# Patient Record
Sex: Female | Born: 1960
Health system: Southern US, Community
[De-identification: ages and names within clinical notes are randomized; demographics above are authoritative.]

## PROBLEM LIST (undated history)

## (undated) DIAGNOSIS — N200 Calculus of kidney: Secondary | ICD-10-CM

## (undated) DIAGNOSIS — I10 Essential (primary) hypertension: Secondary | ICD-10-CM

## (undated) HISTORY — PX: UTERINE FIBROID EMBOLIZATION: SHX825

---

## 1997-11-17 ENCOUNTER — Other Ambulatory Visit: Admission: RE | Admit: 1997-11-17 | Discharge: 1997-11-17 | Payer: Self-pay | Admitting: Obstetrics and Gynecology

## 1998-06-17 ENCOUNTER — Observation Stay (HOSPITAL_COMMUNITY): Admission: EM | Admit: 1998-06-17 | Discharge: 1998-06-19 | Payer: Self-pay | Admitting: Emergency Medicine

## 1998-06-17 ENCOUNTER — Encounter: Payer: Self-pay | Admitting: Urology

## 1999-01-13 ENCOUNTER — Other Ambulatory Visit: Admission: RE | Admit: 1999-01-13 | Discharge: 1999-01-13 | Payer: Self-pay | Admitting: Obstetrics and Gynecology

## 1999-07-19 ENCOUNTER — Inpatient Hospital Stay (HOSPITAL_COMMUNITY): Admission: RE | Admit: 1999-07-19 | Discharge: 1999-07-20 | Payer: Self-pay | Admitting: Orthopedic Surgery

## 1999-07-19 ENCOUNTER — Encounter: Payer: Self-pay | Admitting: Orthopedic Surgery

## 2000-02-16 ENCOUNTER — Observation Stay (HOSPITAL_COMMUNITY): Admission: AD | Admit: 2000-02-16 | Discharge: 2000-02-16 | Payer: Self-pay | Admitting: *Deleted

## 2000-02-28 ENCOUNTER — Observation Stay (HOSPITAL_COMMUNITY): Admission: AD | Admit: 2000-02-28 | Discharge: 2000-02-28 | Payer: Self-pay | Admitting: Obstetrics and Gynecology

## 2000-03-08 ENCOUNTER — Other Ambulatory Visit: Admission: RE | Admit: 2000-03-08 | Discharge: 2000-03-08 | Payer: Self-pay | Admitting: Obstetrics and Gynecology

## 2000-06-29 ENCOUNTER — Inpatient Hospital Stay (HOSPITAL_COMMUNITY): Admission: AD | Admit: 2000-06-29 | Discharge: 2000-06-29 | Payer: Self-pay | Admitting: Obstetrics and Gynecology

## 2000-07-13 ENCOUNTER — Ambulatory Visit (HOSPITAL_COMMUNITY): Admission: RE | Admit: 2000-07-13 | Discharge: 2000-07-13 | Payer: Self-pay | Admitting: Obstetrics and Gynecology

## 2000-09-21 ENCOUNTER — Inpatient Hospital Stay (HOSPITAL_COMMUNITY): Admission: AD | Admit: 2000-09-21 | Discharge: 2000-09-21 | Payer: Self-pay | Admitting: Obstetrics and Gynecology

## 2000-09-27 ENCOUNTER — Inpatient Hospital Stay (HOSPITAL_COMMUNITY): Admission: AD | Admit: 2000-09-27 | Discharge: 2000-09-30 | Payer: Self-pay | Admitting: Obstetrics and Gynecology

## 2000-09-27 ENCOUNTER — Encounter (INDEPENDENT_AMBULATORY_CARE_PROVIDER_SITE_OTHER): Payer: Self-pay

## 2000-10-02 ENCOUNTER — Encounter: Admission: RE | Admit: 2000-10-02 | Discharge: 2000-11-01 | Payer: Self-pay | Admitting: Obstetrics and Gynecology

## 2000-10-26 ENCOUNTER — Other Ambulatory Visit: Admission: RE | Admit: 2000-10-26 | Discharge: 2000-10-26 | Payer: Self-pay | Admitting: Obstetrics and Gynecology

## 2001-11-02 ENCOUNTER — Other Ambulatory Visit: Admission: RE | Admit: 2001-11-02 | Discharge: 2001-11-02 | Payer: Self-pay | Admitting: Obstetrics and Gynecology

## 2003-02-27 ENCOUNTER — Other Ambulatory Visit: Admission: RE | Admit: 2003-02-27 | Discharge: 2003-02-27 | Payer: Self-pay | Admitting: Obstetrics and Gynecology

## 2004-04-19 ENCOUNTER — Other Ambulatory Visit: Admission: RE | Admit: 2004-04-19 | Discharge: 2004-04-19 | Payer: Self-pay | Admitting: Obstetrics and Gynecology

## 2004-08-02 ENCOUNTER — Observation Stay (HOSPITAL_COMMUNITY): Admission: RE | Admit: 2004-08-02 | Discharge: 2004-08-03 | Payer: Self-pay | Admitting: Urology

## 2004-08-02 ENCOUNTER — Emergency Department (HOSPITAL_COMMUNITY): Admission: EM | Admit: 2004-08-02 | Discharge: 2004-08-02 | Payer: Self-pay | Admitting: Emergency Medicine

## 2005-06-20 ENCOUNTER — Ambulatory Visit: Payer: Self-pay | Admitting: Infectious Diseases

## 2005-08-01 ENCOUNTER — Ambulatory Visit: Payer: Self-pay | Admitting: Infectious Diseases

## 2005-10-03 ENCOUNTER — Ambulatory Visit (HOSPITAL_BASED_OUTPATIENT_CLINIC_OR_DEPARTMENT_OTHER): Admission: RE | Admit: 2005-10-03 | Discharge: 2005-10-03 | Payer: Self-pay | Admitting: Urology

## 2005-12-15 ENCOUNTER — Other Ambulatory Visit: Admission: RE | Admit: 2005-12-15 | Discharge: 2005-12-15 | Payer: Self-pay | Admitting: Obstetrics and Gynecology

## 2007-04-03 ENCOUNTER — Encounter (INDEPENDENT_AMBULATORY_CARE_PROVIDER_SITE_OTHER): Payer: Self-pay | Admitting: Obstetrics and Gynecology

## 2007-04-03 ENCOUNTER — Ambulatory Visit (HOSPITAL_COMMUNITY): Admission: RE | Admit: 2007-04-03 | Discharge: 2007-04-03 | Payer: Self-pay | Admitting: Obstetrics and Gynecology

## 2007-04-05 ENCOUNTER — Inpatient Hospital Stay (HOSPITAL_COMMUNITY): Admission: AD | Admit: 2007-04-05 | Discharge: 2007-04-05 | Payer: Self-pay | Admitting: Obstetrics and Gynecology

## 2011-01-11 NOTE — H&P (Signed)
Robin Wilson, Robin Wilson                 ACCOUNT NO.:  [id number]   MEDICAL RECORD NO.:  [id number]          PATIENT TYPE:  AMB   LOCATION:                                FACILITY:  [facility]   PHYSICIAN:  Guy Sandifer. Henderson Cloud, M.D. DATE OF BIRTH:  1961-08-04   DATE OF ADMISSION:  04/03/2007  DATE OF DISCHARGE:                              HISTORY & PHYSICAL   CHIEF COMPLAINT:  Heavy, painful menses.   HISTORY PRESENT ILLNESS:  This patient is a 50 year old married white  female, G2, P2, status post tubal ligation with increasingly heavy and  painful menses.  A trial of the birth control pill has been  unsuccessful.  Ultrasound, in my office, on March 27, 2007 revealed the  uterus measuring 11.5 x 4.5 x 5.9 cm with a 1.8 cm. simple cyst of the  left ovary.  After discussion of options, she is be admitted for  laparoscopy, hysteroscopy, D&C, and NovaSure endometrial ablation.  The  potential risks and complications have been discussed preoperatively.   PAST MEDICAL HISTORY:  1. Mitral valve prolapse.  2. Chronic hypertension.  3. Asthma.  4. Kidney stones.  5. Ruptured disk in 2000.   PAST SURGICAL HISTORY:  1. Tonsillectomy.  2. Wisdom tooth extraction.   OBSTETRIC HISTORY:  Cesarean section x2.   SOCIAL HISTORY:  Denies tobacco, alcohol, or drug abuse.   MEDICATIONS:  Diovan, Xyzal, vitamins, calcium, Ambien p.r.n.   ALLERGIES:  SULFA.   FAMILY HISTORY:  Chronic hypertension in mother.   REVIEW OF SYSTEMS:  NEUROLOGIC:  Denies headache.  CARDIAC:  Denies  chest pain.  PULMONARY:  Denies shortness of breath.  GI:  Denies recent  changes in bowel habits.   PHYSICAL EXAMINATION:  VITAL SIGNS:  Height 5 feet 9 inches.  Weight 121  pounds.  Blood pressure 114/70.  GENERAL:  Without thyromegaly.  LUNGS:  Clear to auscultation.  HEART:  Regular rate and rhythm.  BACK:  Without CVA tenderness.  BREASTS: Without masses or discharge.  ABDOMEN:  Soft, nontender, without masses.  PELVIC  EXAM:  Vulva, vagina, cervix without lesions.  Uterus upper  normal size mobile, nontender.  She is tender over the uterosacral  ligaments with no nodularity palpable.  Adnexa nontender without masses.  EXTREMITIES:  Grossly within normal limits.  NEUROLOGIC EXAM: Grossly within normal limits.   ASSESSMENT:  Menorrhagia/dysmenorrhea.   PLAN:  Laparoscopy, hysteroscopy, D&C and NovaSure endometrial ablation.      Guy Sandifer Henderson Cloud, M.D.  Electronically Signed     JET/MEDQ  D:  03/27/2007  T:  03/27/2007  Job:  [id number]

## 2011-01-11 NOTE — Op Note (Signed)
NAMELADAVIA, Robin Wilson                 ACCOUNT NO.:  [id number]   MEDICAL RECORD NO.:  [id number]          PATIENT TYPE:  AMB   LOCATION:  [facility]                           FACILITY:  [facility]   PHYSICIAN:  Guy Sandifer. Henderson Cloud, M.D. DATE OF BIRTH:  02-May-1961   DATE OF PROCEDURE:  04/03/2007  DATE OF DISCHARGE:                               OPERATIVE REPORT   PREOPERATIVE DIAGNOSIS:  Menorrhagia, dysmenorrhea.   POSTOPERATIVE DIAGNOSIS:  Menorrhagia, dysmenorrhea.   PROCEDURE:  NovaSure endometrial ablation, hysteroscopy, dilatation and  curettage, laparoscopy, 1% Xylocaine paracervical block.   SURGEON:  Guy Sandifer. Henderson Cloud, M.D.   ANESTHESIA:  General with endotracheal intubation.   SPECIMENS:  Endometrial curettings to pathology.   ESTIMATED BLOOD LOSS:  Minimal.   IN AND OUT:  Distending media 5 mL deficit.   INDICATIONS AND CONSENT:  This patient is a 50 year old white female  status post tubal ligation with increasingly heavy and painful menses.  She is being admitted for hysteroscopy, D&C, NovaSure endometrial  ablation and laparoscopy.  Potential risks and complications have been  discussed preoperatively including but limited to infection, organ  damage, uterine perforation, bleeding requiring transfusion of blood  products with HIV and hepatitis acquisition, DVT, PE, pneumonia.  Recurrent pelvic pain and/or heavy irregular bleeding has also been  discussed.  All questions are answered and consent is signed on the  chart.   FINDINGS:  Endometrial cavity is without abnormal structure.  Fallopian  tube ostia are identified bilaterally.  Upper abdomen is grossly normal.  Uterus is 8 weeks in size.  Anterior posterior cul-de-sacs were normal.  Ovaries are normal.  Fallopian tubes status post ligation bilaterally.   PROCEDURE:  The patient taken to operating room where she is identified,  placed in dorsal supine position and general anesthesia is induced via  endotracheal intubation.   She is then placed in dorsal lithotomy  position where she is prepped abdominally and vaginally.  The bladder is  straight catheterized and she is draped in a sterile fashion.  Bivalve  speculum was placed in the vagina and the anterior cervical lip was  injected with 1% plain Xylocaine and grasped with single-tooth  tenaculum.  Paracervical block was placed at 2, 4, 5, 7, 8 and 10  o'clock positions with approximately 20 mL total of 1% plain Xylocaine.  Uterus sounds to 10 cm and the endocervical canal measures 4 cm.  Cervix  was gently progressively dilated to a 27 dilator.  Diagnostic  hysteroscope was placed in the endocervical canal and advanced under  direct visualization using distending media.  The above findings noted.  Hysteroscope was withdrawn.  Sharp curettage is carried out.  The  NovaSure endometrial ablation device is then placed.  The cavity test  was passed on the first attempt.  Ablation was then carried out.  The  device is then removed and seen to be intact.  The hysteroscope was then  replaced in the endocervical canal and again advanced under direct  visualization.  Good cautery effect throughout the cavity is noted.  No  evidence of perforations noted.  Hysteroscope is withdrawn.  The single-  tooth tenaculum was replaced with a Hulka tenaculum and attention is  turned to the abdomen.  The suprapubic and infraumbilical areas  are  injected in the midline with 0.5plain Marcaine.  A small infraumbilical  incision is then made.  A disposable Veress needle was then placed on  the first attempt without difficulty.  A normal syringe and drop test  are noted.  Two liters of gas are then insufflated under low pressure  with good tympany in the right upper quadrant.  Veress needle is removed  and a 10/11 XL bladeless disposable trocar sleeve was placed using  direct visualization with the diagnostic laparoscopic.  After placement  the operative laparoscope was placed and a  small suprapubic incision is  made in the midline and a 5-mm XL bladeless disposable trocar sleeve was  placed under direct visualization without difficulty.  The above  findings are noted.  The suprapubic trocar sleeve is then removed and no  bleeding is noted.  Pneumoperitoneum is completely reduce and the  umbilical trocar sleeve was removed.  0 Vicryl suture is used to  reapproximate the subcutaneous tissue in the umbilical incision with  care being taken not to pick up any underlying structures.  Skin is  closed on both incisions with Dermabond.  Hulka tenaculum is removed.  All counts were correct.  The patient is awakened, taken to recovery  room in stable condition.      Guy Sandifer Henderson Cloud, M.D.  Electronically Signed     JET/MEDQ  D:  04/03/2007  T:  04/03/2007  Job:  [id number]

## 2011-01-11 NOTE — H&P (Signed)
NAMEDEYANNA, Robin Wilson                 ACCOUNT NO.:  [id number]   MEDICAL RECORD NO.:  [id number]          PATIENT TYPE:  AMB   LOCATION:  [facility]C                           [facility]ILITY[facility]  [facility]   PHYSICIAN:  Guy Sandifer. Henderson Cloud, M.D. DATE OF BIRTH:  Nov 05, 1960   DATE OF ADMISSION:  04/03/2007  DATE OF DISCHARGE:                              HISTORY & PHYSICAL   CHIEF COMPLAINT:  Heavy, painful menses.   HISTORY OF PRESENT ILLNESS:  This patient is a 50 year old married white  female G2, P2, status post tubal ligation with increasingly heavy and  painful menses.  She has attempted a trial of the birth control pill  which has not been helpful.  Ultrasound in my office on March 27, 2007,  reveals uterus measuring 11.5 x 4.5 x 5.9 cm.  There is a 1.8 cm simple  cyst on the left ovary.  After a discussion of options, she is being  admitted for a laparoscopy, hysteroscopy, D&C and NovaSure endometrial  ablation.  Potential risks and complications have been discussed  preoperatively.   PAST MEDICAL HISTORY:  1. Chronic hypertension.  2. Mitral valve prolapse.  3. Asthma.  4. Ruptured disc 2000.   PAST SURGICAL HISTORY:  Dictation stopped at this point.      Guy Sandifer Henderson Cloud, M.D.  Electronically Signed     JET/MEDQ  D:  03/27/2007  T:  03/27/2007  Job:  [id number]

## 2011-01-14 NOTE — Op Note (Signed)
NAMECLIFFIE, Robin Wilson                 ACCOUNT NO.:  [id number]   MEDICAL RECORD NO.:  [id number]          PATIENT TYPE:  AMB   LOCATION:  [facility]                         FACILITY:  [facility]   PHYSICIAN:  Sigmund I. Patsi Sears, M.D.DATE OF BIRTH:  05-Jun-1961   DATE OF PROCEDURE:  10/03/2005  DATE OF DISCHARGE:                                 OPERATIVE REPORT   PREOPERATIVE DIAGNOSIS:  Impacted lower left ureteral calculus.   POSTOPERATIVE DIAGNOSIS:  Impacted lower left ureteral calculus.   OPERATION:  Cystourethroscopy, retrograde pyelogram with interpretation,  dilation of left ureter, ureteroscopy, laser fractionation of impacted left  lower ureteral calculus, and basket extraction of stone.   SURGEON:  Sigmund I. Patsi Sears, M.D.   ANESTHESIA:  General LMA.   PREPARATION:  After appropriate preanesthesia, the patient was brought to  the operating room and placed on the operating table in a dorsal supine  position, where general LMA anesthesia was introduced.  She was then  replaced in dorsal lithotomy position, and the pubis was prepped with  Betadine solution and draped in the usual fashion.   HISTORY:  This 50 year old female presents with a two-week history of left  lower ureteral colic, with known bilateral small renal stones.  CT showed a  stone in the left lower ureter, measuring 4 mm, and the patient has had  continue ureteral colic, now for extraction.   PROCEDURE:  Cystoscopy was accomplished, which showed a normal-appearing  bladder, with normal urethra.  There was no evidence of bladder stone, tumor  or bladder diverticular formation.   Retrograde pyelogram was performed on the left side, which showed a left  lower ureteral calculus, with mild hydronephrosis above it.  A guidewire was  passed in the kidney, and the ureter was dilated with a ureteral dilation  sheath.  Following this, the short 6 French ureteroscope was passed into the  ureter, the stone was identified.   It was too big to come big the ureter,  and it appeared to be three small stones which had molded together to form  one stone.  The laser was then used to fracture the stone into two parts,  and the large part was removed from the stone.  Some flakes of the stone  material were still left in the ureter.  Repeat ureteroscopy did not show  any further stone.  It was elected to not leave a double J catheter, and the  guidewire was removed.  The patient was given IV Toradol, awakened and taken  to the recovery room in good condition.      Sigmund I. Patsi Sears, M.D.  Electronically Signed     SIT/MEDQ  D:  10/03/2005  T:  10/03/2005  Job:  [id number]

## 2011-01-14 NOTE — H&P (Signed)
[facility]  Patient:    Robin Wilson, Robin Wilson                          MRN: [id number] Adm. Date:  09/27/00 Attending:  Guy Sandifer. Arleta Creek, M.D.                         History and Physical  CHIEF COMPLAINT:              Term pregnancy, for repeat cesarean section.  HISTORY OF PRESENT ILLNESS:   This patient is a 50 year old married white female, G2, P27, with an EDC of October 04, 2000, established by an ultrasound at 8-5/7 weeks.  Has had a pregnancy complicated by: 1. Advanced maternal age with a normal AFP. 2. History of cesarean section for breech presentation.  The patient has also    had repair of a ruptured disk since that time and requests repeat cesarean    section.  She is being admitted for this surgery.  PAST MEDICAL HISTORY:         See [place] form.  PAST SURGICAL HISTORY:        See [place] form.  FAMILY HISTORY:               See [place] form.  OBSTETRICAL HISTORY:          See [place] form.  MEDICATIONS:                  Prenatal vitamin.  ALLERGIES:                    No known drug allergies.  PHYSICAL EXAMINATION:  LUNGS:                        Clear to auscultation.  HEART:                        Regular rate and rhythm.  BACK:                         Without CVA tenderness.  BREASTS:                      Not examined.  ABDOMEN:                      Gravid with no epigastric tenderness.  PELVIC:                       Cervix closed, thick, and high on last examination.  EXTREMITIES:                  Trace edema.  LABORATORY DATA:              Blood type O negative with negative antibody screen.  She did receive RhoGAM.  Toxoplasmosis screen negative.  VDRL nonreactive.  Rubella titer positive.  Hepatitis B surface antigen negative. Gonorrhea and chlamydia screen negative.  ASSESSMENT:                   Term intrauterine pregnancy.  PLAN:                         Repeat cesarean section. DD:  09/26/00 TD:   09/26/00  Job: [id number]

## 2011-01-14 NOTE — Op Note (Signed)
[facility]  Patient:    Robin Wilson, Robin Wilson                        MRN: [id number] Proc. Date: 09/27/00 Adm. Date:  [id number] Attending:  Cordelia Pen Ii                           Operative Report  PREOPERATIVE DIAGNOSES:       1. Intrauterine pregnancy at term.                               2. Previous cesarean section.                               3. Status post low back surgery.  POSTOPERATIVE DIAGNOSES:      1. Intrauterine pregnancy at term.                               2. Previous cesarean section.                               3. Status post low back surgery.  PROCEDURE:                    Low transverse cesarean section with bilateral tubal ligation.  SURGEON:                      Guy Sandifer. Arleta Creek, M.D.  ASSISTANTWilley Blade, M.D.  ANESTHESIA:                   Spinal.  ANESTHESIOLOGIST:             Belva Agee, M.D.  ESTIMATED BLOOD LOSS:         600 cc.  FINDINGS:                     Viable female infant with Apgars of 8 and 9 at one and five minutes respectively.  Birth weight 8 lb 15 oz.  Arterial cord pH 7.18.  INDICATIONS AND CONSENT:      This patient is a 50 year old married white female, G2, P1, with a history of a cesarean section for breech baby.  She subsequently had a lumbar laminectomy.  After consideration of the risks of VBAC, as well as the potential risks of damaging her back again after her surgery with second stage of labor, cesarean section is scheduled.  The potential risks and complications have been discussed including but not limited to infection; bowel, bladder or ureteral damage; bleeding requiring transfusion of blood products with possible transfusion reaction, HIV and hepatitis acquisition; DVT; PE and pneumonia.  The patient also requested tubal ligation.  Permanence, failure rate and increased ectopic risks of the procedure were discussed.  All questions were  answered.  DESCRIPTION OF PROCEDURE:     The patient was taken to the operating room, where a spinal anesthetic was placed.  She was then placed in the dorsal supine position with a 15 degree left lateral wedge.  She was prepped.  A Foley catheter was placed in the bladder to drain.  She was draped in a sterile fashion.  After testing for adequate spinal anesthesia, the skin was entered through the previous Pfannenstiel scar and dissection was carried out in layers to the peritoneum.  The peritoneum was incised and extended superiorly and inferiorly.  The vesicouterine peritoneum was taken down cephalolaterally.  A bladder flap was developed and a bladder blade was placed.  The uterus was incised in a low transverse manner.  The uterine cavity was entered bluntly with a Kelly clamp.  The uterine incision was extended cephalolaterally with the fingers.  Artificial rupture of membranes with clear fluid.  The vertex was then delivered.  The oral and nasopharynx were suction.  The remainder of the infant was delivered.  The cord was clamped and cut and the infant was handed to the awaiting pediatrics team. The placenta was manually delivered.  The internal uterine contour was normal. The uterus was closed in a running locking layer of 0 Monocryl suture, which achieved good hemostasis.  The left fallopian tube was identified from the cornu to the fimbria.  A knuckle of mid ampullary fallopian tube was grasped with a Babcock clamp.  The knuckle was doubly ligated with 0 plain suture. The intervening knuckle of tissue was then resected.  Cautery was used only enough to obtain hemostasis.  A similar procedure was carried out on the right fallopian tube.  The ovaries were normal.  Irrigation was carried out.  The anterior peritoneum was then closed in a running fashion with 0 Monocryl suture, which was also used to reapproximate the pyramidalis muscle in the midline.  The anterior rectus fascia was  closed in running fashion with 0 Panacryl suture.  The skin was closed with clips.  All sponge, needle and instrument counts were correct.  The patient was transferred to the recovery room in stable condition. DD:  09/27/00 TD:  09/27/00 Job: [id number]

## 2011-01-14 NOTE — H&P (Signed)
[facility]  Patient:    Robin Wilson                         MRN: [id number] Adm. Date:  [id number] Attending:  Alinda Deem                         History and Physical  HISTORY OF PRESENT ILLNESS:  A 50 year old woman who sustained an extruded L5-S1 herniated nucleus pulposus last week with immediate onset of severe right leg and low back pain and developed muscle weakness over the first 3 days I was treating her to the point where she could no longer do a toe raise on the right side. She also lost her S1 reflex.  The diagnosis is proven by an MRI scan that was accomplished when I met her over a week ago.  My physical therapist Clelia Croft worked with her for a couple of days and was unable to centralize the pain and reported back that his opinion is that it truly was an extruded disk which of course was confirmed by the MRI scan.  She has also been treated with Percocet which has barely controlled her pain and a Medrol Dosepak which did not seem to  help much at all.  The MRI scan does show an oppressive right-sided extruded L5-S1 herniated nucleus pulposus.  Because of the severity of the pain and the muscle  weakness which did not get better over the weekend operative intervention is desired by the patient.  Risks and benefits are understood.  PAST MEDICAL HISTORY:  No allergies.  She does not smoke.  She rarely drinks. he has been admitted to the hospital twice for kidney stones in the past.  She has had a cesarean section and a ______ procedure.  No problem with anesthesia.  The only medicines she takes is as previously mentioned.  FAMILY HISTORY:  She lives with her husband here in [place] who works as an Acupuncturist for [employer].  REVIEW OF SYSTEMS:  She has a hiatal hernia which she rarely takes any medicine  for.  She also has what sounds like exertional asthma and has a Ventolin inhaler but  has not used it for the last 8 months.  PHYSICAL EXAMINATION:  GENERAL:  Well-nourished, well-developed, slender 50 year old woman.  VITAL SIGNS:  Pulse 84, blood pressure 150/80, temperature 98.4, respiratory rate 16.  HEENT:  Tympanic membranes are clear.  Pupils PERRLA.  Full extraocular range of motion.  Nose and throat clear.  NECK:  Supple.  CHEST:  Clear.  HEART:  Regular rate and rhythm.  ABDOMEN:  Abdomen is soft and nontender.  BACK:  Clinically straight.  She has paralumbar muscle spasm.  EXTREMITIES:  Positive straight leg raise on the right side.  Gastrocnemius weakness on the right at 4/5 and loss of the S1 reflex on the right side.  EHL nd anterior tibialis are intact.  She has normal pulses.  Straight leg raising positive on the right side.  ASSESSMENT:  Extruded L5-S1 herniated nucleus pulposus with muscle weakness in  50 year old woman for the last week.  PLAN:  She will be taken to the operating room for a lumbar laminotomy and resection of the herniated nucleus pulposus.  This will be accomplished as soon as the room is available today.  She is in ______. DD:  07/19/99 TD:  07/19/99 Job: [id number]

## 2011-01-14 NOTE — Discharge Summary (Signed)
[facility]  Patient:    Robin Wilson, Robin Wilson                        MRN: [id number] Adm. Date:  [id number] Disc. Date: [id number] Attending:  Cordelia Pen Ii Dictator:   Danie Chandler, R.N.                           Discharge Summary  ADMITTING DIAGNOSES:          1. Intrauterine pregnancy at term.                               2. Previous cesarean section.                               3. Status post low back surgery.  DISCHARGE DIAGNOSES:          1. Intrauterine pregnancy at term.                               2. Previous cesarean section.                               3. Status post low back surgery.  PROCEDURE:                    On September 27, 2000 low transverse cesarean section with bilateral tubal ligation.  REASON FOR ADMISSION:         Please see dictated H&P.  HOSPITAL COURSE:              The patient was taken to the operating room and underwent the above named procedure without complication.  This was productive of a viable female infant with Apgars of 8 at one minute and 9 at five minutes and an arterial cord pH of 7.19.  Postoperatively on day #1 the patients hemoglobin was 10.9, hematocrit 30.6, and white blood cell count 15.1.  She had a good return of bowel function on this day and was tolerating a regular diet.  On postoperative day #2 she was ambulating well without difficulty and had good pain control.  She was discharged home on postoperative day #3.  CONDITION ON DISCHARGE:       Good.  DIET:                         Regular, as tolerated.  ACTIVITY:                     No heavy lifting, no driving, no vaginal entry.  FOLLOW-UP:                    She is to follow-up in the office in one to two weeks for incision check.  She is to call for temperature greater than 100 degrees, persistent nausea or vomiting, heavy vaginal bleeding, and/or redness or drainage from the incision site.  DISCHARGE MEDICATIONS:        1.  Prenatal vitamin one p.o. q.d.  2. Percocet 5 mg as directed by M.D.                               3. Motrin 600 mg as directed by M.D. DD:  10/24/00 TD:  10/24/00 Job: [id number]

## 2011-06-13 LAB — COMPREHENSIVE METABOLIC PANEL
ALT: 15
AST: 22
Albumin: 4.2
Alkaline Phosphatase: 49
BUN: 11
CO2: 29
Calcium: 9.4
Chloride: 104
Creatinine, Ser: 0.51
GFR calc Af Amer: >60
GFR calc non Af Amer: >60
Glucose, Bld: 91
Potassium: 3.7
Sodium: 139
Total Bilirubin: 0.6
Total Protein: 6.4

## 2011-06-13 LAB — CBC
HCT: 37.6
Hemoglobin: 13.1
MCHC: 34.9
MCV: 100.3 — ABNORMAL HIGH
Platelets: 274
RBC: 3.75 — ABNORMAL LOW
RDW: 12.4
WBC: 4.1

## 2011-08-10 ENCOUNTER — Encounter: Payer: Self-pay | Admitting: Emergency Medicine

## 2011-08-10 ENCOUNTER — Emergency Department (HOSPITAL_COMMUNITY)
Admission: EM | Admit: 2011-08-10 | Discharge: 2011-08-10 | Discharge status: Against Medical Advice | Payer: BC Managed Care – PPO | Attending: Emergency Medicine | Admitting: Emergency Medicine

## 2011-08-10 DIAGNOSIS — R1110 Vomiting, unspecified: Secondary | ICD-10-CM | POA: Insufficient documentation

## 2011-08-10 DIAGNOSIS — R109 Unspecified abdominal pain: Secondary | ICD-10-CM | POA: Insufficient documentation

## 2011-08-10 HISTORY — DX: Essential (primary) hypertension: I10

## 2011-08-10 HISTORY — DX: Calculus of kidney: N20.0

## 2011-08-10 MED ORDER — ONDANSETRON 8 MG PO TBDP
8.0000 mg | ORAL_TABLET | Freq: Once | ORAL | Status: AC
  Administered 2011-08-10: 8 mg via ORAL
  Filled 2011-08-10: qty 1

## 2011-08-10 NOTE — ED Notes (Signed)
Pt returned to registration window asking about wait, delay explained, pt verbally abusive to [employer] tech at window, states she will not wait much longer. Apology for delay given.

## 2011-08-10 NOTE — ED Notes (Signed)
Pt returned to window and states she is leaving without being seen by MD, requesting to know name of RN who states she is being discharged. Explained to patient that she is leaving without being seen and not discharge and explained that we would be happy to see her when room is available, delay explained again, pt verbally abusive and cussing at staff. States she will be writing a letter to complain about our "poor time management". Explained to patient that hospital is full but we hoped to get her a bed soon, pt states she would rather leave. Explained to patient that care is available to her if she should return.

## 2011-08-10 NOTE — ED Notes (Signed)
Per Pt, hx of kidney stones, seen by MD Patsi Sears; onset of left flank pain today with nausea and vomiting, pain has moved to front left lower abdomen; denies urinary symptoms.

## 2012-05-14 DIAGNOSIS — L209 Atopic dermatitis, unspecified: Secondary | ICD-10-CM | POA: Insufficient documentation

## 2012-05-22 ENCOUNTER — Other Ambulatory Visit: Payer: Self-pay | Admitting: Urology

## 2012-06-01 ENCOUNTER — Other Ambulatory Visit: Payer: Self-pay | Admitting: Urology

## 2012-06-01 NOTE — Progress Notes (Signed)
PT GIVEN SAME DAY SURGERY INSTRUCTIONS-INCLUDING SHOWER WITH BETASEPT SOAP 2 NIGHTS BEFORE SURGERY AND IF SHOWERING DAY OF SURGERY--USE BETASEPT.  DO NOT USE BETASEPT ON FACE, HEAD OR PRIVATE AREAS-MAY USE NORMAL SOAP / SHAMPOO THOSE AREAS.  DO NOT SHAVE SKIN 48 HRS BEFORE USING BETASEPT TO AVOID SKIN IRRITATION.

## 2012-06-04 ENCOUNTER — Encounter (HOSPITAL_COMMUNITY): Payer: Self-pay | Admitting: *Deleted

## 2012-06-04 ENCOUNTER — Encounter (HOSPITAL_COMMUNITY): Payer: Self-pay | Admitting: Anesthesiology

## 2012-06-04 ENCOUNTER — Ambulatory Visit (HOSPITAL_COMMUNITY): Admission: RE | Admit: 2012-06-04 | Payer: BC Managed Care – PPO | Source: Ambulatory Visit | Admitting: Urology

## 2012-06-04 ENCOUNTER — Ambulatory Visit (HOSPITAL_COMMUNITY): Payer: BC Managed Care – PPO | Admitting: Anesthesiology

## 2012-06-04 ENCOUNTER — Encounter (HOSPITAL_COMMUNITY)
Admission: RE | Disposition: A | Discharge status: Home / Self Care | Payer: Self-pay | Source: Ambulatory Visit | Attending: Urology

## 2012-06-04 ENCOUNTER — Ambulatory Visit (HOSPITAL_COMMUNITY): Payer: BC Managed Care – PPO

## 2012-06-04 ENCOUNTER — Ambulatory Visit (HOSPITAL_COMMUNITY)
Admission: RE | Admit: 2012-06-04 | Discharge: 2012-06-04 | Disposition: A | Discharge status: Home / Self Care | Payer: BC Managed Care – PPO | Source: Ambulatory Visit | Attending: Urology | Admitting: Urology

## 2012-06-04 DIAGNOSIS — M949 Disorder of cartilage, unspecified: Secondary | ICD-10-CM | POA: Insufficient documentation

## 2012-06-04 DIAGNOSIS — Z79899 Other long term (current) drug therapy: Secondary | ICD-10-CM | POA: Insufficient documentation

## 2012-06-04 DIAGNOSIS — N201 Calculus of ureter: Secondary | ICD-10-CM | POA: Insufficient documentation

## 2012-06-04 DIAGNOSIS — M899 Disorder of bone, unspecified: Secondary | ICD-10-CM | POA: Insufficient documentation

## 2012-06-04 HISTORY — PX: CYSTOSCOPY/RETROGRADE/URETEROSCOPY: SHX5316

## 2012-06-04 LAB — BASIC METABOLIC PANEL
BUN: 15 mg/dL (ref 6–23)
CO2: 30 mEq/L (ref 19–32)
Calcium: 9.1 mg/dL (ref 8.4–10.5)
Chloride: 102 mEq/L (ref 96–112)
Creatinine, Ser: 0.6 mg/dL (ref 0.50–1.10)
GFR calc Af Amer: >90 mL/min (ref >90)
GFR calc non Af Amer: >90 mL/min (ref >90)
Glucose, Bld: 83 mg/dL (ref 70–99)
Potassium: 3.3 mEq/L — ABNORMAL LOW (ref 3.5–5.1)
Sodium: 142 mEq/L (ref 135–145)

## 2012-06-04 LAB — CBC
HCT: 35.1 % — ABNORMAL LOW (ref 36.0–46.0)
Hemoglobin: 12.4 g/dL (ref 12.0–15.0)
MCH: 33.8 pg (ref 26.0–34.0)
MCHC: 35.3 g/dL (ref 30.0–36.0)
MCV: 95.6 fL (ref 78.0–100.0)
Platelets: 272 10*3/uL (ref 150–400)
RBC: 3.67 MIL/uL — ABNORMAL LOW (ref 3.87–5.11)
RDW: 12.7 % (ref 11.5–15.5)
WBC: 5 10*3/uL (ref 4.0–10.5)

## 2012-06-04 LAB — SURGICAL PCR SCREEN
MRSA, PCR: POSITIVE — AB
Staphylococcus aureus: POSITIVE — AB

## 2012-06-04 SURGERY — CYSTOSCOPY/RETROGRADE/URETEROSCOPY
Anesthesia: General | Site: Ureter | Laterality: Left | Wound class: Clean Contaminated

## 2012-06-04 SURGERY — LITHOTRIPSY, ESWL
Anesthesia: LOCAL | Laterality: Left

## 2012-06-04 MED ORDER — MUPIROCIN 2 % EX OINT
TOPICAL_OINTMENT | Freq: Two times a day (BID) | CUTANEOUS | Status: DC
  Filled 2012-06-04: qty 22

## 2012-06-04 MED ORDER — HYDROMORPHONE HCL PF 1 MG/ML IJ SOLN: 0.2500 mg | INTRAMUSCULAR | Status: DC | PRN

## 2012-06-04 MED ORDER — OXYCODONE HCL 5 MG/5ML PO SOLN
5.0000 mg | Freq: Once | ORAL | Status: DC | PRN
  Filled 2012-06-04: qty 5

## 2012-06-04 MED ORDER — IOHEXOL 300 MG/ML  SOLN
INTRAMUSCULAR | Status: DC | PRN
  Administered 2012-06-04: 4 mL

## 2012-06-04 MED ORDER — OXYCODONE HCL 5 MG PO TABS: 5.0000 mg | ORAL_TABLET | Freq: Once | ORAL | Status: DC | PRN

## 2012-06-04 MED ORDER — ACETAMINOPHEN 10 MG/ML IV SOLN: 1000.0000 mg | Freq: Once | INTRAVENOUS | Status: DC | PRN

## 2012-06-04 MED ORDER — LIDOCAINE HCL (CARDIAC) 20 MG/ML IV SOLN
INTRAVENOUS | Status: DC | PRN
  Administered 2012-06-04: 50 mg via INTRAVENOUS

## 2012-06-04 MED ORDER — HYDROCODONE-ACETAMINOPHEN 7.5-650 MG PO TABS: 1.0000 | ORAL_TABLET | Freq: Four times a day (QID) | ORAL | Status: DC | PRN

## 2012-06-04 MED ORDER — EPHEDRINE SULFATE 50 MG/ML IJ SOLN
INTRAMUSCULAR | Status: DC | PRN
  Administered 2012-06-04: 5 mg via INTRAVENOUS

## 2012-06-04 MED ORDER — KETOROLAC TROMETHAMINE 30 MG/ML IJ SOLN
INTRAMUSCULAR | Status: DC | PRN
  Administered 2012-06-04: 30 mg via INTRAVENOUS

## 2012-06-04 MED ORDER — MEPERIDINE HCL 50 MG/ML IJ SOLN: 6.2500 mg | INTRAMUSCULAR | Status: DC | PRN

## 2012-06-04 MED ORDER — LACTATED RINGERS IV SOLN
INTRAVENOUS | Status: DC
  Administered 2012-06-04: 1000 mL via INTRAVENOUS

## 2012-06-04 MED ORDER — PROMETHAZINE HCL 25 MG/ML IJ SOLN: 6.2500 mg | INTRAMUSCULAR | Status: DC | PRN

## 2012-06-04 MED ORDER — LIDOCAINE HCL 2 % EX GEL
CUTANEOUS | Status: AC
  Filled 2012-06-04: qty 10

## 2012-06-04 MED ORDER — CEFAZOLIN SODIUM-DEXTROSE 2-3 GM-% IV SOLR
INTRAVENOUS | Status: AC
  Filled 2012-06-04: qty 50

## 2012-06-04 MED ORDER — CEFAZOLIN SODIUM-DEXTROSE 2-3 GM-% IV SOLR
2.0000 g | INTRAVENOUS | Status: AC
  Administered 2012-06-04: 2 g via INTRAVENOUS

## 2012-06-04 MED ORDER — CIPROFLOXACIN HCL 500 MG PO TABS: 500.0000 mg | ORAL_TABLET | Freq: Two times a day (BID) | ORAL | Status: DC

## 2012-06-04 MED ORDER — IOHEXOL 300 MG/ML  SOLN
INTRAMUSCULAR | Status: AC
  Filled 2012-06-04: qty 1

## 2012-06-04 MED ORDER — FENTANYL CITRATE 0.05 MG/ML IJ SOLN
INTRAMUSCULAR | Status: DC | PRN
  Administered 2012-06-04: 25 ug via INTRAVENOUS
  Administered 2012-06-04: 50 ug via INTRAVENOUS
  Administered 2012-06-04: 25 ug via INTRAVENOUS

## 2012-06-04 MED ORDER — PHENAZOPYRIDINE HCL 200 MG PO TABS: 200.0000 mg | ORAL_TABLET | Freq: Three times a day (TID) | ORAL | Status: DC | PRN

## 2012-06-04 MED ORDER — SCOPOLAMINE 1 MG/3DAYS TD PT72
MEDICATED_PATCH | TRANSDERMAL | Status: AC
  Filled 2012-06-04: qty 1

## 2012-06-04 MED ORDER — ACETAMINOPHEN 10 MG/ML IV SOLN
INTRAVENOUS | Status: AC
  Filled 2012-06-04: qty 100

## 2012-06-04 MED ORDER — MIDAZOLAM HCL 5 MG/5ML IJ SOLN
INTRAMUSCULAR | Status: DC | PRN
  Administered 2012-06-04: 1 mg via INTRAVENOUS

## 2012-06-04 MED ORDER — PROPOFOL 10 MG/ML IV EMUL
INTRAVENOUS | Status: DC | PRN
  Administered 2012-06-04: 30 mg via INTRAVENOUS
  Administered 2012-06-04 (×2): 50 mg via INTRAVENOUS
  Administered 2012-06-04: 120 mg via INTRAVENOUS
  Administered 2012-06-04: 50 mg via INTRAVENOUS

## 2012-06-04 MED ORDER — ACETAMINOPHEN 10 MG/ML IV SOLN
INTRAVENOUS | Status: DC | PRN
  Administered 2012-06-04: 1000 mg via INTRAVENOUS

## 2012-06-04 MED ORDER — LACTATED RINGERS IV SOLN
INTRAVENOUS | Status: DC | PRN
  Administered 2012-06-04 (×2): via INTRAVENOUS

## 2012-06-04 MED ORDER — BELLADONNA ALKALOIDS-OPIUM 16.2-60 MG RE SUPP
RECTAL | Status: AC
  Filled 2012-06-04: qty 1

## 2012-06-04 MED ORDER — SCOPOLAMINE 1 MG/3DAYS TD PT72
1.0000 | MEDICATED_PATCH | TRANSDERMAL | Status: DC
  Administered 2012-06-04: 1.5 mg via TRANSDERMAL
  Filled 2012-06-04: qty 1

## 2012-06-04 MED ORDER — ONDANSETRON HCL 4 MG/2ML IJ SOLN
INTRAMUSCULAR | Status: DC | PRN
  Administered 2012-06-04: 4 mg via INTRAVENOUS

## 2012-06-04 MED ORDER — BELLADONNA ALKALOIDS-OPIUM 16.2-60 MG RE SUPP
RECTAL | Status: DC | PRN
  Administered 2012-06-04: 1 via RECTAL

## 2012-06-04 SURGICAL SUPPLY — 23 items
ADAPTER CATH URET PLST 4-6FR (CATHETERS) ×2 IMPLANT
ADPR CATH URET STRL DISP 4-6FR (CATHETERS) ×1
BAG URO CATCHER STRL LF (DRAPE) ×2 IMPLANT
BASKET ZERO TIP NITINOL 2.4FR (BASKET) ×2 IMPLANT
BSKT STON RTRVL ZERO TP 2.4FR (BASKET) ×2
CATH CLEAR GEL 3F BACKSTOP (CATHETERS) ×2 IMPLANT
CATH INTERMIT  6FR 70CM (CATHETERS) ×2 IMPLANT
CATH URET 5FR 28IN OPEN ENDED (CATHETERS) ×1 IMPLANT
CLOTH BEACON ORANGE TIMEOUT ST (SAFETY) ×2 IMPLANT
DRAPE CAMERA CLOSED 9X96 (DRAPES) ×2 IMPLANT
GLOVE BIOGEL M STRL SZ7.5 (GLOVE) ×2 IMPLANT
GLOVE SURG SS PI 8.0 STRL IVOR (GLOVE) ×1 IMPLANT
GOWN PREVENTION PLUS XLARGE (GOWN DISPOSABLE) ×3 IMPLANT
GOWN STRL NON-REIN LRG LVL3 (GOWN DISPOSABLE) ×2 IMPLANT
GOWN STRL REIN XL XLG (GOWN DISPOSABLE) ×2 IMPLANT
GUIDEWIRE STR DUAL SENSOR (WIRE) ×2 IMPLANT
LASER FIBER DISP (UROLOGICAL SUPPLIES) ×1 IMPLANT
MANIFOLD NEPTUNE II (INSTRUMENTS) ×2 IMPLANT
MARKER SKIN DUAL TIP RULER LAB (MISCELLANEOUS) ×1 IMPLANT
PACK CYSTO (CUSTOM PROCEDURE TRAY) ×2 IMPLANT
SCRUB PCMX 4 OZ (MISCELLANEOUS) ×2 IMPLANT
SYRINGE IRR TOOMEY STRL 70CC (SYRINGE) ×1 IMPLANT
TUBING CONNECTING 10 (TUBING) ×2 IMPLANT

## 2012-06-04 NOTE — Progress Notes (Signed)
PACU Nursing Note: Pt fully alert and oriented x 4, able to ambulate to bathroom w/o assistance, pt denies any pain or aching, some urgency. DC instructions review w/ pt and significant other, pt teaching done (1) Rx's-safety while taking pain med, (2) Rx- importance of completing ABX, (3) When to call MD (4) Post diet/fluid intake after arriving home, (5) Importance of hand washing, (6) resting for 24 hours per recommendation of Anesthesia Department. Pt and significant other given opportunity for questions throughout dc instructions given. Pt able to state back significant instructions and again opportunity for questions again provided. Pt placed in wheelchair and escorted to exit with PACU RN. Assisted into private vehicle.

## 2012-06-04 NOTE — Anesthesia Preprocedure Evaluation (Addendum)
Anesthesia Evaluation  Patient identified by MRN, date of birth, ID band Patient awake    Reviewed: Allergy & Precautions, H&P , NPO status , Patient's Chart, lab work & pertinent test results  Airway Mallampati: II TM Distance: >3 FB Neck ROM: Full    Dental  (+) Teeth Intact and Dental Advisory Given   Pulmonary asthma ,  breath sounds clear to auscultation- rhonchi  Pulmonary exam normal       Cardiovascular Exercise Tolerance: Good hypertension, Pt. on medications - CAD, - Past MI and - CHF Rhythm:Regular Rate:Normal     Neuro/Psych Depression negative neurological ROS     GI/Hepatic negative GI ROS, Neg liver ROS,   Endo/Other  negative endocrine ROSneg diabetes  Renal/GU Renal disease     Musculoskeletal negative musculoskeletal ROS (+)   Abdominal   Peds  Hematology negative hematology ROS (+)   Anesthesia Other Findings   Reproductive/Obstetrics                          Anesthesia Physical Anesthesia Plan  ASA: II  Anesthesia Plan: General   Post-op Pain Management:    Induction: Intravenous  Airway Management Planned: LMA  Additional Equipment:   Intra-op Plan:   Post-operative Plan: Extubation in OR  Informed Consent: I have reviewed the patients History and Physical, chart, labs and discussed the procedure including the risks, benefits and alternatives for the proposed anesthesia with the patient or authorized representative who has indicated his/her understanding and acceptance.   Dental advisory given  Plan Discussed with: CRNA  Anesthesia Plan Comments:        Anesthesia Quick Evaluation

## 2012-06-04 NOTE — Anesthesia Postprocedure Evaluation (Signed)
Anesthesia Post Note  Patient: Robin Wilson  Procedure(s) Performed: Procedure(s) (LRB): CYSTOSCOPY/RETROGRADE/URETEROSCOPY (Left) HOLMIUM LASER APPLICATION (Left) CYSTOSCOPY WITH STENT PLACEMENT (Left)  Anesthesia type: General  Patient location: PACU  Post pain: Pain level controlled  Post assessment: Post-op Vital signs reviewed  Last Vitals: BP 136/84  Pulse 93  Temp 36.7 C (Oral)  Resp 18  Ht 5\' 10"  (1.778 m)  Wt 132 lb 6 oz (60.045 kg)  BMI 18.99 kg/m2  SpO2 96%  Post vital signs: Reviewed  Level of consciousness: sedated  Complications: No apparent anesthesia complications

## 2012-06-04 NOTE — Progress Notes (Signed)
PACU Nursing Note: Pt alert and oriented x 4, mae x 4, tolerating PO liquids well, pt assisted to bathroom, having urgency, gait noted to be very steady, required min assistance, vital signs stable, voices no complaints

## 2012-06-04 NOTE — H&P (Signed)
cc: Dr. Clyde Canterbury   Reason For Visit  2 week f/u & KUB   Active Problems Problems  1. Mid Ureteral Stone On The Left 592.1 2. Nephrolithiasis 592.0  History of Present Illness       51 y/o white female returns today for a 2 week f/u & KUB for hx of Lt ureteral stone.  She is scheduled for Lt ESWL on 06/04/12.    She was last seen by Marcello Fennel, PA on 05/15/12 for acute evaluation of left flank pain.  She reports sudden onset of left flank pain with associated N/V 05/04/12.  She had some Toradol tablets and Percocet on hand which she took with moderate relief.  The left flank pain has remained intermittent since that time.  She had another attack of severe left flank pain with associated N/V last night which prompted today's visit.  She denies gross hematuria, dysuria, fever or chills.  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  She has not passed a stone to her knowledge.  The pain does not radiate into the LEs or bottocks.  The pain is unchanged with position or activities.  She has not had recent low back injury/trauma.    She was recently treated for a left distal ureteral stone 07/2011 documented on KUB.  She never actually saw the stone pass but had not had further flank pain.  She was without complaints at her f/u visit and urinalysis was clear and KUB failed to demonstrate any obvious persistent calculi over the course of the distal ureter so it was assumed that her stone had passed.  Her last previous stone passed in 2010.  She has calcium oxalate stones.   Past Medical History Problems  1. History of  Distal Ureteral Stone On The Left 592.1 2. History of  Nephrolithiasis V13.01 3. History of  Osteopenia 733.90  Surgical History Problems  1. History of  Back Surgery 2. History of  Cystoscopy With Ureteroscopy With Manipulation Of Calculus 3. History of  Lithotripsy  Current Meds 1. Beelith 362-20 MG Oral Tablet; Take 1 tablet  daily; Therapy: 04Apr2013 to (Evaluate:30Mar2014)   Requested for: 04Apr2013; Last Rx:04Apr2013 2. Calcium + D TABS; Therapy: (Recorded:28Mar2008) to 3. Folic Acid 1 MG Oral Tablet; Therapy: 16Sep2013 to 4. HydrOXYzine HCl 25 MG Oral Tablet; Therapy: 24Sep2012 to 5. Losartan Potassium-HCTZ 100-25 MG Oral Tablet; Therapy: 05Sep2013 to 6. Multi-Vitamin TABS; Therapy: (Recorded:28Mar2008) to 7. Ondansetron 8 MG Oral Tablet Dispersible; TAKE 1 TABLET Every 6 hours PRN nausea; Therapy:  17Sep2013 to (Last Rx:17Sep2013)  Requested for: 17Sep2013 8. Oxycodone-Acetaminophen 5-325 MG Oral Tablet; take 1 or 2 tablets q 4-6 hours prn pain;  Therapy: 17Sep2013 to (Last Rx:17Sep2013) 9. Rapaflo 8 MG Oral Capsule; TAKE 1 CAPSULE Daily to facilitate stone passage; Therapy:  17Sep2013 to (Evaluate:17Oct2013); Last Rx:17Sep2013 10. Sertraline HCl 100 MG Oral Tablet; Therapy: 26Sep2012 to 11. Urocit-K 15 15 MEQ (1620 MG) Oral Tablet Extended Release; Take 1 tablet twice daily; Therapy:   04Jan2013 to (Evaluate:03Jul2013)  Requested for: 04Jan2013; Last Rx:04Jan2013  Allergies Medication  1. Sulfa Drugs  Family History Problems  1. Maternal history of  Arteriosclerotic Cardiovascular Disease (ASCVD) 2. Family history of  Family Health Status Number Of Children 2 boys 3. Paternal history of  Stroke Syndrome V17.1  Social History Problems  1. Caffeine Use 2. Marital History - Currently Married 3. Marital History - Widowed 4. Never A Smoker 5. Occupation:  electrial enger.  Review of Systems Genitourinary, constitutional, skin, eye, otolaryngeal, hematologic/lymphatic, cardiovascular, pulmonary, endocrine, musculoskeletal, gastrointestinal, neurological and psychiatric system(s) were reviewed and pertinent findings if present are noted.  Genitourinary: hematuria.  Gastrointestinal: nausea, vomiting and flank pain.    Vitals Vital Signs [Data Includes: Last 1 Day]  01Oct2013 09:18AM  Blood  Pressure: 131 / 78 Temperature: 97.7 F Heart Rate: 73  Physical Exam Abdomen: The abdomen is flat. The abdomen is soft and nontender. No masses are palpated. No CVA tenderness. No hernias are palpable. No hepatosplenomegaly noted.    Results/Data Urine [Data Includes: Last 1 Day]   01Oct2013  COLOR YELLOW   APPEARANCE CLEAR   SPECIFIC GRAVITY 1.020   pH 6.0   GLUCOSE NEG mg/dL  BILIRUBIN NEG   KETONE NEG mg/dL  BLOOD MOD   PROTEIN NEG mg/dL  UROBILINOGEN 0.2 mg/dL  NITRITE NEG   LEUKOCYTE ESTERASE NEG   SQUAMOUS EPITHELIAL/HPF RARE   WBC 0-2 WBC/hpf  RBC 7-10 RBC/hpf  BACTERIA RARE   CRYSTALS NONE SEEN   CASTS NONE SEEN    Procedure  KUB: Bilateral renal stones, No L ureteral stone identified.     Assessment Assessed  1. Nephrolithiasis 592.0 2. Mid Ureteral Stone On The Left 592.1 3. Microscopic Hematuria 599.72   Pt is scheduled for lithotripsy Monday evening, however, no L stone is seen . Her CT shows 7mm L mid-ureteral stone, and she will be better off with ureteroscopy and laser fragmentation of stone and JJ.   Plan Health Maintenance (V70.0)  1. UA With REFLEX  Done: 01Oct2013 08:50AM   Cancel lithotripsy and schedule cysto and ureteroscopy and laser of stone. She will need litholink during post op course.   Signatures Electronically signed by : Jethro Bolus, M.D.; May 29 2012 11:08AM

## 2012-06-04 NOTE — Interval H&P Note (Signed)
History and Physical Interval Note:  06/04/2012 5:53 PM  Robin Wilson  has presented today for surgery, with the diagnosis of Left Mid Ureteral Stone  The various methods of treatment have been discussed with the patient and family. After consideration of risks, benefits and other options for treatment, the patient has consented to  Procedure(s) (LRB) with comments: CYSTOSCOPY/RETROGRADE/URETEROSCOPY (Left) HOLMIUM LASER APPLICATION (Left) CYSTOSCOPY WITH STENT PLACEMENT (Left) as a surgical intervention .  The patient's history has been reviewed, patient examined, no change in status, stable for surgery.  I have reviewed the patient's chart and labs.  Questions were answered to the patient's satisfaction.     Jethro Bolus I

## 2012-06-04 NOTE — Transfer of Care (Signed)
Immediate Anesthesia Transfer of Care Note  Patient: Robin Wilson  Procedure(s) Performed: Procedure(s) (LRB) with comments: CYSTOSCOPY/RETROGRADE/URETEROSCOPY (Left) HOLMIUM LASER APPLICATION (Left) CYSTOSCOPY WITH STENT PLACEMENT (Left)  Patient Location: PACU  Anesthesia Type: General  Level of Consciousness: sedated  Airway & Oxygen Therapy: Patient Spontanous Breathing and Patient connected to face mask oxygen  Post-op Assessment: Report given to PACU RN and Post -op Vital signs reviewed and stable  Post vital signs: Reviewed and stable  Complications: No apparent anesthesia complications

## 2012-06-04 NOTE — Op Note (Signed)
Pre-operative diagnosis : Distal left ureteral calculus, nonprogression  Postoperative diagnosis: Distal left ureteral calculus x2, nonprogression  Operation: Cystourethroscopy, left retrograde PolyGram interpretation, left ureteroscopy, basket extraction of left distal ureteral calculus, laser fractionation of left ureteral stone, with backstop placement. Left double-J stent (6 [place] by 24 cm).  Surgeon:  Kathie Rhodes. Patsi Sears, MD  First assistant: None  [id number]}  Preparation: After appropriate preanesthesia, the patient was brought to the operating room, placed on the operating table in the dorsal supine position where general LMA anesthesia was introduced. Armband was double checked. The left arm was marked. She was replaced in dorsal lithotomy position with pubis was prepped with Betadine solution and draped in usual fashion.  Review history:Active Problems  Problems  1. Mid Ureteral Stone On The Left 592.1  2. Nephrolithiasis 592.0  History of Present Illness  51 y/o white female returns today for a 2 week f/u & KUB for hx of Lt ureteral stone. She is scheduled for Lt ESWL on 06/04/12.  She was last seen by Marcello Fennel, PA on 05/15/12 for acute evaluation of left flank pain. She reports sudden onset of left flank pain with associated N/V 05/04/12. She had some Toradol tablets and Percocet on hand which she took with moderate relief. The left flank pain has remained intermittent since that time. She had another attack of severe left flank pain with associated N/V last night which prompted today's visit. She denies gross hematuria, dysuria, fever or chills. 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 She has not passed a stone to her knowledge. The pain does not radiate into the LEs or bottocks. The pain is unchanged with position or activities. She has not had recent low back injury/trauma.  She was recently treated for a left distal  ureteral stone 07/2011 documented on KUB. She never actually saw the stone pass but had not had further flank pain. She was without complaints at her f/u visit and urinalysis was clear and KUB failed to demonstrate any obvious persistent calculi over the course of the distal ureter so it was assumed that her stone had passed. Her last previous stone passed in 2010. She has calcium oxalate stones.      Statement of  Likelihood of Success: Excellent. TIME-OUT observed.:  Procedure: Cystourethroscopy was accomplished, which shows normal-appearing bladder base, with normal trigone. Clear urine was seen from the right ureteral orifice. The left ureteral orifice was in normal position on the trigone. Retrograde pyelogram showed no ureteral dilation, and no definite stone could be identified, even under magnification. Ureteroscopy was accomplished with the 6 French short ureteroscope, into lower ureteral calculi were identified, one on top of the other in the distal ureter. Using a 4 wire basket, the distal stone was basket extracted. The basket was placed around the more proximal stone, but the stone could not be manipulated through the intramural tunnel. Therefore, a clamp was placed around the basket, and the basket was cut. The 6 French ureteroscope was replaced, and backstop gel was placed above the stone. The 200  fiber laser was then placed through the ureteroscope, and the stone fragmented. Pieces were then basket extracted. The previously cut basket was removed. All of the tines were catheter for and removed. The previously placed guidewire into the renal pelvis was left in place, and over this, a 6 [place] by 24 cm double-J stent was placed without difficulty, and under fluoroscopic control. The patient received IV  Tylenol, IV Toradol. She was awakened, taken to recovery room in good condition.

## 2012-06-05 ENCOUNTER — Encounter (HOSPITAL_COMMUNITY): Payer: Self-pay | Admitting: Urology

## 2012-10-02 DIAGNOSIS — H524 Presbyopia: Secondary | ICD-10-CM | POA: Insufficient documentation

## 2012-10-02 DIAGNOSIS — H5210 Myopia, unspecified eye: Secondary | ICD-10-CM | POA: Insufficient documentation

## 2012-12-19 DIAGNOSIS — Z79899 Other long term (current) drug therapy: Secondary | ICD-10-CM | POA: Insufficient documentation

## 2013-01-03 DIAGNOSIS — M7710 Lateral epicondylitis, unspecified elbow: Secondary | ICD-10-CM | POA: Insufficient documentation

## 2013-05-24 ENCOUNTER — Emergency Department (HOSPITAL_COMMUNITY): Payer: BC Managed Care – PPO

## 2013-05-24 ENCOUNTER — Emergency Department (HOSPITAL_COMMUNITY)
Admission: EM | Admit: 2013-05-24 | Discharge: 2013-05-24 | Disposition: A | Discharge status: Home / Self Care | Payer: BC Managed Care – PPO | Attending: Emergency Medicine | Admitting: Emergency Medicine

## 2013-05-24 ENCOUNTER — Encounter (HOSPITAL_COMMUNITY): Payer: Self-pay | Admitting: Adult Health

## 2013-05-24 DIAGNOSIS — I10 Essential (primary) hypertension: Secondary | ICD-10-CM | POA: Insufficient documentation

## 2013-05-24 DIAGNOSIS — Z79899 Other long term (current) drug therapy: Secondary | ICD-10-CM | POA: Insufficient documentation

## 2013-05-24 DIAGNOSIS — R5381 Other malaise: Secondary | ICD-10-CM | POA: Insufficient documentation

## 2013-05-24 DIAGNOSIS — R110 Nausea: Secondary | ICD-10-CM | POA: Insufficient documentation

## 2013-05-24 DIAGNOSIS — N390 Urinary tract infection, site not specified: Secondary | ICD-10-CM

## 2013-05-24 DIAGNOSIS — Z87442 Personal history of urinary calculi: Secondary | ICD-10-CM | POA: Insufficient documentation

## 2013-05-24 DIAGNOSIS — R0789 Other chest pain: Secondary | ICD-10-CM

## 2013-05-24 DIAGNOSIS — R002 Palpitations: Secondary | ICD-10-CM | POA: Insufficient documentation

## 2013-05-24 DIAGNOSIS — R5383 Other fatigue: Secondary | ICD-10-CM | POA: Insufficient documentation

## 2013-05-24 DIAGNOSIS — R42 Dizziness and giddiness: Secondary | ICD-10-CM | POA: Insufficient documentation

## 2013-05-24 DIAGNOSIS — R0602 Shortness of breath: Secondary | ICD-10-CM | POA: Insufficient documentation

## 2013-05-24 LAB — URINALYSIS, ROUTINE W REFLEX MICROSCOPIC
Bilirubin Urine: NEGATIVE
Glucose, UA: NEGATIVE mg/dL
Ketones, ur: NEGATIVE mg/dL
Nitrite: POSITIVE — AB
Protein, ur: 30 mg/dL — AB
Specific Gravity, Urine: 1.027 (ref 1.005–1.030)
Urobilinogen, UA: 1 mg/dL (ref 0.0–1.0)
pH: 6 (ref 5.0–8.0)

## 2013-05-24 LAB — BASIC METABOLIC PANEL
BUN: 18 mg/dL (ref 6–23)
CO2: 29 mEq/L (ref 19–32)
Calcium: 9.5 mg/dL (ref 8.4–10.5)
Chloride: 103 mEq/L (ref 96–112)
Creatinine, Ser: 0.54 mg/dL (ref 0.50–1.10)
GFR calc Af Amer: >90 mL/min (ref >90)
GFR calc non Af Amer: >90 mL/min (ref >90)
Glucose, Bld: 96 mg/dL (ref 70–99)
Potassium: 3.2 mEq/L — ABNORMAL LOW (ref 3.5–5.1)
Sodium: 142 mEq/L (ref 135–145)

## 2013-05-24 LAB — URINE MICROSCOPIC-ADD ON

## 2013-05-24 LAB — TYPE AND SCREEN
ABO/RH(D): O NEG
Antibody Screen: NEGATIVE

## 2013-05-24 LAB — CBC
HCT: 30 % — ABNORMAL LOW (ref 36.0–46.0)
Hemoglobin: 7.1 g/dL — ABNORMAL LOW (ref 12.0–15.0)
MCH: 23.6 pg — ABNORMAL LOW (ref 26.0–34.0)
MCHC: 23.7 g/dL — ABNORMAL LOW (ref 30.0–36.0)
MCV: 99.7 fL (ref 78.0–100.0)
Platelets: 236 10*3/uL (ref 150–400)
RBC: 3.01 MIL/uL — ABNORMAL LOW (ref 3.87–5.11)
RDW: 13.7 % (ref 11.5–15.5)
WBC: 2.9 10*3/uL — ABNORMAL LOW (ref 4.0–10.5)

## 2013-05-24 LAB — CBC WITH DIFFERENTIAL/PLATELET
Basophils Absolute: 0 10*3/uL (ref 0.0–0.1)
Basophils Relative: 1 % (ref 0–1)
Eosinophils Absolute: 0.5 10*3/uL (ref 0.0–0.7)
Eosinophils Relative: 11 % — ABNORMAL HIGH (ref 0–5)
HCT: 30.9 % — ABNORMAL LOW (ref 36.0–46.0)
Hemoglobin: 11.1 g/dL — ABNORMAL LOW (ref 12.0–15.0)
Lymphocytes Relative: 48 % — ABNORMAL HIGH (ref 12–46)
Lymphs Abs: 2.1 10*3/uL (ref 0.7–4.0)
MCH: 35.6 pg — ABNORMAL HIGH (ref 26.0–34.0)
MCHC: 35.9 g/dL (ref 30.0–36.0)
MCV: 99 fL (ref 78.0–100.0)
Monocytes Absolute: 0.2 10*3/uL (ref 0.1–1.0)
Monocytes Relative: 5 % (ref 3–12)
Neutro Abs: 1.5 10*3/uL — ABNORMAL LOW (ref 1.7–7.7)
Neutrophils Relative %: 35 % — ABNORMAL LOW (ref 43–77)
Platelets: 255 10*3/uL (ref 150–400)
RBC: 3.12 MIL/uL — ABNORMAL LOW (ref 3.87–5.11)
RDW: 13.7 % (ref 11.5–15.5)
WBC: 4.4 10*3/uL (ref 4.0–10.5)

## 2013-05-24 LAB — OCCULT BLOOD, POC DEVICE: Fecal Occult Bld: NEGATIVE

## 2013-05-24 LAB — ABO/RH: ABO/RH(D): O NEG

## 2013-05-24 LAB — POCT I-STAT TROPONIN I
Troponin i, poc: 0 ng/mL (ref 0.00–0.08)
Troponin i, poc: 0 ng/mL (ref 0.00–0.08)

## 2013-05-24 MED ORDER — POTASSIUM CHLORIDE CRYS ER 20 MEQ PO TBCR
40.0000 meq | EXTENDED_RELEASE_TABLET | Freq: Once | ORAL | Status: AC
  Administered 2013-05-24: 40 meq via ORAL
  Filled 2013-05-24: qty 2

## 2013-05-24 MED ORDER — HYDROXYZINE HCL 10 MG PO TABS: 10.0000 mg | ORAL_TABLET | ORAL | Status: AC | PRN

## 2013-05-24 MED ORDER — ASPIRIN 325 MG PO TABS
325.0000 mg | ORAL_TABLET | ORAL | Status: AC
  Administered 2013-05-24: 325 mg via ORAL
  Filled 2013-05-24: qty 1

## 2013-05-24 MED ORDER — LORAZEPAM 2 MG/ML IJ SOLN
1.0000 mg | Freq: Once | INTRAMUSCULAR | Status: AC
  Administered 2013-05-24: 1 mg via INTRAVENOUS
  Filled 2013-05-24: qty 1

## 2013-05-24 MED ORDER — HYDROCODONE-ACETAMINOPHEN 5-325 MG PO TABS: ORAL_TABLET | ORAL | Status: DC

## 2013-05-24 MED ORDER — KETOROLAC TROMETHAMINE 30 MG/ML IJ SOLN
30.0000 mg | Freq: Once | INTRAMUSCULAR | Status: AC
  Administered 2013-05-24: 30 mg via INTRAVENOUS
  Filled 2013-05-24: qty 1

## 2013-05-24 MED ORDER — PROMETHAZINE HCL 25 MG PO TABS: 25.0000 mg | ORAL_TABLET | Freq: Four times a day (QID) | ORAL | Status: DC | PRN

## 2013-05-24 MED ORDER — MORPHINE SULFATE 4 MG/ML IJ SOLN
4.0000 mg | Freq: Once | INTRAMUSCULAR | Status: AC
  Administered 2013-05-24: 4 mg via INTRAVENOUS
  Filled 2013-05-24: qty 1

## 2013-05-24 MED ORDER — CEPHALEXIN 500 MG PO CAPS: 500.0000 mg | ORAL_CAPSULE | Freq: Two times a day (BID) | ORAL | Status: DC

## 2013-05-24 MED ORDER — CEFTRIAXONE SODIUM 1 G IJ SOLR
1.0000 g | Freq: Once | INTRAMUSCULAR | Status: AC
  Administered 2013-05-24: 1 g via INTRAVENOUS
  Filled 2013-05-24: qty 10

## 2013-05-24 NOTE — ED Provider Notes (Signed)
CSN: [id number]     Arrival date & time 05/24/13  1658 History   First MD Initiated Contact with Patient 05/24/13 1830     Chief Complaint  Patient presents with  . Chest Pain   (Consider location/radiation/quality/duration/timing/severity/associated sxs/prior Treatment) HPI  Robin Wilson is a 52 y.o. female complaining of pressure-like chest pain onset this a.m. rated at 8/10 associated with shortness of breath, weakness, lightheaded sensation, palpitations and nausea. The pressure woke her from sleep. Patient denies fever, cough, history of DVT or PE, exogenous estrogen, recent travel or immobilization, calf swelling or leg pain. Patient denies hematochezia but endorses a darker than normal stool. She is unsure when this started. She is unsure if it is blackish or tarry. Patient sent from her primary care physician earlier in the day. States that she has never been a smoker, has past medical history significant for hypertension. Patient had a ablation and does not menstruate. No family history of colorectal cancer however her father had unexplained bouts of anemia. Patient started methotrexate for eczema 4 months ago.  Past Medical History  Diagnosis Date  . Kidney stones   . Hypertension   . Kidney stones    Past Surgical History  Procedure Laterality Date  . Uterine fibroid embolization    . Cesarean section    . Cystoscopy/retrograde/ureteroscopy  06/04/2012    Procedure: CYSTOSCOPY/RETROGRADE/URETEROSCOPY;  Surgeon: Kathi Ludwig, MD;  Location: [facility];  Service: Urology;  Laterality: Left;   History reviewed. No pertinent family history. History  Substance Use Topics  . Smoking status: Never Smoker   . Smokeless tobacco: Not on file  . Alcohol Use: No   OB History   Grav Para Term Preterm Abortions TAB SAB Ect Mult Living                 Review of Systems 10 systems reviewed and found to be negative, except as noted in the HPI   Allergies  Sulfa drugs cross  reactors  Home Medications   Current Outpatient Rx  Name  Route  Sig  Dispense  Refill  . calcium carbonate (OS-CAL - DOSED IN MG OF ELEMENTAL CALCIUM) 1250 MG tablet   Oral   Take 1 tablet by mouth daily.           . cetirizine (ZYRTEC) 10 MG tablet   Oral   Take 10 mg by mouth daily.          Marland Kitchen losartan-hydrochlorothiazide (HYZAAR) 100-25 MG per tablet   Oral   Take 1 tablet by mouth daily.          . methotrexate 2.5 MG tablet   Oral   Take 15 mg by mouth once a week. sunday         . sertraline (ZOLOFT) 100 MG tablet   Oral   Take 100 mg by mouth daily. In evening         . hydrocodone-acetaminophen (LORCET PLUS) 7.5-650 MG per tablet   Oral   Take 1 tablet by mouth every 6 (six) hours as needed for pain.   30 tablet   0   . Multiple Vitamins-Minerals (MULTIVITAMIN PO)   Oral   Take 1 tablet by mouth daily.           BP 135/73  Pulse 78  Temp(Src) 98.4 F (36.9 C) (Oral)  Resp 18  Ht 5\' 10"  (1.778 m)  Wt 141 lb 8 oz (64.184 kg)  BMI 20.3 kg/m2  SpO2 98%  Physical Exam  Nursing note and vitals reviewed. Constitutional: She is oriented to person, place, and time. She appears well-developed and well-nourished. No distress.  HENT:  Head: Normocephalic.  Mouth/Throat: Oropharynx is clear and moist.  Mild conjunctival pallor  Eyes: Conjunctivae and EOM are normal. Pupils are equal, round, and reactive to light.  Cardiovascular: Normal rate, regular rhythm and intact distal pulses.   Pulmonary/Chest: Effort normal and breath sounds normal. No stridor. No respiratory distress. She has no wheezes. She has no rales.  Abdominal: Soft. Bowel sounds are normal. She exhibits no distension and no mass. There is no tenderness. There is no rebound and no guarding.  Musculoskeletal: Normal range of motion.  Neurological: She is alert and oriented to person, place, and time.  Psychiatric: She has a normal mood and affect.    ED Course  Procedures (including  critical care time) Labs Review Labs Reviewed  CBC - Abnormal; Notable for the following:    WBC 2.9 (*)    RBC 3.01 (*)    Hemoglobin 7.1 (*)    HCT 30.0 (*)    MCH 23.6 (*)    MCHC 23.7 (*)    All other components within normal limits  BASIC METABOLIC PANEL - Abnormal; Notable for the following:    Potassium 3.2 (*)    All other components within normal limits  URINALYSIS, ROUTINE W REFLEX MICROSCOPIC - Abnormal; Notable for the following:    APPearance CLOUDY (*)    Hgb urine dipstick SMALL (*)    Protein, ur 30 (*)    Nitrite POSITIVE (*)    Leukocytes, UA LARGE (*)    All other components within normal limits  CBC WITH DIFFERENTIAL - Abnormal; Notable for the following:    RBC 3.12 (*)    Hemoglobin 11.1 (*)    HCT 30.9 (*)    MCH 35.6 (*)    Neutrophils Relative % 35 (*)    Neutro Abs 1.5 (*)    Lymphocytes Relative 48 (*)    Eosinophils Relative 11 (*)    All other components within normal limits  URINE MICROSCOPIC-ADD ON - Abnormal; Notable for the following:    Squamous Epithelial / LPF FEW (*)    Bacteria, UA MANY (*)    All other components within normal limits  URINE CULTURE  POCT I-STAT TROPONIN I  OCCULT BLOOD, POC DEVICE  POCT I-STAT TROPONIN I  TYPE AND SCREEN  ABO/RH   Imaging Review Dg Chest 2 View  05/24/2013   CLINICAL DATA:  Chest pressure, history hypertension  EXAM: CHEST  2 VIEW  COMPARISON:  06/04/2012  FINDINGS: Normal heart size, mediastinal contours, and pulmonary vascularity.  Lungs clear.  No pulmonary infiltrate, pleural effusion, or pneumothorax.  Minimal right apical scarring stable.  Bones unremarkable.  IMPRESSION: No acute abnormalities.   Electronically Signed   By: Ulyses Southward M.D.   On: 05/24/2013 17:39    Date: 05/24/2013  Rate: 78  Rhythm: normal sinus rhythm  QRS Axis: normal  Intervals: normal  ST/T Wave abnormalities: nonspecific ST/T changes  Conduction Disutrbances:none  Narrative Interpretation:   Old EKG Reviewed:  unchanged   MDM   1. UTI (lower urinary tract infection)   2. Atypical chest pain     Filed Vitals:   05/24/13 2026 05/24/13 2030 05/24/13 2045 05/24/13 2100  BP: 126/76 124/86 114/67 125/84  Pulse: 74 68 80 76  Temp:      TempSrc:      Resp: 22 12  20 20  Height:      Weight:      SpO2: 96% 100% 97% 97%     Robin Wilson is a 52 y.o. female  with past medical history significant for hypertension complaining of pressure-like chest pain onset this a.m. while she was at rest associated with shortness of breath, weakness, lightheaded sensation palpitations and nausea. Patient is found to be significantly anemic with an H&H of 70/30, EKG is nonischemic and unchanged from prior. Stool guaiac is negative. She does not menstruate. We are unclear where this blood loss is coming from she does not appear to be chronically ill or debilitative. We are repeating the CBC before transfusion.  Repeat CBC shows a normal H&H of 11/30. The lab double checked this to verify. This also correlates to the appropriate ratio with a hematocrit of 30. Patient is low risk by Wells criteria and physical exam and history of present illness are not consistent with DVT/PE. I doubt that is the culprit. Patient also states that she is extremely stressed with a lot of personal issues at this time. I think it may be secondary to anxiety.  Urinalysis shows infection she will be given a gram of Rocephin through the IV and I will send her home on Keflex.  Medications  aspirin tablet 325 mg (325 mg Oral Given 05/24/13 1711)  LORazepam (ATIVAN) injection 1 mg (1 mg Intravenous Given 05/24/13 1947)  cefTRIAXone (ROCEPHIN) 1 g in dextrose 5 % 50 mL IVPB (0 g Intravenous Stopped 05/24/13 2204)  potassium chloride SA (K-DUR,KLOR-CON) CR tablet 40 mEq (40 mEq Oral Given 05/24/13 2240)  ketorolac (TORADOL) 30 MG/ML injection 30 mg (30 mg Intravenous Given 05/24/13 2311)  morphine 4 MG/ML injection 4 mg (4 mg Intravenous Given 05/24/13  2311)    Pt is hemodynamically stable, appropriate for, and amenable to discharge at this time. Pt verbalized understanding and agrees with care plan. All questions answered. Outpatient follow-up and specific return precautions discussed.    Discharge Medication List as of 05/24/2013 10:25 PM    START taking these medications   Details  cephALEXin (KEFLEX) 500 MG capsule Take 1 capsule (500 mg total) by mouth 2 (two) times daily., Starting 05/24/2013, Until Discontinued, Print    hydrOXYzine (ATARAX/VISTARIL) 10 MG tablet Take 1 tablet (10 mg total) by mouth every 4 (four) hours as needed for anxiety., Starting 05/24/2013, Until Discontinued, Print    promethazine (PHENERGAN) 25 MG tablet Take 1 tablet (25 mg total) by mouth every 6 (six) hours as needed for nausea., Starting 05/24/2013, Until Discontinued, Print        Note: Portions of this report may have been transcribed using voice recognition software. Every effort was made to ensure accuracy; however, inadvertent computerized transcription errors may be present      Wynetta Emery, PA-C 05/26/13 0020

## 2013-05-24 NOTE — ED Notes (Signed)
Presents with chest pressure that began at 5 am, pressure is constant associated with nausea, SOB, dizziness and weakness. Pain radiates into left neck and shoulder. Nothing makes pain better. Nothing makes pain worse. Sent from [employer] office. Alert and oriented. Also concerned about a UTI, reports frequent urination.

## 2013-05-26 LAB — URINE CULTURE: Colony Count: >=100000

## 2013-05-26 NOTE — ED Provider Notes (Signed)
Medical screening examination/treatment/procedure(s) were performed by non-physician practitioner and as supervising physician I was immediately available for consultation/collaboration.   Gwyneth Sprout, MD 05/26/13 [id number]

## 2013-05-27 NOTE — ED Notes (Signed)
+   Urine Patient treated with Cephalexin-sensitive to same-chart appended per protocol MD. 

## 2014-06-24 ENCOUNTER — Other Ambulatory Visit: Payer: Self-pay | Admitting: Gastroenterology

## 2014-12-17 DIAGNOSIS — H52223 Regular astigmatism, bilateral: Secondary | ICD-10-CM | POA: Insufficient documentation

## 2014-12-17 DIAGNOSIS — H5213 Myopia, bilateral: Secondary | ICD-10-CM | POA: Insufficient documentation

## 2015-04-02 ENCOUNTER — Other Ambulatory Visit: Payer: Self-pay | Admitting: Obstetrics and Gynecology

## 2015-04-03 LAB — CYTOLOGY - PAP

## 2015-09-13 ENCOUNTER — Emergency Department (HOSPITAL_COMMUNITY)
Admission: EM | Admit: 2015-09-13 | Discharge: 2015-09-13 | Disposition: A | Discharge status: Home / Self Care | Payer: BLUE CROSS/BLUE SHIELD | Attending: Emergency Medicine | Admitting: Emergency Medicine

## 2015-09-13 ENCOUNTER — Emergency Department (HOSPITAL_COMMUNITY): Payer: BLUE CROSS/BLUE SHIELD

## 2015-09-13 ENCOUNTER — Encounter (HOSPITAL_COMMUNITY): Payer: Self-pay | Admitting: Emergency Medicine

## 2015-09-13 DIAGNOSIS — N200 Calculus of kidney: Secondary | ICD-10-CM

## 2015-09-13 DIAGNOSIS — I10 Essential (primary) hypertension: Secondary | ICD-10-CM | POA: Insufficient documentation

## 2015-09-13 DIAGNOSIS — R109 Unspecified abdominal pain: Secondary | ICD-10-CM | POA: Diagnosis present

## 2015-09-13 DIAGNOSIS — Z79899 Other long term (current) drug therapy: Secondary | ICD-10-CM | POA: Diagnosis not present

## 2015-09-13 LAB — COMPREHENSIVE METABOLIC PANEL
ALT: 23 U/L (ref 14–54)
AST: 31 U/L (ref 15–41)
Albumin: 4.8 g/dL (ref 3.5–5.0)
Alkaline Phosphatase: 82 U/L (ref 38–126)
Anion gap: 11 (ref 5–15)
BUN: 25 mg/dL — ABNORMAL HIGH (ref 6–20)
CO2: 32 mmol/L (ref 22–32)
Calcium: 9.9 mg/dL (ref 8.9–10.3)
Chloride: 101 mmol/L (ref 101–111)
Creatinine, Ser: 0.7 mg/dL (ref 0.44–1.00)
GFR calc Af Amer: >60 mL/min (ref >60)
GFR calc non Af Amer: >60 mL/min (ref >60)
Glucose, Bld: 127 mg/dL — ABNORMAL HIGH (ref 65–99)
Potassium: 3.6 mmol/L (ref 3.5–5.1)
Sodium: 144 mmol/L (ref 135–145)
Total Bilirubin: 0.7 mg/dL (ref 0.3–1.2)
Total Protein: 7.2 g/dL (ref 6.5–8.1)

## 2015-09-13 LAB — URINALYSIS, ROUTINE W REFLEX MICROSCOPIC
Bilirubin Urine: NEGATIVE
Glucose, UA: NEGATIVE mg/dL
Ketones, ur: NEGATIVE mg/dL
Nitrite: NEGATIVE
Protein, ur: 100 mg/dL — AB
Specific Gravity, Urine: 1.025 (ref 1.005–1.030)
pH: 7.5 (ref 5.0–8.0)

## 2015-09-13 LAB — CBC
HCT: 39.2 % (ref 36.0–46.0)
Hemoglobin: 13.5 g/dL (ref 12.0–15.0)
MCH: 34.6 pg — ABNORMAL HIGH (ref 26.0–34.0)
MCHC: 34.4 g/dL (ref 30.0–36.0)
MCV: 100.5 fL — ABNORMAL HIGH (ref 78.0–100.0)
Platelets: 310 10*3/uL (ref 150–400)
RBC: 3.9 MIL/uL (ref 3.87–5.11)
RDW: 12.4 % (ref 11.5–15.5)
WBC: 7.9 10*3/uL (ref 4.0–10.5)

## 2015-09-13 LAB — URINE MICROSCOPIC-ADD ON: WBC, UA: NONE SEEN WBC/hpf (ref 0–5)

## 2015-09-13 LAB — LIPASE, BLOOD: Lipase: 27 U/L (ref 11–51)

## 2015-09-13 MED ORDER — FENTANYL CITRATE (PF) 100 MCG/2ML IJ SOLN
25.0000 ug | Freq: Once | INTRAMUSCULAR | Status: AC
  Administered 2015-09-13: 25 ug via INTRAVENOUS
  Filled 2015-09-13: qty 2

## 2015-09-13 MED ORDER — TAMSULOSIN HCL 0.4 MG PO CAPS: 0.4000 mg | ORAL_CAPSULE | Freq: Every day | ORAL | Status: DC

## 2015-09-13 MED ORDER — OXYCODONE-ACETAMINOPHEN 5-325 MG PO TABS: 1.0000 | ORAL_TABLET | ORAL | Status: DC | PRN

## 2015-09-13 MED ORDER — ONDANSETRON HCL 4 MG PO TABS: 4.0000 mg | ORAL_TABLET | Freq: Four times a day (QID) | ORAL | Status: DC

## 2015-09-13 MED ORDER — ONDANSETRON HCL 4 MG/2ML IJ SOLN
4.0000 mg | Freq: Once | INTRAMUSCULAR | Status: AC | PRN
  Administered 2015-09-13: 4 mg via INTRAVENOUS
  Filled 2015-09-13: qty 2

## 2015-09-13 MED ORDER — SODIUM CHLORIDE 0.9 % IV BOLUS (SEPSIS)
1000.0000 mL | Freq: Once | INTRAVENOUS | Status: AC
  Administered 2015-09-13: 1000 mL via INTRAVENOUS

## 2015-09-13 MED ORDER — KETOROLAC TROMETHAMINE 30 MG/ML IJ SOLN
30.0000 mg | Freq: Once | INTRAMUSCULAR | Status: AC
  Administered 2015-09-13: 30 mg via INTRAVENOUS
  Filled 2015-09-13: qty 1

## 2015-09-13 NOTE — Discharge Instructions (Signed)
Ms. BRYTTANI PFLUGH,  Nice meeting you! Please follow-up with your urologist. Return to the emergency department if you continue to have pain, are unable to urinate. Feel better soon!  S. Wendie Simmer, PA-C   Kidney Stones Kidney stones (urolithiasis) are deposits that form inside your kidneys. The intense pain is caused by the stone moving through the urinary tract. When the stone moves, the ureter goes into spasm around the stone. The stone is usually passed in the urine.  CAUSES   A disorder that makes certain neck glands produce too much parathyroid hormone (primary hyperparathyroidism).  A buildup of uric acid crystals, similar to gout in your joints.  Narrowing (stricture) of the ureter.  A kidney obstruction present at birth (congenital obstruction).  Previous surgery on the kidney or ureters.  Numerous kidney infections. SYMPTOMS   Feeling sick to your stomach (nauseous).  Throwing up (vomiting).  Blood in the urine (hematuria).  Pain that usually spreads (radiates) to the groin.  Frequency or urgency of urination. DIAGNOSIS   Taking a history and physical exam.  Blood or urine tests.  CT scan.  Occasionally, an examination of the inside of the urinary bladder (cystoscopy) is performed. TREATMENT   Observation.  Increasing your fluid intake.  Extracorporeal shock wave lithotripsy--This is a noninvasive procedure that uses shock waves to break up kidney stones.  Surgery may be needed if you have severe pain or persistent obstruction. There are various surgical procedures. Most of the procedures are performed with the use of small instruments. Only small incisions are needed to accommodate these instruments, so recovery time is minimized. The size, location, and chemical composition are all important variables that will determine the proper choice of action for you. Talk to your health care provider to better understand your situation so that you will minimize  the risk of injury to yourself and your kidney.  HOME CARE INSTRUCTIONS   Drink enough water and fluids to keep your urine clear or pale yellow. This will help you to pass the stone or stone fragments.  Strain all urine through the provided strainer. Keep all particulate matter and stones for your health care provider to see. The stone causing the pain may be as small as a grain of salt. It is very important to use the strainer each and every time you pass your urine. The collection of your stone will allow your health care provider to analyze it and verify that a stone has actually passed. The stone analysis will often identify what you can do to reduce the incidence of recurrences.  Only take over-the-counter or prescription medicines for pain, discomfort, or fever as directed by your health care provider.  Keep all follow-up visits as told by your health care provider. This is important.  Get follow-up X-rays if required. The absence of pain does not always mean that the stone has passed. It may have only stopped moving. If the urine remains completely obstructed, it can cause loss of kidney function or even complete destruction of the kidney. It is your responsibility to make sure X-rays and follow-ups are completed. Ultrasounds of the kidney can show blockages and the status of the kidney. Ultrasounds are not associated with any radiation and can be performed easily in a matter of minutes.  Make changes to your daily diet as told by your health care provider. You may be told to:  Limit the amount of salt that you eat.  Eat 5 or more servings of fruits and  vegetables each day.  Limit the amount of meat, poultry, fish, and eggs that you eat.  Collect a 24-hour urine sample as told by your health care provider.You may need to collect another urine sample every 6-12 months. SEEK MEDICAL CARE IF:  You experience pain that is progressive and unresponsive to any pain medicine you have been  prescribed. SEEK IMMEDIATE MEDICAL CARE IF:   Pain cannot be controlled with the prescribed medicine.  You have a fever or shaking chills.  The severity or intensity of pain increases over 18 hours and is not relieved by pain medicine.  You develop a new onset of abdominal pain.  You feel faint or pass out.  You are unable to urinate.   This information is not intended to replace advice given to you by your health care provider. Make sure you discuss any questions you have with your health care provider.   Document Released: 08/15/2005 Document Revised: 05/06/2015 Document Reviewed: 01/16/2013 Elsevier Interactive Patient Education [employer].

## 2015-09-13 NOTE — ED Notes (Signed)
Patient states she went to the doctor yesterday because she thought it was a pulled muscle but the pain got worse. Patient states that she think it is a kidney stone. Patient has been nauseas and vomiting. Patient is a patient of Dr. Hartley Barefoot.Robin Wilson

## 2015-09-13 NOTE — ED Provider Notes (Signed)
CSN: [id number]     Arrival date & time 09/13/15  0414 History   First MD Initiated Contact with Patient 09/13/15 0602     Chief Complaint  Patient presents with  . Flank Pain   HPI  Robin Wilson is a 55 y.o. F PMH significant for kidney stones, HTN presenting with a 1 week history of worsening right flank pain. She describes her pain as 10/10 pain scale, similar to previous kidney stones, intermittent, sharp in right flank and radiating anteriorly and inferiorly to her suprapubic region. She endorses dysuria, nausea, vomiting (NBNB). She denies fevers, chills, CP, SOB, recent injury, diarrhea, constipation.   Past Medical History  Diagnosis Date  . Kidney stones   . Hypertension   . Kidney stones    Past Surgical History  Procedure Laterality Date  . Uterine fibroid embolization    . Cesarean section    . Cystoscopy/retrograde/ureteroscopy  06/04/2012    Procedure: CYSTOSCOPY/RETROGRADE/URETEROSCOPY;  Surgeon: Ailene Rud, MD;  Location: [facility];  Service: Urology;  Laterality: Left;   History reviewed. No pertinent family history. Social History  Substance Use Topics  . Smoking status: Never Smoker   . Smokeless tobacco: None  . Alcohol Use: No   OB History    No data available     Review of Systems  Ten systems are reviewed and are negative for acute change except as noted in the HPI  Allergies  Sulfa drugs cross reactors  Home Medications   Prior to Admission medications   Medication Sig Start Date End Date Taking? Authorizing Provider  albuterol (PROVENTIL HFA;VENTOLIN HFA) 108 (90 Base) MCG/ACT inhaler Inhale 1-2 puffs into the lungs every 6 (six) hours as needed for wheezing or shortness of breath.   Yes Historical Provider, MD  calcium carbonate (OS-CAL - DOSED IN MG OF ELEMENTAL CALCIUM) 1250 MG tablet Take 1 tablet by mouth daily.     Yes Historical Provider, MD  cetirizine (ZYRTEC) 10 MG tablet Take 10 mg by mouth daily.    Yes Historical Provider, MD   cyclobenzaprine (FLEXERIL) 10 MG tablet Take 10 mg by mouth 3 (three) times daily as needed for muscle spasms.  09/11/15  Yes Historical Provider, MD  HYDROcodone-acetaminophen (NORCO/VICODIN) 5-325 MG per tablet Take 1-2 tablets by mouth every 6 hours as needed for pain. 05/24/13  Yes Nicole Pisciotta, PA-C  hydrOXYzine (ATARAX/VISTARIL) 10 MG tablet Take 1 tablet (10 mg total) by mouth every 4 (four) hours as needed for anxiety. 05/24/13  Yes Nicole Pisciotta, PA-C  losartan-hydrochlorothiazide (HYZAAR) 100-25 MG per tablet Take 1 tablet by mouth daily.    Yes Historical Provider, MD  Multiple Vitamins-Minerals (MULTIVITAMIN PO) Take 1 tablet by mouth daily.    Yes Historical Provider, MD  sertraline (ZOLOFT) 100 MG tablet Take 100 mg by mouth daily. In evening   Yes Historical Provider, MD  cephALEXin (KEFLEX) 500 MG capsule Take 1 capsule (500 mg total) by mouth 2 (two) times daily. Patient not taking: Reported on 09/13/2015 05/24/13   Elmyra Ricks Pisciotta, PA-C  hydrocodone-acetaminophen (LORCET PLUS) 7.5-650 MG per tablet Take 1 tablet by mouth every 6 (six) hours as needed for pain. Patient not taking: Reported on 09/13/2015 06/04/12   Carolan Clines, MD  promethazine (PHENERGAN) 25 MG tablet Take 1 tablet (25 mg total) by mouth every 6 (six) hours as needed for nausea. Patient not taking: Reported on 09/13/2015 05/24/13   Elmyra Ricks Pisciotta, PA-C   BP 147/86 mmHg  Pulse 78  Temp(Src) 97.5  F (36.4 C) (Oral)  Resp 16  Ht 5\' 9"  (1.753 m)  Wt 54.432 kg  BMI 17.71 kg/m2  SpO2 97% Physical Exam  Constitutional: She appears well-developed and well-nourished. No distress.  HENT:  Head: Normocephalic and atraumatic.  Mouth/Throat: Oropharynx is clear and moist. No oropharyngeal exudate.  Eyes: Conjunctivae are normal. Pupils are equal, round, and reactive to light. Right eye exhibits no discharge. Left eye exhibits no discharge. No scleral icterus.  Neck: No tracheal deviation present.   Cardiovascular: Normal rate, regular rhythm, normal heart sounds and intact distal pulses.  Exam reveals no gallop and no friction rub.   No murmur heard. Pulmonary/Chest: Effort normal and breath sounds normal. No respiratory distress. She has no wheezes. She has no rales. She exhibits no tenderness.  Abdominal: Soft. Bowel sounds are normal. She exhibits no distension and no mass. There is tenderness. There is no rebound and no guarding.  Right CVA and suprapubic tenderness  Musculoskeletal: She exhibits no edema.  Lymphadenopathy:    She has no cervical adenopathy.  Neurological: She is alert. Coordination normal.  Skin: Skin is warm and dry. No rash noted. She is not diaphoretic. No erythema.  Psychiatric: She has a normal mood and affect. Her behavior is normal.  Nursing note and vitals reviewed.   ED Course  Procedures  Labs Review Labs Reviewed  URINALYSIS, ROUTINE W REFLEX MICROSCOPIC (NOT AT [facility]) - Abnormal; Notable for the following:    Color, Urine AMBER (*)    APPearance CLOUDY (*)    Hgb urine dipstick LARGE (*)    Protein, ur 100 (*)    Leukocytes, UA SMALL (*)    All other components within normal limits  CBC - Abnormal; Notable for the following:    MCV 100.5 (*)    MCH 34.6 (*)    All other components within normal limits  COMPREHENSIVE METABOLIC PANEL - Abnormal; Notable for the following:    Glucose, Bld 127 (*)    BUN 25 (*)    All other components within normal limits  URINE MICROSCOPIC-ADD ON - Abnormal; Notable for the following:    Squamous Epithelial / LPF 0-5 (*)    Bacteria, UA FEW (*)    All other components within normal limits  LIPASE, BLOOD    Imaging Review Ct Abdomen Wo Contrast  09/13/2015  CLINICAL DATA:  Back pain 2 days.  History of kidney stones. EXAM: CT ABDOMEN WITHOUT CONTRAST TECHNIQUE: Multidetector CT imaging of the abdomen was performed following the standard protocol without IV contrast. COMPARISON:  07/29/2014 and 05/15/2012  FINDINGS: Lung bases are within normal. Abdominal images demonstrate a normal liver, spleen, pancreas, gallbladder, adrenal glands and stomach. Kidneys are normal in size and demonstrate bilateral nephrolithiasis with the largest stone over the right lower pole measuring 8 mm and largest left renal stone over the lower pole measuring 4 mm. There is mild right-sided hydronephrosis as there is a 6 mm stone over the proximal right ureter just distal to the UPJ. Left ureter is normal. 2.1 cm cyst over the mid pole of the left kidney unchanged. Mild calcified plaque over the abdominal aorta. Appendix is normal. Colon and small bowel are within normal. Mesentery is unremarkable. Pelvic images demonstrate a normal bladder, uterus, ovaries and rectum. No free fluid. Remaining bones and soft tissues are unremarkable. IMPRESSION: Bilateral nephrolithiasis. 6 mm stone over the proximal right ureter causing low-grade obstruction. Stable 2.1 cm left renal cyst. Electronically Signed   By: Marin Olp  M.D.   On: 09/13/2015 08:01   I have personally reviewed and evaluated these images and lab results as part of my medical decision-making.   MDM   Final diagnoses:  Nephrolithiasis   Patient uncomfortable appearing, VSS. Based on patient history and physical exam, most likely etiologies are kidney stone vs UTI. Less likely etiologies include appendicitis, Meckel's diverticulum, ruptured ectopic pregnancy, ovarian torsion, ovarian cyst, PID/TOA, psoas abscess.  UA with hematuria. CBC, CMP, lipase unremarkable. CT reveals bilateral nephrolithiasis with the largest stone over the right lower pole measuring 8 mm and largest left renal stone over the lower pole measuring 4 mm. There is mild right-sided hydronephrosis as there is a 6 mm stone over the proximal right ureter just distal to the UPJ. Left ureter is normal. 2.1 cm cyst over the mid pole of the left kidney unchanged.   Discussed case with Dr. Oleta Mouse who advises  discharged with strict urology follow-up. Patient may be safely discharged home with flomax, zofran, and percocet. Discussed reasons for return.  Patient in understanding and agreement with the plan.   [place] Lions, PA-C 09/13/15 [place] Liu, MD 09/14/15 1150

## 2015-09-13 NOTE — ED Notes (Signed)
Bed: WA19 Expected date:  Expected time:  Means of arrival:  Comments: 

## 2015-09-15 ENCOUNTER — Emergency Department (HOSPITAL_COMMUNITY)
Admission: EM | Admit: 2015-09-15 | Discharge: 2015-09-16 | Disposition: A | Discharge status: Home / Self Care | Payer: BLUE CROSS/BLUE SHIELD | Attending: Emergency Medicine | Admitting: Emergency Medicine

## 2015-09-15 ENCOUNTER — Emergency Department (HOSPITAL_COMMUNITY): Payer: BLUE CROSS/BLUE SHIELD

## 2015-09-15 ENCOUNTER — Encounter (HOSPITAL_COMMUNITY): Payer: Self-pay | Admitting: Family Medicine

## 2015-09-15 DIAGNOSIS — J45909 Unspecified asthma, uncomplicated: Secondary | ICD-10-CM | POA: Insufficient documentation

## 2015-09-15 DIAGNOSIS — Z87442 Personal history of urinary calculi: Secondary | ICD-10-CM | POA: Diagnosis not present

## 2015-09-15 DIAGNOSIS — N23 Unspecified renal colic: Secondary | ICD-10-CM

## 2015-09-15 DIAGNOSIS — N132 Hydronephrosis with renal and ureteral calculous obstruction: Secondary | ICD-10-CM | POA: Insufficient documentation

## 2015-09-15 DIAGNOSIS — I10 Essential (primary) hypertension: Secondary | ICD-10-CM | POA: Insufficient documentation

## 2015-09-15 DIAGNOSIS — N201 Calculus of ureter: Secondary | ICD-10-CM | POA: Diagnosis present

## 2015-09-15 LAB — URINE MICROSCOPIC-ADD ON

## 2015-09-15 LAB — URINALYSIS, ROUTINE W REFLEX MICROSCOPIC
Bilirubin Urine: NEGATIVE
Glucose, UA: NEGATIVE mg/dL
Ketones, ur: 15 mg/dL — AB
Nitrite: NEGATIVE
Protein, ur: NEGATIVE mg/dL
Specific Gravity, Urine: 1.014 (ref 1.005–1.030)
pH: 8.5 — ABNORMAL HIGH (ref 5.0–8.0)

## 2015-09-15 MED ORDER — SODIUM CHLORIDE 0.9 % IV BOLUS (SEPSIS)
1000.0000 mL | Freq: Once | INTRAVENOUS | Status: AC
  Administered 2015-09-15: 1000 mL via INTRAVENOUS

## 2015-09-15 MED ORDER — ONDANSETRON HCL 4 MG/2ML IJ SOLN
4.0000 mg | Freq: Once | INTRAMUSCULAR | Status: AC | PRN
  Administered 2015-09-15: 4 mg via INTRAVENOUS
  Filled 2015-09-15: qty 2

## 2015-09-15 MED ORDER — KETOROLAC TROMETHAMINE 15 MG/ML IJ SOLN
15.0000 mg | Freq: Once | INTRAMUSCULAR | Status: AC
  Administered 2015-09-15: 15 mg via INTRAVENOUS
  Filled 2015-09-15: qty 1

## 2015-09-15 MED ORDER — HYDROMORPHONE HCL 2 MG/ML IJ SOLN
INTRAMUSCULAR | Status: AC
  Administered 2015-09-15: 1 mg
  Filled 2015-09-15: qty 1

## 2015-09-15 MED ORDER — PROMETHAZINE HCL 25 MG/ML IJ SOLN
12.5000 mg | Freq: Once | INTRAMUSCULAR | Status: AC
  Administered 2015-09-15: 12.5 mg via INTRAVENOUS
  Filled 2015-09-15: qty 1

## 2015-09-15 MED ORDER — HYDROMORPHONE HCL 1 MG/ML IJ SOLN: 1.0000 mg | Freq: Once | INTRAMUSCULAR | Status: AC

## 2015-09-15 MED ORDER — DIPHENHYDRAMINE HCL 50 MG/ML IJ SOLN
25.0000 mg | Freq: Once | INTRAMUSCULAR | Status: AC
  Administered 2015-09-15: 25 mg via INTRAVENOUS
  Filled 2015-09-15: qty 1

## 2015-09-15 NOTE — ED Notes (Signed)
Pt stated she is in pain and dehydrated and cannot give a urine sample at this time.

## 2015-09-15 NOTE — ED Notes (Signed)
Pt actively vomiting. Zofran given. See MAR.

## 2015-09-15 NOTE — ED Notes (Signed)
Patient transported to X-ray 

## 2015-09-15 NOTE — ED Notes (Signed)
Pt reports she has a 28mm kidney stone on the right side. Pt was at [facility] two days and urologist yesterday. Pt reports was told to take the Toradol for pain and attempt to pass the kidney stone. Pt reports she was given Zofran Rx for nausea at [facility] and is continuing to be nausea and vomiting.

## 2015-09-15 NOTE — ED Provider Notes (Signed)
CSN: [id number]     Arrival date & time 09/15/15  2100 History   By signing my name below, I, Forrestine Him, attest that this documentation has been prepared under the direction and in the presence of Quintella Reichert, MD.  Electronically Signed: Forrestine Him, ED Scribe. 09/15/2015. 11:17 PM.   Chief Complaint  Patient presents with  . Flank Pain  . Emesis   The history is provided by the patient. No language interpreter was used.    HPI Comments: Robin Wilson is a 55 y.o. female with a PMHx of kidney stones and HTN who presents to the Emergency Department complaining of constant, ongoing vomiting with associated nasuea x 2 days. Pt states she is unable to keep any fluids or solid foods down at this time. She also reports ongoing dysuria, decreased urinary output, and lower pressure like abdominal pain that radiates to her lower leg. No OTC medications or home remedies attempted prior to arrival. However, pt was diagnosed with a 6 mm kidney stone 2 days ago and has been taking prescribed Toradol for symptoms. She denies any improvement with medications as she has not been able to tolerate any tablets. No recent fever or chills. Ms. Cardo was evaluated by her Urologist yesterday for same. At time of visit, scan was performed, however, pt did not state if any changes were visualized.  PSHx includes cystoscopy/retrograde/ureteroscopy 2013.  PCP: Hulen Shouts, MD    Past Medical History  Diagnosis Date  . Kidney stones   . Hypertension   . Kidney stones    Past Surgical History  Procedure Laterality Date  . Uterine fibroid embolization    . Cesarean section    . Cystoscopy/retrograde/ureteroscopy  06/04/2012    Procedure: CYSTOSCOPY/RETROGRADE/URETEROSCOPY;  Surgeon: Ailene Rud, MD;  Location: [facility];  Service: Urology;  Laterality: Left;   History reviewed. No pertinent family history. Social History  Substance Use Topics  . Smoking status: Never Smoker   . Smokeless  tobacco: None  . Alcohol Use: No   OB History    No data available     Review of Systems  A complete 10 system review of systems was obtained and all systems are negative except as noted in the HPI and PMH.     Allergies  Sulfa drugs cross reactors  Home Medications   Prior to Admission medications   Medication Sig Start Date End Date Taking? Authorizing Provider  albuterol (PROVENTIL HFA;VENTOLIN HFA) 108 (90 Base) MCG/ACT inhaler Inhale 1-2 puffs into the lungs every 6 (six) hours as needed for wheezing or shortness of breath.   Yes Historical Provider, MD  calcium carbonate (OS-CAL - DOSED IN MG OF ELEMENTAL CALCIUM) 1250 MG tablet Take 1 tablet by mouth daily.     Yes Historical Provider, MD  cetirizine (ZYRTEC) 10 MG tablet Take 10 mg by mouth daily.    Yes Historical Provider, MD  cyclobenzaprine (FLEXERIL) 10 MG tablet Take 10 mg by mouth 3 (three) times daily as needed for muscle spasms.  09/11/15  Yes Historical Provider, MD  hydrOXYzine (ATARAX/VISTARIL) 10 MG tablet Take 1 tablet (10 mg total) by mouth every 4 (four) hours as needed for anxiety. 05/24/13  Yes Nicole Pisciotta, PA-C  ketorolac (TORADOL) 10 MG tablet Take 1 tablet by mouth 3 (three) times daily as needed for moderate pain.  09/14/15  Yes Historical Provider, MD  losartan-hydrochlorothiazide (HYZAAR) 100-25 MG per tablet Take 1 tablet by mouth daily.    Yes Historical Provider, MD  Multiple Vitamins-Minerals (MULTIVITAMIN PO) Take 1 tablet by mouth daily.    Yes Historical Provider, MD  ondansetron (ZOFRAN) 4 MG tablet Take 1 tablet (4 mg total) by mouth every 6 (six) hours. 09/13/15  Yes [place] Lions, PA-C  sertraline (ZOLOFT) 100 MG tablet Take 100 mg by mouth daily. In evening   Yes Historical Provider, MD  tamsulosin (FLOMAX) 0.4 MG CAPS capsule Take 1 capsule (0.4 mg total) by mouth daily. 09/13/15  Yes [place] Lions, PA-C  cephALEXin (KEFLEX) 500 MG capsule Take 1 capsule (500 mg total) by  mouth 2 (two) times daily. Patient not taking: Reported on 09/13/2015 05/24/13   Elmyra Ricks Pisciotta, PA-C  hydrocodone-acetaminophen (LORCET PLUS) 7.5-650 MG per tablet Take 1 tablet by mouth every 6 (six) hours as needed for pain. Patient not taking: Reported on 09/13/2015 06/04/12   Carolan Clines, MD  HYDROcodone-acetaminophen (NORCO/VICODIN) 5-325 MG per tablet Take 1-2 tablets by mouth every 6 hours as needed for pain. Patient not taking: Reported on 09/16/2015 05/24/13   Elmyra Ricks Pisciotta, PA-C  nitrofurantoin, macrocrystal-monohydrate, (MACROBID) 100 MG capsule Take 1 capsule (100 mg total) by mouth 2 (two) times daily. X 3 day to prevent post-op infection while stent in place 09/16/15   Alexis Frock, MD  oxyCODONE-acetaminophen (PERCOCET/ROXICET) 5-325 MG tablet Take 1-2 tablets by mouth every 6 (six) hours as needed for moderate pain or severe pain (post-operatively). 09/16/15   Alexis Frock, MD  promethazine (PHENERGAN) 25 MG tablet Take 1 tablet (25 mg total) by mouth every 6 (six) hours as needed for nausea. Patient not taking: Reported on 09/13/2015 05/24/13   Elmyra Ricks Pisciotta, PA-C  senna-docusate (SENOKOT-S) 8.6-50 MG tablet Take 1 tablet by mouth 2 (two) times daily. While taking pain meds to prevent constipation 09/16/15   Alexis Frock, MD   Triage Vitals: BP 142/90 mmHg  Pulse 105  Temp(Src) 98.6 F (37 C) (Oral)  Resp 18  SpO2 99%   Physical Exam  Constitutional: She is oriented to person, place, and time. She appears well-developed and well-nourished.  uncomfortable appearing   HENT:  Head: Normocephalic and atraumatic.  Cardiovascular: Normal rate and regular rhythm.   No murmur heard. Pulmonary/Chest: Effort normal and breath sounds normal. No respiratory distress.  Abdominal: Soft. There is tenderness. There is no rebound and no guarding.  Mild tenderness to RLQ   Musculoskeletal: She exhibits no edema or tenderness.  Neurological: She is alert and oriented to person,  place, and time.  Skin: Skin is warm and dry.  Psychiatric: She has a normal mood and affect. Her behavior is normal.  Nursing note and vitals reviewed.   ED Course  Procedures (including critical care time)  DIAGNOSTIC STUDIES: Oxygen Saturation is 100% on RA, Normal by my interpretation.    COORDINATION OF CARE: 11:14 PM- Will give Dilaudid, Toradol, fluids, Phenergan, and Zofran. Will order DG abd 1 view, BMP, CBC, and urinalysis. Discussed treatment plan with pt at bedside and pt agreed to plan.     1:30 AM- Reevaluated pt and she is now pain free.  Labs Review Labs Reviewed  URINALYSIS, ROUTINE W REFLEX MICROSCOPIC (NOT AT [facility]) - Abnormal; Notable for the following:    APPearance CLOUDY (*)    pH 8.5 (*)    Hgb urine dipstick SMALL (*)    Ketones, ur 15 (*)    Leukocytes, UA TRACE (*)    All other components within normal limits  BASIC METABOLIC PANEL - Abnormal; Notable for the following:  Glucose, Bld 110 (*)    All other components within normal limits  CBC WITH DIFFERENTIAL/PLATELET - Abnormal; Notable for the following:    WBC 11.5 (*)    RBC 3.67 (*)    MCH 35.1 (*)    Neutro Abs 9.4 (*)    All other components within normal limits  URINE MICROSCOPIC-ADD ON - Abnormal; Notable for the following:    Squamous Epithelial / LPF 0-5 (*)    Bacteria, UA FEW (*)    All other components within normal limits    Imaging Review Dg Abd 1 View  09/16/2015  CLINICAL DATA:  Nausea vomiting, RIGHT abdominal pain for 2 days. History of kidney stones. EXAM: ABDOMEN - 1 VIEW COMPARISON:  CT abdomen and pelvis September 12, 2014 FINDINGS: Small ovoid calcification RIGHT abdomen, corresponding to known nephrolithiasis. Faint possible RIGHT urolithiasis. Multiple LEFT small nephrolithiasis. Moderate amount of retained large bowel stool, normal bowel gas pattern. Phleboliths in the pelvis. No intra-abdominal mass effect. No acute osseous process. IMPRESSION: Bilateral nephrolithiasis,  corresponding to CT abnormality. Probable persistent RIGHT urolithiasis. Moderate amount of retained large bowel stool, normal bowel gas pattern. Electronically Signed   By: Elon Alas M.D.   On: 09/16/2015 00:01   I have personally reviewed and evaluated these images and lab results as part of my medical decision-making.   EKG Interpretation None      MDM   Final diagnoses:  Renal colic on right side   Patient with history of renal colic, recent CT scan with 6 mm stone. She is here with persistent pain. Her pain transiently improved with medications in the department only to recur. She did develop hives with the Dilaudid and Toradol administration. She states she had this before and she is not sure which medication causes it. She did receive a dose of Benadryl. She received fentanyl for her recurrent episode of pain and she did not receive hives with that medication. She is requesting stone removal given her ongoing pain. Discussed with Dr. Tresa Moore with urology and he agrees to see the patient.  I personally performed the services described in this documentation, which was scribed in my presence. The recorded information has been reviewed and is accurate.   Quintella Reichert, MD 09/16/15 [phone]

## 2015-09-15 NOTE — ED Notes (Signed)
MD at bedside. 

## 2015-09-16 ENCOUNTER — Encounter (HOSPITAL_COMMUNITY): Payer: Self-pay | Admitting: Urology

## 2015-09-16 ENCOUNTER — Emergency Department (HOSPITAL_COMMUNITY): Payer: BLUE CROSS/BLUE SHIELD | Admitting: Registered Nurse

## 2015-09-16 ENCOUNTER — Encounter (HOSPITAL_COMMUNITY)
Admission: EM | Disposition: A | Discharge status: Home / Self Care | Payer: Self-pay | Source: Home / Self Care | Attending: Emergency Medicine

## 2015-09-16 HISTORY — PX: CYSTOSCOPY WITH RETROGRADE PYELOGRAM, URETEROSCOPY AND STENT PLACEMENT: SHX5789

## 2015-09-16 LAB — BASIC METABOLIC PANEL
Anion gap: 13 (ref 5–15)
BUN: 19 mg/dL (ref 6–20)
CO2: 28 mmol/L (ref 22–32)
Calcium: 9.6 mg/dL (ref 8.9–10.3)
Chloride: 101 mmol/L (ref 101–111)
Creatinine, Ser: 0.79 mg/dL (ref 0.44–1.00)
GFR calc Af Amer: >60 mL/min (ref >60)
GFR calc non Af Amer: >60 mL/min (ref >60)
Glucose, Bld: 110 mg/dL — ABNORMAL HIGH (ref 65–99)
Potassium: 3.6 mmol/L (ref 3.5–5.1)
Sodium: 142 mmol/L (ref 135–145)

## 2015-09-16 LAB — CBC WITH DIFFERENTIAL/PLATELET
Basophils Absolute: 0 10*3/uL (ref 0.0–0.1)
Basophils Relative: 0 %
Eosinophils Absolute: 0.2 10*3/uL (ref 0.0–0.7)
Eosinophils Relative: 2 %
HCT: 36 % (ref 36.0–46.0)
Hemoglobin: 12.9 g/dL (ref 12.0–15.0)
Lymphocytes Relative: 11 %
Lymphs Abs: 1.2 10*3/uL (ref 0.7–4.0)
MCH: 35.1 pg — ABNORMAL HIGH (ref 26.0–34.0)
MCHC: 35.8 g/dL (ref 30.0–36.0)
MCV: 98.1 fL (ref 78.0–100.0)
Monocytes Absolute: 0.7 10*3/uL (ref 0.1–1.0)
Monocytes Relative: 6 %
Neutro Abs: 9.4 10*3/uL — ABNORMAL HIGH (ref 1.7–7.7)
Neutrophils Relative %: 81 %
Platelets: 295 10*3/uL (ref 150–400)
RBC: 3.67 MIL/uL — ABNORMAL LOW (ref 3.87–5.11)
RDW: 12.3 % (ref 11.5–15.5)
WBC: 11.5 10*3/uL — ABNORMAL HIGH (ref 4.0–10.5)

## 2015-09-16 SURGERY — CYSTOURETEROSCOPY, WITH RETROGRADE PYELOGRAM AND STENT INSERTION
Anesthesia: General | Site: Ureter | Laterality: Right

## 2015-09-16 MED ORDER — LACTATED RINGERS IV SOLN
INTRAVENOUS | Status: DC | PRN
  Administered 2015-09-16: 05:00:00 via INTRAVENOUS

## 2015-09-16 MED ORDER — SCOPOLAMINE 1 MG/3DAYS TD PT72
MEDICATED_PATCH | TRANSDERMAL | Status: AC
  Filled 2015-09-16: qty 1

## 2015-09-16 MED ORDER — SODIUM CHLORIDE 0.9 % IR SOLN
Status: DC | PRN
  Administered 2015-09-16: 3000 mL

## 2015-09-16 MED ORDER — SUGAMMADEX SODIUM 200 MG/2ML IV SOLN
INTRAVENOUS | Status: DC | PRN
  Administered 2015-09-16: 110 mg via INTRAVENOUS

## 2015-09-16 MED ORDER — OXYCODONE-ACETAMINOPHEN 5-325 MG PO TABS: 1.0000 | ORAL_TABLET | Freq: Four times a day (QID) | ORAL | Status: DC | PRN

## 2015-09-16 MED ORDER — LIDOCAINE HCL (CARDIAC) 20 MG/ML IV SOLN
INTRAVENOUS | Status: DC | PRN
  Administered 2015-09-16: 50 mg via INTRAVENOUS

## 2015-09-16 MED ORDER — ONDANSETRON HCL 4 MG/2ML IJ SOLN
INTRAMUSCULAR | Status: DC | PRN
  Administered 2015-09-16: 4 mg via INTRAVENOUS

## 2015-09-16 MED ORDER — SODIUM CHLORIDE 0.9 % IV BOLUS (SEPSIS)
1000.0000 mL | Freq: Once | INTRAVENOUS | Status: AC
  Administered 2015-09-16: 1000 mL via INTRAVENOUS

## 2015-09-16 MED ORDER — ONDANSETRON HCL 4 MG/2ML IJ SOLN
INTRAMUSCULAR | Status: AC
  Filled 2015-09-16: qty 2

## 2015-09-16 MED ORDER — LIDOCAINE HCL (CARDIAC) 20 MG/ML IV SOLN
INTRAVENOUS | Status: AC
  Filled 2015-09-16: qty 5

## 2015-09-16 MED ORDER — ROCURONIUM BROMIDE 100 MG/10ML IV SOLN
INTRAVENOUS | Status: DC | PRN
  Administered 2015-09-16: 15 mg via INTRAVENOUS

## 2015-09-16 MED ORDER — FENTANYL CITRATE (PF) 100 MCG/2ML IJ SOLN
INTRAMUSCULAR | Status: AC
  Filled 2015-09-16: qty 2

## 2015-09-16 MED ORDER — SODIUM CHLORIDE 0.9 % IV SOLN
INTRAVENOUS | Status: DC
  Administered 2015-09-16: 250 mL via INTRAVENOUS

## 2015-09-16 MED ORDER — 0.9 % SODIUM CHLORIDE (POUR BTL) OPTIME
TOPICAL | Status: DC | PRN
  Administered 2015-09-16: 1000 mL

## 2015-09-16 MED ORDER — PROMETHAZINE HCL 25 MG/ML IJ SOLN
INTRAMUSCULAR | Status: AC
  Filled 2015-09-16: qty 1

## 2015-09-16 MED ORDER — PROMETHAZINE HCL 25 MG/ML IJ SOLN
6.2500 mg | INTRAMUSCULAR | Status: DC | PRN
  Administered 2015-09-16: 6.25 mg via INTRAVENOUS

## 2015-09-16 MED ORDER — HYDROMORPHONE HCL 1 MG/ML IJ SOLN: 0.2500 mg | INTRAMUSCULAR | Status: DC | PRN

## 2015-09-16 MED ORDER — FENTANYL CITRATE (PF) 100 MCG/2ML IJ SOLN
INTRAMUSCULAR | Status: DC | PRN
  Administered 2015-09-16: 25 ug via INTRAVENOUS
  Administered 2015-09-16: 50 ug via INTRAVENOUS

## 2015-09-16 MED ORDER — SUCCINYLCHOLINE CHLORIDE 20 MG/ML IJ SOLN
INTRAMUSCULAR | Status: DC | PRN
  Administered 2015-09-16: 100 mg via INTRAVENOUS

## 2015-09-16 MED ORDER — DEXAMETHASONE SODIUM PHOSPHATE 10 MG/ML IJ SOLN
INTRAMUSCULAR | Status: DC | PRN
  Administered 2015-09-16: 10 mg via INTRAVENOUS

## 2015-09-16 MED ORDER — PROPOFOL 10 MG/ML IV BOLUS
INTRAVENOUS | Status: DC | PRN
  Administered 2015-09-16: 170 mg via INTRAVENOUS

## 2015-09-16 MED ORDER — SCOPOLAMINE 1 MG/3DAYS TD PT72
MEDICATED_PATCH | TRANSDERMAL | Status: DC | PRN
  Administered 2015-09-16: 1 via TRANSDERMAL

## 2015-09-16 MED ORDER — SENNOSIDES-DOCUSATE SODIUM 8.6-50 MG PO TABS: 1.0000 | ORAL_TABLET | Freq: Two times a day (BID) | ORAL | Status: DC

## 2015-09-16 MED ORDER — DEXAMETHASONE SODIUM PHOSPHATE 10 MG/ML IJ SOLN
INTRAMUSCULAR | Status: AC
  Filled 2015-09-16: qty 1

## 2015-09-16 MED ORDER — FENTANYL CITRATE (PF) 100 MCG/2ML IJ SOLN
75.0000 ug | Freq: Once | INTRAMUSCULAR | Status: AC
  Administered 2015-09-16: 75 ug via INTRAVENOUS
  Filled 2015-09-16: qty 2

## 2015-09-16 MED ORDER — PROPOFOL 10 MG/ML IV BOLUS
INTRAVENOUS | Status: AC
  Filled 2015-09-16: qty 20

## 2015-09-16 MED ORDER — SUGAMMADEX SODIUM 200 MG/2ML IV SOLN
INTRAVENOUS | Status: AC
  Filled 2015-09-16: qty 2

## 2015-09-16 MED ORDER — GENTAMICIN SULFATE 40 MG/ML IJ SOLN
5.0000 mg/kg | Freq: Once | INTRAVENOUS | Status: AC
  Administered 2015-09-16: 270 mg via INTRAVENOUS
  Filled 2015-09-16: qty 6.75

## 2015-09-16 MED ORDER — NITROFURANTOIN MONOHYD MACRO 100 MG PO CAPS: 100.0000 mg | ORAL_CAPSULE | Freq: Two times a day (BID) | ORAL | Status: DC

## 2015-09-16 MED ORDER — IOHEXOL 300 MG/ML  SOLN
INTRAMUSCULAR | Status: DC | PRN
  Administered 2015-09-16: 18 mL

## 2015-09-16 SURGICAL SUPPLY — 25 items
BASKET LASER NITINOL 1.9FR (BASKET) ×2 IMPLANT
BASKET STNLS GEMINI 4WIRE 3FR (BASKET) IMPLANT
BASKET ZERO TIP NITINOL 2.4FR (BASKET) IMPLANT
BSKT STON RTRVL 120 1.9FR (BASKET) ×1
BSKT STON RTRVL GEM 120X11 3FR (BASKET)
BSKT STON RTRVL ZERO TP 2.4FR (BASKET)
CATH INTERMIT  6FR 70CM (CATHETERS) ×2 IMPLANT
CLOTH BEACON ORANGE TIMEOUT ST (SAFETY) ×2 IMPLANT
ELECT REM PT RETURN 9FT ADLT (ELECTROSURGICAL)
ELECTRODE REM PT RTRN 9FT ADLT (ELECTROSURGICAL) IMPLANT
FIBER LASER FLEXIVA 1000 (UROLOGICAL SUPPLIES) IMPLANT
FIBER LASER FLEXIVA 200 (UROLOGICAL SUPPLIES) ×2 IMPLANT
FIBER LASER FLEXIVA 365 (UROLOGICAL SUPPLIES) IMPLANT
FIBER LASER FLEXIVA 550 (UROLOGICAL SUPPLIES) IMPLANT
FIBER LASER TRAC TIP (UROLOGICAL SUPPLIES) IMPLANT
GLOVE BIOGEL M STRL SZ7.5 (GLOVE) ×2 IMPLANT
GOWN STRL REUS W/TWL XL LVL3 (GOWN DISPOSABLE) ×4 IMPLANT
GUIDEWIRE ANG ZIPWIRE 038X150 (WIRE) ×2 IMPLANT
GUIDEWIRE STR DUAL SENSOR (WIRE) ×2 IMPLANT
IV NS IRRIG 3000ML ARTHROMATIC (IV SOLUTION) ×2 IMPLANT
PACK CYSTO (CUSTOM PROCEDURE TRAY) ×2 IMPLANT
STENT POLARIS 5FRX24 (STENTS) ×2 IMPLANT
SYRINGE 10CC LL (SYRINGE) IMPLANT
SYRINGE IRR TOOMEY STRL 70CC (SYRINGE) IMPLANT
TUBE FEEDING 8FR 16IN STR KANG (MISCELLANEOUS) ×2 IMPLANT

## 2015-09-16 NOTE — Anesthesia Preprocedure Evaluation (Addendum)
Anesthesia Evaluation  Patient identified by MRN, date of birth, ID band Patient awake    Reviewed: Allergy & Precautions, H&P , NPO status , Patient's Chart, lab work & pertinent test results  Airway Mallampati: II  TM Distance: >3 FB Neck ROM: full    Dental no notable dental hx. (+) Teeth Intact   Pulmonary asthma ,    breath sounds clear to auscultation       Cardiovascular hypertension, On Medications  Rhythm:regular Rate:Normal     Neuro/Psych negative neurological ROS  negative psych ROS   GI/Hepatic negative GI ROS, Neg liver ROS,   Endo/Other  negative endocrine ROS  Renal/GU      Musculoskeletal   Abdominal   Peds  Hematology   Anesthesia Other Findings   Reproductive/Obstetrics negative OB ROS                            Anesthesia Physical Anesthesia Plan  ASA: II and emergent  Anesthesia Plan: General ETT   Post-op Pain Management:    Induction:   Airway Management Planned:   Additional Equipment:   Intra-op Plan:   Post-operative Plan:   Informed Consent: I have reviewed the patients History and Physical, chart, labs and discussed the procedure including the risks, benefits and alternatives for the proposed anesthesia with the patient or authorized representative who has indicated his/her understanding and acceptance.   Dental Advisory Given  Plan Discussed with: Anesthesiologist, CRNA and Surgeon  Anesthesia Plan Comments:         Anesthesia Quick Evaluation

## 2015-09-16 NOTE — ED Notes (Signed)
Pt developed small hives on face and some itchiness. MD made aware and Benadryl was prescribed. Will continue to monitor

## 2015-09-16 NOTE — Anesthesia Postprocedure Evaluation (Signed)
Anesthesia Post Note  Patient: Robin Wilson  Procedure(s) Performed: Procedure(s) (LRB): CYSTOSCOPY WITH RETROGRADE PYELOGRAM, URETEROSCOPY AND STENT PLACEMENT WITH HOLMIUM LASER ABLATION  (Right)  Patient location during evaluation: PACU Anesthesia Type: General Level of consciousness: sedated Pain management: pain level controlled Vital Signs Assessment: post-procedure vital signs reviewed and stable Respiratory status: spontaneous breathing and respiratory function stable Cardiovascular status: stable Anesthetic complications: no    Last Vitals:  Filed Vitals:   09/16/15 0730 09/16/15 0800  BP: 140/73 140/80  Pulse: 92 80  Temp: 36.8 C 36.7 C  Resp: 16 16    Last Pain:  Filed Vitals:   09/16/15 0808  PainSc: [place]

## 2015-09-16 NOTE — Brief Op Note (Signed)
09/15/2015 - 09/16/2015  5:42 AM  PATIENT:  Robin Wilson  55 y.o. female  PRE-OPERATIVE DIAGNOSIS:  rt. ureteral stone, pain and vomitting  POST-OPERATIVE DIAGNOSIS:  right ureteral stone   PROCEDURE:  Procedure(s): CYSTOSCOPY WITH RETROGRADE PYELOGRAM, URETEROSCOPY AND STENT PLACEMENT WITH HOLMIUM LASER ABLATION  (Right)  SURGEON:  Surgeon(s) and Role:    * Alexis Frock, MD - Primary  PHYSICIAN ASSISTANT:   ASSISTANTS: none   ANESTHESIA:   general  EBL:     BLOOD ADMINISTERED:none  DRAINS: none   LOCAL MEDICATIONS USED:  NONE  SPECIMEN:  Source of Specimen:  left ureteral stone fragments  DISPOSITION OF SPECIMEN:  [facility] for compositional analysis  COUNTS:  YES  TOURNIQUET:  * No tourniquets in log *  DICTATION: .Other Dictation: Dictation Number [id number]  PLAN OF CARE: Discharge to home after PACU  PATIENT DISPOSITION:  PACU - hemodynamically stable.   Delay start of Pharmacological VTE agent (>24hrs) due to surgical blood loss or risk of bleeding: yes

## 2015-09-16 NOTE — Op Note (Signed)
[name], MINIX NO.:  [id number]  MEDICAL RECORD NO.:  [id number]  LOCATION:                                 FACILITY:  PHYSICIAN:  Alexis Frock, MD     DATE OF BIRTH:  [birth year]  DATE OF PROCEDURE: 09/16/2015                              OPERATIVE REPORT   DIAGNOSIS:  Right ureteral stone, refractory colic.  PROCEDURE: 1. Cystoscopy with right retrograde pyelogram interpretation. 2. Right ureteroscopy laser lithotripsy. 3. Insertion of right ureteral stent, 5 x 24 Polaris with tether.  ESTIMATED BLOOD LOSS:  Nil.  COMPLICATION:  None.  SPECIMENS:  Right ureteral stone fragments for compositional analysis.  FINDINGS: 1. Very mild hydroureteronephrosis to a filling defect in the mid     ureter consistent with known ureteral stone. 2. Significant tortuosity of the right ureter. 3. Likely lower pole nonobstructing calcification by a spot     fluoroscopic images. 4. Complete resolution of all stone fragments larger than 1/3rd mm     within the lumen of the right ureter following laser lithotripsy     and basket extraction. 5. Successful placement of right ureteral stent, proximal in renal     pelvis and distal in urinary bladder.  INDICATION:  Ms. Robin Wilson is a pleasant 55 year old lady with long history of recurrent nephrolithiasis status post multiple treatments including medical therapy, shockwave lithotripsy, and prior ureteroscopy.  She was found on workup of colicky right flank pain to have a right approximately 6 mm ureteral stone by imaging several days ago.  In the emergency room, was given initial trial of medical therapy for this. However, she returned to the emergency room late last night with a refractory colic with pain, nausea, and vomiting, requiring multiple rounds of IV narcotics and IV antiemetics and her symptoms were refractory.  Options were discussed for management including admission with medical management only versus  urgent definitive therapy with a right ureteroscopy for her obstructing stone and she wished to proceed with the latter.  Informed consent was obtained and placed in medical record.  PROCEDURE IN DETAIL:  The patient being Robin Wilson was verified. Procedure being right ureteroscopic stone manipulation confirmed. Procedure was carried out.  Time-out was performed.  Intravenous antibiotics were administered.  General endotracheal anesthesia introduced.  Patient was placed into a low lithotomy position and sterile field was created by prepping and draping the patient's vagina, introitus, and proximal thighs using iodine x3.  Next, cystourethroscopy was performed.  A 21-French rigid cystoscope with a 30-degree offset lens.  Inspection of bladder revealed no diverticula, calcifications, papular lesions.  Ureteral orifices appeared singleton bilaterally.  The right ureteral orifice was gently cannulated with 6-French end-hole catheter and right retrograde pyelogram was obtained.  Right retrograde pyelogram demonstrated a single right ureter wit single- system right kidney.  There was significant tortuosity of the ureter. There was a mobile filling defect in the mid ureter with mild hydroureteronephrosis above this consistent with known stone.  A 0.038 Zip wire was advanced at the level of upper pole and set aside as a safety wire.  An 8-French feeding tube placed in the urinary bladder for pressure release.  Next, semi-rigid ureteroscopy was performed in the distal right ureter alongside a separate Sensor working wire.  As expected, a calcification was noted in the mid ureter consistent with known stone.  This appeared to be much too large for simple basketing as its holmium laser energy applied to the stone using settings of 0.2 joules and 10 hertz, fragmenting the stone approximately 4 smaller pieces.  These were then grasped sequentially on their long axis and removed in their entirety,  set aside for compositional analysis.  Repeat inspection of the distal 3/4th of the right ureter.  Following these maneuvers, we felt complete resolution of all stone fragments larger than 1/3rd mm.  No evidence of perforation.  Final right retrograde pyelogram was obtained which revealed no further intraluminal filling defects.  Given the tortuosity of her right ureter in the urgent setting, it was not felt that management of her nonobstructing favorable location lower pole stone would be safe or advantageous.  Before the ureteroscope was removed, under continuous vision, no mucosal abnormalities were found and a new 5 x 24 Polaris-type stent was placed with remaining safety wire using fluoroscopic guidance.  Good proximal distal deployment were noted.  Bladder was emptied per cystoscope. Procedure was terminated after a tether was fashioned to the inner thigh.  The patient tolerated the procedure well with no immediate periprocedural complications.  The patient will be taken to postanesthesia care in stable condition with a plan for discharge from the postanesthesia care unit if she recovers well.          ______________________________ Alexis Frock, MD     TM/MEDQ  D:  09/16/2015  T:  09/16/2015  Job:  [id number]

## 2015-09-16 NOTE — Transfer of Care (Signed)
Immediate Anesthesia Transfer of Care Note  Patient: Robin Wilson  Procedure(s) Performed: Procedure(s): CYSTOSCOPY WITH RETROGRADE PYELOGRAM, URETEROSCOPY AND STENT PLACEMENT WITH HOLMIUM LASER ABLATION  (Right)  Patient Location: PACU  Anesthesia Type:General  Level of Consciousness: awake, alert , oriented and patient cooperative  Airway & Oxygen Therapy: Patient Spontanous Breathing and Patient connected to face mask oxygen  Post-op Assessment: Report given to RN, Post -op Vital signs reviewed and stable and Patient moving all extremities  Post vital signs: Reviewed and stable  Last Vitals:  Filed Vitals:   09/16/15 0230 09/16/15 0230  BP: 126/89 120/75  Pulse: 80 88  Temp:    Resp:  16    Complications: No apparent anesthesia complications

## 2015-09-16 NOTE — H&P (Signed)
Robin Wilson is an 55 y.o. female.     Chief Complaint: Rt Ureteral Stone with Refractory Colic  HPI:   1 - Nephrolithiasis -  Pre 2017 - URS, MET, SWL all x several. 08/2015 - Rt proximal 46m ureteral stone and bilateral non-obstructing lower pole stones by ER CT 12/40/97on eval colic. Given trial of medical therapy and represents with refractory pain with emesis.  No fevers or significant leukocytosis. No CV disease or strong blood thinners. GFR normal.   Past Medical History  Diagnosis Date  . Kidney stones   . Hypertension   . Kidney stones     Past Surgical History  Procedure Laterality Date  . Uterine fibroid embolization    . Cesarean section    . Cystoscopy/retrograde/ureteroscopy  06/04/2012    Procedure: CYSTOSCOPY/RETROGRADE/URETEROSCOPY;  Surgeon: Robin Rud MD;  Location: [facility];  Service: Urology;  Laterality: Left;    History reviewed. No pertinent family history. Social History:  reports that she has never smoked. She does not have any smokeless tobacco history on file. She reports that she does not drink alcohol or use illicit drugs.  Allergies:  Allergies  Allergen Reactions  . Sulfa Drugs Cross Reactors     From childhood per pt's mother , pt is unaware of allergy status     (Not in a hospital admission)  Results for orders placed or performed during the hospital encounter of 09/15/15 (from the past 48 hour(s))  Urinalysis, [facility] w reflex microscopic [facility]     Status: Abnormal   Collection Time: 09/15/15 11:15 PM  Result Value Ref Range   Color, Urine YELLOW YELLOW   APPearance CLOUDY (A) CLEAR   Specific Gravity, Urine 1.014 1.005 - 1.030   pH 8.5 (H) 5.0 - 8.0   Glucose, UA NEGATIVE NEGATIVE mg/dL   Hgb urine dipstick SMALL (A) NEGATIVE   Bilirubin Urine NEGATIVE NEGATIVE   Ketones, ur 15 (A) NEGATIVE mg/dL   Protein, ur NEGATIVE NEGATIVE mg/dL   Nitrite NEGATIVE NEGATIVE   Leukocytes, UA TRACE (A) NEGATIVE  Urine  microscopic-add on     Status: Abnormal   Collection Time: 09/15/15 11:15 PM  Result Value Ref Range   Squamous Epithelial / LPF 0-5 (A) NONE SEEN   WBC, UA 6-30 0 - 5 WBC/hpf   RBC / HPF TOO NUMEROUS TO COUNT 0 - 5 RBC/hpf   Bacteria, UA FEW (A) NONE SEEN  Basic metabolic panel     Status: Abnormal   Collection Time: 09/15/15 11:22 PM  Result Value Ref Range   Sodium 142 135 - 145 mmol/L   Potassium 3.6 3.5 - 5.1 mmol/L   Chloride 101 101 - 111 mmol/L   CO2 28 22 - 32 mmol/L   Glucose, Bld 110 (H) 65 - 99 mg/dL   BUN 19 6 - 20 mg/dL   Creatinine, Ser 0.79 0.44 - 1.00 mg/dL   Calcium 9.6 8.9 - 10.3 mg/dL   GFR calc non Af Amer >60 >60 mL/min   GFR calc Af Amer >60 >60 mL/min    Comment: (NOTE) The eGFR has been calculated using the CKD EPI equation. This calculation has not been validated in all clinical situations. eGFR's persistently <60 mL/min signify possible Chronic Kidney Disease.    Anion gap 13 5 - 15  CBC with Differential     Status: Abnormal   Collection Time: 09/15/15 11:22 PM  Result Value Ref Range   WBC 11.5 (H) 4.0 - 10.5  K/uL   RBC 3.67 (L) 3.87 - 5.11 MIL/uL   Hemoglobin 12.9 12.0 - 15.0 g/dL   HCT 36.0 36.0 - 46.0 %   MCV 98.1 78.0 - 100.0 fL   MCH 35.1 (H) 26.0 - 34.0 pg   MCHC 35.8 30.0 - 36.0 g/dL   RDW 12.3 11.5 - 15.5 %   Platelets 295 150 - 400 K/uL   Neutrophils Relative % 81 %   Neutro Abs 9.4 (H) 1.7 - 7.7 K/uL   Lymphocytes Relative 11 %   Lymphs Abs 1.2 0.7 - 4.0 K/uL   Monocytes Relative 6 %   Monocytes Absolute 0.7 0.1 - 1.0 K/uL   Eosinophils Relative 2 %   Eosinophils Absolute 0.2 0.0 - 0.7 K/uL   Basophils Relative 0 %   Basophils Absolute 0.0 0.0 - 0.1 K/uL   Dg Abd 1 View  09/16/2015  CLINICAL DATA:  Nausea vomiting, RIGHT abdominal pain for 2 days. History of kidney stones. EXAM: ABDOMEN - 1 VIEW COMPARISON:  CT abdomen and pelvis September 12, 2014 FINDINGS: Small ovoid calcification RIGHT abdomen, corresponding to known  nephrolithiasis. Faint possible RIGHT urolithiasis. Multiple LEFT small nephrolithiasis. Moderate amount of retained large bowel stool, normal bowel gas pattern. Phleboliths in the pelvis. No intra-abdominal mass effect. No acute osseous process. IMPRESSION: Bilateral nephrolithiasis, corresponding to CT abnormality. Probable persistent RIGHT urolithiasis. Moderate amount of retained large bowel stool, normal bowel gas pattern. Electronically Signed   By: Elon Alas M.D.   On: 09/16/2015 00:01    Review of Systems  Constitutional: Negative for fever and chills.  HENT: Negative.   Eyes: Negative.   Respiratory: Negative.   Cardiovascular: Negative.   Gastrointestinal: Positive for nausea and vomiting.  Genitourinary: Positive for flank pain.  Musculoskeletal: Negative.   Skin: Negative.   Neurological: Negative.   Endo/Heme/Allergies: Negative.   Psychiatric/Behavioral: Negative.     Blood pressure 120/75, pulse 88, temperature 98.8 F (37.1 C), temperature source Oral, resp. rate 16, SpO2 97 %. Physical Exam  Constitutional: She appears well-developed.  HENT:  Head: Normocephalic.  Eyes: Pupils are equal, round, and reactive to light.  Neck: Normal range of motion.  Cardiovascular: Normal rate.   Respiratory: Effort normal.  GI: Soft.  Genitourinary:  Mild rt CVAT  Musculoskeletal: Normal range of motion.  Neurological: She is alert.  Skin: Skin is warm.  Psychiatric: She has a normal mood and affect. Her behavior is normal. Judgment and thought content normal.     Assessment/Plan  1 - Nephrolithiasis - Pt with refractory colic. Discussed options of definitive treatment with urgent ureteroscopy for obstructing stone v. Stent pending internal anatomy and she wishes to proceed. Risks, benefits, alternatives discussed. Specifically discussed will not plan to manage non-obstructing lower pole stones that are in low risk location in urgent setting. Likely DC home after  procedure.  She voiced understanding and desire to proceed as per above.   Robin Wilson 09/16/2015, 3:55 AM

## 2015-09-16 NOTE — ED Notes (Addendum)
Surgeon caught [place] midway to the OR and spoke to pt on the way to the OR. Therefore, I gave the consent to him for him to fill out with her. OR nurse [facility]) made aware.  I also informed him that the patient had not yet been wiped down because he ordered me to bring her to the OR STAT. He said that was fine. Pharmacy sent the antibiotic to the OR.

## 2015-09-16 NOTE — Discharge Instructions (Signed)
1 - You may have urinary urgency (bladder spasms) and bloody urine on / off with stent in place. This is normal. ° °2 - Call MD or go to ER for fever >102, severe pain / nausea / vomiting not relieved by medications, or acute change in medical status ° °3 - Remove tethered stent on Friday morning at home by pulling on string, then blue-white plastic tubing, and discarding. ° ° °

## 2015-09-16 NOTE — Anesthesia Procedure Notes (Signed)
Procedure Name: Intubation Date/Time: 09/16/2015 4:59 AM Performed by: Carleene Cooper A Pre-anesthesia Checklist: Patient identified, Emergency Drugs available, Suction available, Patient being monitored and Timeout performed Patient Re-evaluated:Patient Re-evaluated prior to inductionOxygen Delivery Method: Circle system utilized Preoxygenation: Pre-oxygenation with 100% oxygen Intubation Type: Cricoid Pressure applied, Rapid sequence and IV induction Laryngoscope Size: Mac and 3 Grade View: Grade I Tube type: Oral Tube size: 7.0 mm Number of attempts: 1 Airway Equipment and Method: Stylet Placement Confirmation: ETT inserted through vocal cords under direct vision,  positive ETCO2 and breath sounds checked- equal and bilateral Secured at: 21 cm Tube secured with: Tape Dental Injury: Teeth and Oropharynx as per pre-operative assessment

## 2016-03-10 DIAGNOSIS — L853 Xerosis cutis: Secondary | ICD-10-CM | POA: Diagnosis not present

## 2016-03-10 DIAGNOSIS — L299 Pruritus, unspecified: Secondary | ICD-10-CM | POA: Diagnosis not present

## 2016-03-10 DIAGNOSIS — Z79631 Long term (current) use of antimetabolite agent: Secondary | ICD-10-CM | POA: Insufficient documentation

## 2016-03-10 DIAGNOSIS — L2084 Intrinsic (allergic) eczema: Secondary | ICD-10-CM | POA: Diagnosis not present

## 2016-03-10 DIAGNOSIS — Z79899 Other long term (current) drug therapy: Secondary | ICD-10-CM | POA: Diagnosis not present

## 2016-05-26 DIAGNOSIS — M533 Sacrococcygeal disorders, not elsewhere classified: Secondary | ICD-10-CM | POA: Diagnosis not present

## 2016-06-13 DIAGNOSIS — Z79899 Other long term (current) drug therapy: Secondary | ICD-10-CM | POA: Diagnosis not present

## 2016-06-13 DIAGNOSIS — L2089 Other atopic dermatitis: Secondary | ICD-10-CM | POA: Diagnosis not present

## 2016-06-22 DIAGNOSIS — Z0000 Encounter for general adult medical examination without abnormal findings: Secondary | ICD-10-CM | POA: Diagnosis not present

## 2016-06-22 DIAGNOSIS — E785 Hyperlipidemia, unspecified: Secondary | ICD-10-CM | POA: Diagnosis not present

## 2016-06-22 DIAGNOSIS — E538 Deficiency of other specified B group vitamins: Secondary | ICD-10-CM | POA: Diagnosis not present

## 2016-06-22 DIAGNOSIS — D508 Other iron deficiency anemias: Secondary | ICD-10-CM | POA: Diagnosis not present

## 2016-07-13 DIAGNOSIS — Z1231 Encounter for screening mammogram for malignant neoplasm of breast: Secondary | ICD-10-CM | POA: Diagnosis not present

## 2016-07-13 DIAGNOSIS — Z13820 Encounter for screening for osteoporosis: Secondary | ICD-10-CM | POA: Diagnosis not present

## 2016-07-13 DIAGNOSIS — Z01419 Encounter for gynecological examination (general) (routine) without abnormal findings: Secondary | ICD-10-CM | POA: Diagnosis not present

## 2016-07-13 DIAGNOSIS — Z681 Body mass index (BMI) 19 or less, adult: Secondary | ICD-10-CM | POA: Diagnosis not present

## 2016-07-13 DIAGNOSIS — N390 Urinary tract infection, site not specified: Secondary | ICD-10-CM | POA: Diagnosis not present

## 2016-09-26 DIAGNOSIS — D126 Benign neoplasm of colon, unspecified: Secondary | ICD-10-CM | POA: Diagnosis not present

## 2016-09-26 DIAGNOSIS — D120 Benign neoplasm of cecum: Secondary | ICD-10-CM | POA: Diagnosis not present

## 2016-09-26 DIAGNOSIS — Z86010 Personal history of colonic polyps: Secondary | ICD-10-CM | POA: Diagnosis not present

## 2016-09-26 DIAGNOSIS — K621 Rectal polyp: Secondary | ICD-10-CM | POA: Diagnosis not present

## 2016-09-30 DIAGNOSIS — D120 Benign neoplasm of cecum: Secondary | ICD-10-CM | POA: Diagnosis not present

## 2016-09-30 DIAGNOSIS — K621 Rectal polyp: Secondary | ICD-10-CM | POA: Diagnosis not present

## 2016-09-30 DIAGNOSIS — D126 Benign neoplasm of colon, unspecified: Secondary | ICD-10-CM | POA: Diagnosis not present

## 2016-10-18 DIAGNOSIS — Z87442 Personal history of urinary calculi: Secondary | ICD-10-CM | POA: Diagnosis not present

## 2016-10-18 DIAGNOSIS — L2089 Other atopic dermatitis: Secondary | ICD-10-CM | POA: Diagnosis not present

## 2016-10-18 DIAGNOSIS — N200 Calculus of kidney: Secondary | ICD-10-CM | POA: Insufficient documentation

## 2016-12-13 DIAGNOSIS — L219 Seborrheic dermatitis, unspecified: Secondary | ICD-10-CM | POA: Diagnosis not present

## 2016-12-13 DIAGNOSIS — L2084 Intrinsic (allergic) eczema: Secondary | ICD-10-CM | POA: Diagnosis not present

## 2016-12-13 DIAGNOSIS — L209 Atopic dermatitis, unspecified: Secondary | ICD-10-CM | POA: Diagnosis not present

## 2016-12-13 DIAGNOSIS — L853 Xerosis cutis: Secondary | ICD-10-CM | POA: Diagnosis not present

## 2016-12-22 DIAGNOSIS — N200 Calculus of kidney: Secondary | ICD-10-CM | POA: Diagnosis not present

## 2017-01-03 DIAGNOSIS — N200 Calculus of kidney: Secondary | ICD-10-CM | POA: Diagnosis not present

## 2017-01-18 DIAGNOSIS — N200 Calculus of kidney: Secondary | ICD-10-CM | POA: Diagnosis not present

## 2017-02-03 DIAGNOSIS — Z09 Encounter for follow-up examination after completed treatment for conditions other than malignant neoplasm: Secondary | ICD-10-CM | POA: Diagnosis not present

## 2017-02-03 DIAGNOSIS — N200 Calculus of kidney: Secondary | ICD-10-CM | POA: Diagnosis not present

## 2017-02-15 DIAGNOSIS — L209 Atopic dermatitis, unspecified: Secondary | ICD-10-CM | POA: Diagnosis not present

## 2017-05-31 DIAGNOSIS — L2084 Intrinsic (allergic) eczema: Secondary | ICD-10-CM | POA: Diagnosis not present

## 2017-06-20 DIAGNOSIS — Z01818 Encounter for other preprocedural examination: Secondary | ICD-10-CM | POA: Diagnosis not present

## 2017-06-20 DIAGNOSIS — H25812 Combined forms of age-related cataract, left eye: Secondary | ICD-10-CM | POA: Diagnosis not present

## 2017-06-20 DIAGNOSIS — H2512 Age-related nuclear cataract, left eye: Secondary | ICD-10-CM | POA: Diagnosis not present

## 2017-06-20 DIAGNOSIS — H52202 Unspecified astigmatism, left eye: Secondary | ICD-10-CM | POA: Diagnosis not present

## 2017-06-20 DIAGNOSIS — H25813 Combined forms of age-related cataract, bilateral: Secondary | ICD-10-CM | POA: Diagnosis not present

## 2017-07-03 DIAGNOSIS — D508 Other iron deficiency anemias: Secondary | ICD-10-CM | POA: Diagnosis not present

## 2017-07-03 DIAGNOSIS — I10 Essential (primary) hypertension: Secondary | ICD-10-CM | POA: Diagnosis not present

## 2017-07-03 DIAGNOSIS — E785 Hyperlipidemia, unspecified: Secondary | ICD-10-CM | POA: Diagnosis not present

## 2017-07-03 DIAGNOSIS — Z23 Encounter for immunization: Secondary | ICD-10-CM | POA: Diagnosis not present

## 2017-07-03 DIAGNOSIS — E538 Deficiency of other specified B group vitamins: Secondary | ICD-10-CM | POA: Diagnosis not present

## 2017-07-03 DIAGNOSIS — Z0000 Encounter for general adult medical examination without abnormal findings: Secondary | ICD-10-CM | POA: Diagnosis not present

## 2017-07-11 DIAGNOSIS — H25811 Combined forms of age-related cataract, right eye: Secondary | ICD-10-CM | POA: Diagnosis not present

## 2017-07-11 DIAGNOSIS — H52201 Unspecified astigmatism, right eye: Secondary | ICD-10-CM | POA: Diagnosis not present

## 2017-07-11 DIAGNOSIS — H2511 Age-related nuclear cataract, right eye: Secondary | ICD-10-CM | POA: Diagnosis not present

## 2017-11-16 DIAGNOSIS — L2084 Intrinsic (allergic) eczema: Secondary | ICD-10-CM | POA: Diagnosis not present

## 2017-11-16 DIAGNOSIS — L853 Xerosis cutis: Secondary | ICD-10-CM | POA: Diagnosis not present

## 2017-11-17 DIAGNOSIS — L853 Xerosis cutis: Secondary | ICD-10-CM | POA: Insufficient documentation

## 2017-11-22 DIAGNOSIS — H35372 Puckering of macula, left eye: Secondary | ICD-10-CM | POA: Diagnosis not present

## 2018-01-03 DIAGNOSIS — M62838 Other muscle spasm: Secondary | ICD-10-CM | POA: Diagnosis not present

## 2018-06-01 DIAGNOSIS — H35372 Puckering of macula, left eye: Secondary | ICD-10-CM | POA: Diagnosis not present

## 2018-06-08 DIAGNOSIS — H26493 Other secondary cataract, bilateral: Secondary | ICD-10-CM | POA: Diagnosis not present

## 2018-07-17 DIAGNOSIS — Z23 Encounter for immunization: Secondary | ICD-10-CM | POA: Diagnosis not present

## 2018-07-17 DIAGNOSIS — D508 Other iron deficiency anemias: Secondary | ICD-10-CM | POA: Diagnosis not present

## 2018-07-17 DIAGNOSIS — I10 Essential (primary) hypertension: Secondary | ICD-10-CM | POA: Diagnosis not present

## 2018-07-17 DIAGNOSIS — E538 Deficiency of other specified B group vitamins: Secondary | ICD-10-CM | POA: Diagnosis not present

## 2018-07-17 DIAGNOSIS — Z0000 Encounter for general adult medical examination without abnormal findings: Secondary | ICD-10-CM | POA: Diagnosis not present

## 2018-07-17 DIAGNOSIS — E785 Hyperlipidemia, unspecified: Secondary | ICD-10-CM | POA: Diagnosis not present

## 2019-01-16 DIAGNOSIS — Z681 Body mass index (BMI) 19 or less, adult: Secondary | ICD-10-CM | POA: Diagnosis not present

## 2019-01-16 DIAGNOSIS — I10 Essential (primary) hypertension: Secondary | ICD-10-CM | POA: Insufficient documentation

## 2019-01-16 DIAGNOSIS — Z1231 Encounter for screening mammogram for malignant neoplasm of breast: Secondary | ICD-10-CM | POA: Diagnosis not present

## 2019-01-16 DIAGNOSIS — Z01419 Encounter for gynecological examination (general) (routine) without abnormal findings: Secondary | ICD-10-CM | POA: Diagnosis not present

## 2019-01-16 DIAGNOSIS — Z13820 Encounter for screening for osteoporosis: Secondary | ICD-10-CM | POA: Diagnosis not present

## 2019-04-09 DIAGNOSIS — M8580 Other specified disorders of bone density and structure, unspecified site: Secondary | ICD-10-CM | POA: Diagnosis not present

## 2019-05-08 DIAGNOSIS — J309 Allergic rhinitis, unspecified: Secondary | ICD-10-CM | POA: Diagnosis not present

## 2019-05-08 DIAGNOSIS — J45909 Unspecified asthma, uncomplicated: Secondary | ICD-10-CM | POA: Diagnosis not present

## 2019-06-19 DIAGNOSIS — S39012A Strain of muscle, fascia and tendon of lower back, initial encounter: Secondary | ICD-10-CM | POA: Diagnosis not present

## 2019-06-19 DIAGNOSIS — Z23 Encounter for immunization: Secondary | ICD-10-CM | POA: Diagnosis not present

## 2019-09-04 DIAGNOSIS — Z0000 Encounter for general adult medical examination without abnormal findings: Secondary | ICD-10-CM | POA: Diagnosis not present

## 2019-09-04 DIAGNOSIS — E785 Hyperlipidemia, unspecified: Secondary | ICD-10-CM | POA: Diagnosis not present

## 2019-09-04 DIAGNOSIS — I10 Essential (primary) hypertension: Secondary | ICD-10-CM | POA: Diagnosis not present

## 2019-09-04 DIAGNOSIS — E538 Deficiency of other specified B group vitamins: Secondary | ICD-10-CM | POA: Diagnosis not present

## 2019-09-13 DIAGNOSIS — E538 Deficiency of other specified B group vitamins: Secondary | ICD-10-CM | POA: Diagnosis not present

## 2019-09-20 DIAGNOSIS — E538 Deficiency of other specified B group vitamins: Secondary | ICD-10-CM | POA: Diagnosis not present

## 2019-09-27 DIAGNOSIS — E538 Deficiency of other specified B group vitamins: Secondary | ICD-10-CM | POA: Diagnosis not present

## 2019-10-04 DIAGNOSIS — E538 Deficiency of other specified B group vitamins: Secondary | ICD-10-CM | POA: Diagnosis not present

## 2019-10-28 DIAGNOSIS — Z03818 Encounter for observation for suspected exposure to other biological agents ruled out: Secondary | ICD-10-CM | POA: Diagnosis not present

## 2019-10-28 DIAGNOSIS — Z20828 Contact with and (suspected) exposure to other viral communicable diseases: Secondary | ICD-10-CM | POA: Diagnosis not present

## 2019-10-29 DIAGNOSIS — E538 Deficiency of other specified B group vitamins: Secondary | ICD-10-CM | POA: Diagnosis not present

## 2019-10-29 DIAGNOSIS — R0602 Shortness of breath: Secondary | ICD-10-CM | POA: Diagnosis not present

## 2019-10-29 DIAGNOSIS — M940 Chondrocostal junction syndrome [Tietze]: Secondary | ICD-10-CM | POA: Diagnosis not present

## 2019-11-05 DIAGNOSIS — M940 Chondrocostal junction syndrome [Tietze]: Secondary | ICD-10-CM | POA: Diagnosis not present

## 2019-11-05 DIAGNOSIS — E538 Deficiency of other specified B group vitamins: Secondary | ICD-10-CM | POA: Diagnosis not present

## 2019-11-05 DIAGNOSIS — J40 Bronchitis, not specified as acute or chronic: Secondary | ICD-10-CM | POA: Diagnosis not present

## 2019-11-28 DIAGNOSIS — E538 Deficiency of other specified B group vitamins: Secondary | ICD-10-CM | POA: Diagnosis not present

## 2019-12-20 DIAGNOSIS — E538 Deficiency of other specified B group vitamins: Secondary | ICD-10-CM | POA: Diagnosis not present

## 2019-12-26 DIAGNOSIS — E538 Deficiency of other specified B group vitamins: Secondary | ICD-10-CM | POA: Diagnosis not present

## 2020-02-12 DIAGNOSIS — M4854XA Collapsed vertebra, not elsewhere classified, thoracic region, initial encounter for fracture: Secondary | ICD-10-CM | POA: Diagnosis not present

## 2020-02-12 DIAGNOSIS — M546 Pain in thoracic spine: Secondary | ICD-10-CM | POA: Diagnosis not present

## 2020-02-12 DIAGNOSIS — M545 Low back pain, unspecified: Secondary | ICD-10-CM | POA: Insufficient documentation

## 2020-02-19 DIAGNOSIS — M546 Pain in thoracic spine: Secondary | ICD-10-CM | POA: Diagnosis not present

## 2020-02-21 DIAGNOSIS — S22060A Wedge compression fracture of T7-T8 vertebra, initial encounter for closed fracture: Secondary | ICD-10-CM | POA: Diagnosis not present

## 2020-02-21 DIAGNOSIS — M546 Pain in thoracic spine: Secondary | ICD-10-CM | POA: Diagnosis not present

## 2020-02-22 DIAGNOSIS — M546 Pain in thoracic spine: Secondary | ICD-10-CM | POA: Diagnosis not present

## 2020-02-28 ENCOUNTER — Other Ambulatory Visit (HOSPITAL_COMMUNITY): Payer: Self-pay | Admitting: Orthopedic Surgery

## 2020-02-28 DIAGNOSIS — M546 Pain in thoracic spine: Secondary | ICD-10-CM | POA: Diagnosis not present

## 2020-02-28 DIAGNOSIS — S22000A Wedge compression fracture of unspecified thoracic vertebra, initial encounter for closed fracture: Secondary | ICD-10-CM | POA: Diagnosis not present

## 2020-03-04 ENCOUNTER — Other Ambulatory Visit: Payer: Self-pay | Admitting: Oncology

## 2020-03-04 DIAGNOSIS — C9000 Multiple myeloma not having achieved remission: Secondary | ICD-10-CM

## 2020-03-05 ENCOUNTER — Other Ambulatory Visit: Payer: Self-pay

## 2020-03-05 ENCOUNTER — Telehealth: Payer: Self-pay | Admitting: Oncology

## 2020-03-05 ENCOUNTER — Inpatient Hospital Stay: Payer: BC Managed Care – PPO | Attending: Oncology

## 2020-03-05 DIAGNOSIS — M899 Disorder of bone, unspecified: Secondary | ICD-10-CM | POA: Diagnosis not present

## 2020-03-05 DIAGNOSIS — R634 Abnormal weight loss: Secondary | ICD-10-CM | POA: Insufficient documentation

## 2020-03-05 DIAGNOSIS — D509 Iron deficiency anemia, unspecified: Secondary | ICD-10-CM | POA: Insufficient documentation

## 2020-03-05 DIAGNOSIS — M546 Pain in thoracic spine: Secondary | ICD-10-CM | POA: Diagnosis not present

## 2020-03-05 DIAGNOSIS — M62838 Other muscle spasm: Secondary | ICD-10-CM | POA: Diagnosis not present

## 2020-03-05 DIAGNOSIS — D72819 Decreased white blood cell count, unspecified: Secondary | ICD-10-CM | POA: Diagnosis not present

## 2020-03-05 DIAGNOSIS — D539 Nutritional anemia, unspecified: Secondary | ICD-10-CM | POA: Insufficient documentation

## 2020-03-05 DIAGNOSIS — C9000 Multiple myeloma not having achieved remission: Secondary | ICD-10-CM

## 2020-03-05 LAB — COMPREHENSIVE METABOLIC PANEL
ALT: 15 U/L (ref 0–44)
AST: 16 U/L (ref 15–41)
Albumin: 4 g/dL (ref 3.5–5.0)
Alkaline Phosphatase: 87 U/L (ref 38–126)
Anion gap: 10 (ref 5–15)
BUN: 17 mg/dL (ref 6–20)
CO2: 29 mmol/L (ref 22–32)
Calcium: 9.9 mg/dL (ref 8.9–10.3)
Chloride: 102 mmol/L (ref 98–111)
Creatinine, Ser: 0.71 mg/dL (ref 0.44–1.00)
GFR calc Af Amer: >60 mL/min (ref >60)
GFR calc non Af Amer: >60 mL/min (ref >60)
Glucose, Bld: 90 mg/dL (ref 70–99)
Potassium: 4.1 mmol/L (ref 3.5–5.1)
Sodium: 141 mmol/L (ref 135–145)
Total Bilirubin: 0.3 mg/dL (ref 0.3–1.2)
Total Protein: 6.8 g/dL (ref 6.5–8.1)

## 2020-03-05 LAB — CBC WITH DIFFERENTIAL/PLATELET
Abs Immature Granulocytes: 0.01 10*3/uL (ref 0.00–0.07)
Basophils Absolute: 0 10*3/uL (ref 0.0–0.1)
Basophils Relative: 0 %
Eosinophils Absolute: 0.1 10*3/uL (ref 0.0–0.5)
Eosinophils Relative: 3 %
HCT: 24 % — ABNORMAL LOW (ref 36.0–46.0)
Hemoglobin: 8.2 g/dL — ABNORMAL LOW (ref 12.0–15.0)
Immature Granulocytes: 0 %
Lymphocytes Relative: 49 %
Lymphs Abs: 1.8 10*3/uL (ref 0.7–4.0)
MCH: 36.8 pg — ABNORMAL HIGH (ref 26.0–34.0)
MCHC: 34.2 g/dL (ref 30.0–36.0)
MCV: 107.6 fL — ABNORMAL HIGH (ref 80.0–100.0)
Monocytes Absolute: 0.4 10*3/uL (ref 0.1–1.0)
Monocytes Relative: 9 %
Neutro Abs: 1.5 10*3/uL — ABNORMAL LOW (ref 1.7–7.7)
Neutrophils Relative %: 39 %
Platelets: 158 10*3/uL (ref 150–400)
RBC: 2.23 MIL/uL — ABNORMAL LOW (ref 3.87–5.11)
RDW: 16.4 % — ABNORMAL HIGH (ref 11.5–15.5)
WBC: 3.7 10*3/uL — ABNORMAL LOW (ref 4.0–10.5)
nRBC: 0.5 % — ABNORMAL HIGH (ref 0.0–0.2)

## 2020-03-05 LAB — PROTEIN / CREATININE RATIO, URINE
Creatinine, Urine: 280.07 mg/dL
Protein Creatinine Ratio: 0.48 mg/mg{Cre} — ABNORMAL HIGH (ref 0.00–0.15)
Total Protein, Urine: 134 mg/dL

## 2020-03-05 LAB — SAVE SMEAR(SSMR), FOR PROVIDER SLIDE REVIEW

## 2020-03-05 NOTE — Telephone Encounter (Signed)
Received a new pt referral from Dr. Gladstone Lighter at [facility] for eval for multiple myeloma. Robin Wilson has been cld and scheduled to see Dr. Jana Hakim on 7/14 at 4pm w/labs on 7/8 at 4pm. Pt aware to arrive 15 minutes early.

## 2020-03-06 ENCOUNTER — Other Ambulatory Visit (HOSPITAL_COMMUNITY): Payer: Self-pay | Admitting: Orthopedic Surgery

## 2020-03-06 ENCOUNTER — Ambulatory Visit
Admission: RE | Admit: 2020-03-06 | Discharge: 2020-03-06 | Disposition: A | Discharge status: Home / Self Care | Payer: Self-pay | Source: Ambulatory Visit | Attending: Orthopedic Surgery | Admitting: Orthopedic Surgery

## 2020-03-06 DIAGNOSIS — R52 Pain, unspecified: Secondary | ICD-10-CM

## 2020-03-06 LAB — KAPPA/LAMBDA LIGHT CHAINS
Kappa free light chain: 919.4 mg/L — ABNORMAL HIGH (ref 3.3–19.4)
Kappa, lambda light chain ratio: 131.34 — ABNORMAL HIGH (ref 0.26–1.65)
Lambda free light chains: 7 mg/L (ref 5.7–26.3)

## 2020-03-09 LAB — MULTIPLE MYELOMA PANEL, SERUM
Albumin SerPl Elph-Mcnc: 3.9 g/dL (ref 2.9–4.4)
Albumin/Glob SerPl: 1.8 — ABNORMAL HIGH (ref 0.7–1.7)
Alpha 1: 0.2 g/dL (ref 0.0–0.4)
Alpha2 Glob SerPl Elph-Mcnc: 0.7 g/dL (ref 0.4–1.0)
B-Globulin SerPl Elph-Mcnc: 0.9 g/dL (ref 0.7–1.3)
Gamma Glob SerPl Elph-Mcnc: 0.4 g/dL (ref 0.4–1.8)
Globulin, Total: 2.2 g/dL (ref 2.2–3.9)
IgA: 13 mg/dL — ABNORMAL LOW (ref 87–352)
IgG (Immunoglobin G), Serum: 489 mg/dL — ABNORMAL LOW (ref 586–1602)
IgM (Immunoglobulin M), Srm: <5 mg/dL — ABNORMAL LOW (ref 26–217)
Total Protein ELP: 6.1 g/dL (ref 6.0–8.5)

## 2020-03-10 NOTE — Progress Notes (Signed)
[place]  [phone] Fax:(336) [phone]    ID: Robin Wilson DOB: April 22, 1961  MR#: [id number]  WGN#:[id number]  Patient Care Team: Angelina Pih, MD as PCP - General (Family Medicine) OTHER MD:   CHIEF COMPLAINT: Light chain myeloma  CURRENT TREATMENT: Work-up in progress   HISTORY OF CURRENT ILLNESS: Robin Wilson is a 59 y.o. woman that presented with low and thoracic back pain to Dr. Latanya Maudlin. She notes muscle spasms and a low grade fever of 100 degrees.   Labs completed on 02/19/2020 revealed a CBC that was within normal limits except for RBC 2.39, Hgb 8.8, HCT 26.3, MCV 110.0, MCH 36.8, RDW 17.7, AMC 91. CMP was within normal limits except for potassium 3.3, CO2 35. Uric Acid was within normal limits. Urine Protein was abnormal at 57. C-reactive protein was within normal limits. ANA screening was negative.   Thorasic Spine MRI on 02/22/2020 revealed diffuse marrow T1 hypointensity involving vertibral bodies and some spinous processes associated with mild STTR hyperintensity with mild wedge compression fracture of T7 and mild right superior endplate compression fracture of T11 that are suspicious for pathologic fractures. There is mild central zone narrowing at mid T7 with minimal-deformity of the cord. Differential considerationsinclude Lymphoma, multiple myeloma, and diffuse metastatic disease. There is partially visualized mild-moderate spinal canal stenosis and mild deformity of the cord at C5-6 and C6-7 probable moderate-severe to severe-neutral foraminal narrowing which would be better assessed dedicated MRI of the cervical spine as clinically warranted.   Lab Results  Component Value Date   TOTALPROTELP 6.1 03/05/2020    Lab Results  Component Value Date   KPAFRELGTCHN 919.4 (H) 03/05/2020   LAMBDASER 7.0 03/05/2020   KAPLAMBRATIO 131.34 (H) 03/05/2020   Of note she had a CT of the abdomen on 01/03/2017 at [facility] which showed  a well-defined sclerotic lesion in the left iliac bone consistent with a bone island and no lytic or sclerotic lesions anywhere else.  INTERVAL HISTORY: Robin Wilson was evaluated in the hematology clinic on 03/11/2020 accompanied by her husband Robin Wilson.   REVIEW OF SYSTEMS: [name] continues to have significant back pain.  She is using extra strength Tylenol and then all the time she uses 400 mg of ibuprofen.  At bedtime she takes her Percocet and that helps her sleep.  She denies constipation.  She tells me that she has lost about 8 pounds in the last 6 weeks.  She has a history of eczema which is not changed.  She denies drenching sweats but is significantly fatigued.  Her skin is dry.  She keeps a temperature between 99 and 100 most of the time.  She continues to work full-time, from home.  She is planning to go to the beach beginning July 18 and returning July 22.  A detailed review of systems today was otherwise noncontributory   PAST MEDICAL HISTORY: Past Medical History:  Diagnosis Date  . Hypertension   . Kidney stones   . Kidney stones      PAST SURGICAL HISTORY: Past Surgical History:  Procedure Laterality Date  . CESAREAN SECTION    . CYSTOSCOPY WITH RETROGRADE PYELOGRAM, URETEROSCOPY AND STENT PLACEMENT Right 09/16/2015   Procedure: CYSTOSCOPY WITH RETROGRADE PYELOGRAM, URETEROSCOPY AND STENT PLACEMENT WITH HOLMIUM LASER ABLATION ;  Surgeon: Alexis Frock, MD;  Location: [facility];  Service: Urology;  Laterality: Right;  . CYSTOSCOPY/RETROGRADE/URETEROSCOPY  06/04/2012   Procedure: CYSTOSCOPY/RETROGRADE/URETEROSCOPY;  Surgeon: Ailene Rud, MD;  Location: [facility];  Service: Urology;  Laterality: Left;  . UTERINE FIBROID EMBOLIZATION       FAMILY HISTORY: No family history on file. The patient has 1 brother, 2 sisters.  Both her parents died in their 1s.  There is no cancer history in the family to her knowledge   GYNECOLOGIC HISTORY:  No LMP recorded. Patient has had an  ablation. 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  Her 2 sons are from her first marriage, Robin Wilson who is a Administrator, arts at [employer] who graduated from [employer] in Merchandiser, retail.  In 2016 she married her second husband, Robin Wilson.  He is a Loss adjuster, chartered for [employer].  He has 3 children of his own living in [place] and [place].    The patient attends [employer] DIRECTIVES: In the absence of any documents to the contrary the patient's husband is her healthcare power of attorney   HEALTH MAINTENANCE: Social History   Tobacco Use  . Smoking status: Never Smoker  Substance Use Topics  . Alcohol use: No  . Drug use: No    Colonoscopy: Due  PAP: Up-to-date  Bone density: Osteopenia Mammography: Up-to-date  Allergies  Allergen Reactions  . Sulfa Drugs Cross Reactors     From childhood per pt's mother , pt is unaware of allergy status    Current Outpatient Medications  Medication Sig Dispense Refill  . albuterol (PROVENTIL HFA;VENTOLIN HFA) 108 (90 Base) MCG/ACT inhaler Inhale 1-2 puffs into the lungs every 6 (six) hours as needed for wheezing or shortness of breath.    . calcium carbonate (OS-CAL - DOSED IN MG OF ELEMENTAL CALCIUM) 1250 MG tablet Take 1 tablet by mouth daily.      . cyclobenzaprine (FLEXERIL) 10 MG tablet Take 10 mg by mouth 3 (three) times daily as needed for muscle spasms.   0  . hydrOXYzine (ATARAX/VISTARIL) 10 MG tablet Take 1 tablet (10 mg total) by mouth every 4 (four) hours as needed for anxiety. 30 tablet 0  . ketorolac (TORADOL) 10 MG tablet Take 1 tablet by mouth 3 (three) times daily as needed for moderate pain.   0  . losartan-hydrochlorothiazide (HYZAAR) 100-25 MG per tablet Take 1 tablet by mouth daily.      . Multiple Vitamins-Minerals (MULTIVITAMIN PO) Take 1 tablet by mouth daily.     . ondansetron (ZOFRAN) 4 MG tablet Take 1 tablet (4 mg total) by mouth every 6 (six) hours. 30 tablet 0  . oxyCODONE-acetaminophen (PERCOCET/ROXICET) 5-325 MG tablet Take 1-2 tablets by mouth every 6 (six) hours as needed for moderate pain or severe pain (post-operatively). 30 tablet 0  . senna-docusate (SENOKOT-S) 8.6-50 MG tablet Take 1 tablet by mouth 2 (two) times daily. While taking pain meds to prevent constipation 30 tablet 0  . sertraline (ZOLOFT) 50 MG tablet Take 75 mg by mouth daily.    . traZODone (DESYREL) 50 MG tablet Take 50 mg by mouth at bedtime.    . cetirizine (ZYRTEC) 10 MG tablet Take 10 mg by mouth daily.  (Patient not taking: Reported on 03/11/2020)    . promethazine (PHENERGAN) 25 MG tablet Take 1 tablet (25 mg total) by mouth every 6 (six) hours as needed for nausea. (Patient not taking: Reported on 09/13/2015) 12 tablet 0  No current facility-administered medications for this visit.     OBJECTIVE: White woman who appears stated age  26:   03/11/20 1607  BP: 130/79  Pulse: (!) 106  Resp: 16  Temp: 99.4 F (37.4 C)  SpO2: 100%   Wt Readings from Last 3 Encounters:  03/11/20 129 lb 12.8 oz (58.9 kg)  09/13/15 120 lb (54.4 kg)  05/24/13 141 lb 8 oz (64.2 kg)   Body mass index is 18.62 kg/m.    ECOG FS:1 - Symptomatic but completely ambulatory  Ocular: Sclerae unicteric, pupils round and equal Ear-nose-throat: Wearing a mask Lymphatic: No cervical or supraclavicular adenopathy, no axillary or inguinal adenopathy Lungs no rales or rhonchi Heart regular rate and rhythm Abd soft, nontender, positive bowel sounds MSK significant focal spinal tenderness mid back Neuro: non-focal, well-oriented, appropriate affect Breasts: Deferred   LAB RESULTS:  CMP     Component Value Date/Time   NA 141 03/05/2020 1558   K 4.1 03/05/2020 1558   CL 102 03/05/2020 1558   CO2 29  03/05/2020 1558   GLUCOSE 90 03/05/2020 1558   BUN 17 03/05/2020 1558   CREATININE 0.71 03/05/2020 1558   CALCIUM 9.9 03/05/2020 1558   PROT 6.8 03/05/2020 1558   ALBUMIN 4.0 03/05/2020 1558   AST 16 03/05/2020 1558   ALT 15 03/05/2020 1558   ALKPHOS 87 03/05/2020 1558   BILITOT 0.3 03/05/2020 1558   GFRNONAA >60 03/05/2020 1558   GFRAA >60 03/05/2020 1558    Lab Results  Component Value Date   TOTALPROTELP 6.1 03/05/2020     Lab Results  Component Value Date   KPAFRELGTCHN 919.4 (H) 03/05/2020   LAMBDASER 7.0 03/05/2020   KAPLAMBRATIO 131.34 (H) 03/05/2020    Lab Results  Component Value Date   WBC 3.7 (L) 03/05/2020   NEUTROABS 1.5 (L) 03/05/2020   HGB 8.2 (L) 03/05/2020   HCT 24.0 (L) 03/05/2020   MCV 107.6 (H) 03/05/2020   PLT 158 03/05/2020    No results found for: LABCA2  No components found for: NFAOZH086  No results for input(s): INR in the last 168 hours.  No results found for: LABCA2  No results found for: VHQ469  No results found for: GEX528  No results found for: UXL244  No results found for: CA2729  No components found for: HGQUANT  No results found for: CEA1 / No results found for: CEA1   No results found for: AFPTUMOR  No results found for: CHROMOGRNA  No results found for: PSA1  No visits with results within 3 Day(s) from this visit.  Latest known visit with results is:  Appointment on 03/05/2020  Component Date Value Ref Range Status  . Sodium 03/05/2020 141  135 - 145 mmol/L Final  . Potassium 03/05/2020 4.1  3.5 - 5.1 mmol/L Final  . Chloride 03/05/2020 102  98 - 111 mmol/L Final  . CO2 03/05/2020 29  22 - 32 mmol/L Final  . Glucose, Bld 03/05/2020 90  70 - 99 mg/dL Final   Glucose reference range applies only to samples taken after fasting for at least 8 hours.  . BUN 03/05/2020 17  6 - 20 mg/dL Final  . Creatinine, Ser 03/05/2020 0.71  0.44 - 1.00 mg/dL Final  . Calcium 03/05/2020 9.9  8.9 - 10.3 mg/dL Final  . Total  Protein 03/05/2020 6.8  6.5 - 8.1 g/dL Final  . Albumin 03/05/2020 4.0  3.5 - 5.0 g/dL Final  . AST 03/05/2020 16  15 - 41  U/L Final  . ALT 03/05/2020 15  0 - 44 U/L Final  . Alkaline Phosphatase 03/05/2020 87  38 - 126 U/L Final  . Total Bilirubin 03/05/2020 0.3  0.3 - 1.2 mg/dL Final  . GFR calc non Af Amer 03/05/2020 >60  >60 mL/min Final  . GFR calc Af Amer 03/05/2020 >60  >60 mL/min Final  . Anion gap 03/05/2020 10  5 - 15 Final   Performed at [facility], [place] [address] [place], [place] [address]  . Smear Review 03/05/2020 SMEAR STAINED AND AVAILABLE FOR REVIEW   Final   Performed at [facility], [address], [place], [place] [address]  . WBC 03/05/2020 3.7* 4.0 - 10.5 K/uL Final  . RBC 03/05/2020 2.23* 3.87 - 5.11 MIL/uL Final  . Hemoglobin 03/05/2020 8.2* 12.0 - 15.0 g/dL Final  . HCT 03/05/2020 24.0* 36 - 46 % Final  . MCV 03/05/2020 107.6* 80.0 - 100.0 fL Final  . MCH 03/05/2020 36.8* 26.0 - 34.0 pg Final  . MCHC 03/05/2020 34.2  30.0 - 36.0 g/dL Final  . RDW 03/05/2020 16.4* 11.5 - 15.5 % Final  . Platelets 03/05/2020 158  150 - 400 K/uL Final  . nRBC 03/05/2020 0.5* 0.0 - 0.2 % Final  . Neutrophils Relative % 03/05/2020 39  % Final  . Neutro Abs 03/05/2020 1.5* 1.7 - 7.7 K/uL Final  . Lymphocytes Relative 03/05/2020 49  % Final  . Lymphs Abs 03/05/2020 1.8  0.7 - 4.0 K/uL Final  . Monocytes Relative 03/05/2020 9  % Final  . Monocytes Absolute 03/05/2020 0.4  0 - 1 K/uL Final  . Eosinophils Relative 03/05/2020 3  % Final  . Eosinophils Absolute 03/05/2020 0.1  0 - 0 K/uL Final  . Basophils Relative 03/05/2020 0  % Final  . Basophils Absolute 03/05/2020 0.0  0 - 0 K/uL Final  . WBC Morphology 03/05/2020 VARIANT LYMPHS   Final  . Immature Granulocytes 03/05/2020 0  % Final  . Abs Immature Granulocytes 03/05/2020 0.01  0.00 - 0.07 K/uL Final  . Smudge Cells 03/05/2020 PRESENT   Final  . Polychromasia 03/05/2020  PRESENT   Final   Performed at [facility], [place] [address] [place], [place] [address]  . Creatinine, Urine 03/05/2020 280.07  mg/dL Final  . Total Protein, Urine 03/05/2020 134  mg/dL Final   NO NORMAL RANGE ESTABLISHED FOR THIS TEST  . Protein Creatinine Ratio 03/05/2020 0.48* 0.00 - 0.15 mg/mg[Cre] Final   Performed at [facility] [address] [place], [place] [address]  . Kappa free light chain 03/05/2020 919.4* 3.3 - 19.4 mg/L Final  . Lamda free light chains 03/05/2020 7.0  5.7 - 26.3 mg/L Final  . Kappa, lamda light chain ratio 03/05/2020 131.34* 0.26 - 1.65 Final   Comment: (NOTE) Performed At: [facility] [place], [place] [id number] Rush Farmer MD [phone]     (this displays the last labs from the last 3 days)  Lab Results  Component Value Date   TOTALPROTELP 6.1 03/05/2020   (this displays SPEP labs)  Lab Results  Component Value Date   KPAFRELGTCHN 919.4 (H) 03/05/2020   LAMBDASER 7.0 03/05/2020   KAPLAMBRATIO 131.34 (H) 03/05/2020   (kappa/lambda light chains)  No results found for: HGBA, HGBA2QUANT, HGBFQUANT, HGBSQUAN (Hemoglobinopathy evaluation)   No results found for: LDH  No results found for: IRON, TIBC, IRONPCTSAT (Iron and TIBC)  No results found  for: FERRITIN  Urinalysis    Component Value Date/Time   COLORURINE YELLOW 09/15/2015 2315   APPEARANCEUR CLOUDY (A) 09/15/2015 2315   LABSPEC 1.014 09/15/2015 2315   PHURINE 8.5 (H) 09/15/2015 2315   GLUCOSEU NEGATIVE 09/15/2015 2315   HGBUR SMALL (A) 09/15/2015 2315   BILIRUBINUR NEGATIVE 09/15/2015 2315   KETONESUR 15 (A) 09/15/2015 2315   PROTEINUR NEGATIVE 09/15/2015 2315   UROBILINOGEN 1.0 05/24/2013 1913   NITRITE NEGATIVE 09/15/2015 2315   LEUKOCYTESUR TRACE (A) 09/15/2015 2315     STUDIES:  No results found.   ELIGIBLE FOR AVAILABLE RESEARCH PROTOCOL: no   ASSESSMENT: 59 y.o. [place] woman presenting June 2021 with severe back pain, thoracic MRI 02/22/2020 suggestive of possible myeloma or lymphoma.  Work-up includes  (a) lab work 03/05/2020: Myeloma panel shows no M protein but all immunoglobulins are decreased.  The/lambda ratio is markedly abnormal at 131.34; CBC shows macrocytic anemia and leukopenia but review of the blood film is not diagnostic  (b) 24-hour urine pending  (c) bone survey pending  (d) bone marrow biopsy scheduled for 03/24/2020   PLAN: I spent approximately 60 minutes face to face with Keyandra with more than 50% of that time spent in counseling and coordination of care. Specifically we reviewed the biology of the patient's diagnosis and the specifics of her situation.  She understands that all blood cells are made in the marrow bones.  There are 3 of these cells, red cells, white cells and platelets.  2 of the cell lines in her case are abnormal, which is suggestive of a primary bone marrow problem.  The elevated MCV of the red cells is consistent with this as well.  She does not have an M protein which would be strongly suggestive of classical multiple myeloma but she has a very abnormal kappa/lambda ratio which is consistent with light chain myeloma.  These changes are small enough to be urinated so she needs a 24-hour urine which has been ordered.  She understands that myeloma can produce lytic lesions.  Occasionally can produce sclerotic lesions.  She had a sclerotic lesion in her left pelvis noted in 2018, very well-circumscribed and felt to be likely benign.  I have ordered a bone survey to look for additional lesions.  I offered her a bone marrow biopsy 03/16/2020 but they are going to the beach for a long planned vacation and they do not want to postpone or compromise that.  Her bone marrow biopsy accordingly will be on 03/24/2020 and they will return to see me a few days later to discuss results.  Once we have a diagnosis, if we are indeed  dealing with light chain myeloma, we will discuss treatment in detail but today they had a very general introduction to the treatment of myeloma which I think is going to be the diagnosis here.  As far as pain is concerned I suggested she take Tylenol 500 mg together with Motrin 400 mg 3 times a day with food.  She can take oxycodone 5 mg at bedtime as needed or at other times in the day if the Tylenol/Motrin combination is not sufficient.  She was counseled to regarding constipation, confusion and nausea from the oxycodone  She knows to call for any other issue that may develop before the next visit  Total encounter time 70 minutes.Ardean Simonich has a good understanding of the overall plan. She agrees with it. She knows the goal of treatment in her case  is cure. She will call with any problems that may develop before her next visit here.    Everrett Lacasse, Virgie Dad, MD  03/11/20 4:25 PM Medical Oncology and Hematology [facility] [place], [place] [address] Tel. [phone]    Fax. [phone]   I, Jacqualyn Posey am acting as a Education administrator for Chauncey Cruel, MD.   I, Lurline Del MD, have reviewed the above documentation for accuracy and completeness, and I agree with the above.     *Total Encounter Time as defined by the Centers for Medicare and Medicaid Services includes, in addition to the face-to-face time of a patient visit (documented in the note above) non-face-to-face time: obtaining and reviewing outside history, ordering and reviewing medications, tests or procedures, care coordination (communications with other health care professionals or caregivers) and documentation in the medical record.

## 2020-03-11 ENCOUNTER — Other Ambulatory Visit: Payer: Self-pay

## 2020-03-11 ENCOUNTER — Other Ambulatory Visit: Payer: Self-pay | Admitting: Oncology

## 2020-03-11 ENCOUNTER — Inpatient Hospital Stay (HOSPITAL_BASED_OUTPATIENT_CLINIC_OR_DEPARTMENT_OTHER): Payer: BC Managed Care – PPO | Admitting: Oncology

## 2020-03-11 VITALS — BP 130/79 | HR 106 | Temp 99.4°F | Resp 16 | Ht 70.0 in | Wt 129.8 lb

## 2020-03-11 DIAGNOSIS — R718 Other abnormality of red blood cells: Secondary | ICD-10-CM

## 2020-03-11 DIAGNOSIS — M62838 Other muscle spasm: Secondary | ICD-10-CM | POA: Diagnosis not present

## 2020-03-11 DIAGNOSIS — D72819 Decreased white blood cell count, unspecified: Secondary | ICD-10-CM | POA: Diagnosis not present

## 2020-03-11 DIAGNOSIS — M899 Disorder of bone, unspecified: Secondary | ICD-10-CM

## 2020-03-11 DIAGNOSIS — R509 Fever, unspecified: Secondary | ICD-10-CM

## 2020-03-11 DIAGNOSIS — R634 Abnormal weight loss: Secondary | ICD-10-CM | POA: Diagnosis not present

## 2020-03-11 DIAGNOSIS — M546 Pain in thoracic spine: Secondary | ICD-10-CM | POA: Diagnosis not present

## 2020-03-11 DIAGNOSIS — D539 Nutritional anemia, unspecified: Secondary | ICD-10-CM | POA: Diagnosis not present

## 2020-03-11 DIAGNOSIS — C9000 Multiple myeloma not having achieved remission: Secondary | ICD-10-CM

## 2020-03-11 DIAGNOSIS — D509 Iron deficiency anemia, unspecified: Secondary | ICD-10-CM | POA: Diagnosis not present

## 2020-03-11 MED ORDER — OXYCODONE HCL 5 MG PO TABS: 5.0000 mg | ORAL_TABLET | Freq: Four times a day (QID) | ORAL | 0 refills | Status: DC | PRN

## 2020-03-12 ENCOUNTER — Telehealth: Payer: Self-pay | Admitting: Oncology

## 2020-03-12 NOTE — Telephone Encounter (Signed)
Scheduled appts per 7/14 los. Pt confirmed appt date and times.

## 2020-03-13 ENCOUNTER — Other Ambulatory Visit: Payer: Self-pay

## 2020-03-13 ENCOUNTER — Ambulatory Visit (HOSPITAL_COMMUNITY)
Admission: RE | Admit: 2020-03-13 | Discharge: 2020-03-13 | Disposition: A | Discharge status: Home / Self Care | Payer: BC Managed Care – PPO | Source: Ambulatory Visit | Attending: Oncology | Admitting: Oncology

## 2020-03-13 ENCOUNTER — Inpatient Hospital Stay: Payer: BC Managed Care – PPO

## 2020-03-13 DIAGNOSIS — C9000 Multiple myeloma not having achieved remission: Secondary | ICD-10-CM

## 2020-03-22 NOTE — Progress Notes (Signed)
INDICATION: Multiple myeloma  Brief examination was performed. ENT: adequate airway clearance Heart: regular rate and rhythm.No Murmurs Lungs: clear to auscultation, no wheezes, normal respiratory effort  Bone Marrow Biopsy and Aspiration Procedure Note   Informed consent was obtained and potential risks including bleeding, infection and pain were reviewed with the patient.  The patient's name, date of birth, identification, consent and allergies were verified prior to the start of procedure and time out was performed.  The left posterior iliac crest was chosen as the site of biopsy.  The skin was prepped with ChloraPrep.   8 cc of 2% lidocaine was used to provide local anaesthesia.   Patient had episode of nausea prior to biopsy attempt that was relieved with 58m oral ativan and some ice water.  This resolved after receiving those.    8 cc of bone marrow aspirate was obtained followed by 1cm biopsy.  Pressure was applied to the biopsy site and bandage was placed over the biopsy site. Patient was made to lie on the back for 30 mins prior to discharge.  The procedure was tolerated well. COMPLICATIONS: None BLOOD LOSS: none The patient was discharged home in stable condition with a 1 week follow up to review results.  Patient was provided with post bone marrow biopsy instructions and instructed to call if there was any bleeding or worsening pain.  Specimens sent for flow cytometry, cytogenetics and additional studies.  Signed LScot Dock NP

## 2020-03-24 ENCOUNTER — Other Ambulatory Visit: Payer: Self-pay

## 2020-03-24 ENCOUNTER — Inpatient Hospital Stay: Payer: BC Managed Care – PPO

## 2020-03-24 ENCOUNTER — Inpatient Hospital Stay (HOSPITAL_BASED_OUTPATIENT_CLINIC_OR_DEPARTMENT_OTHER): Payer: BC Managed Care – PPO | Admitting: Adult Health

## 2020-03-24 ENCOUNTER — Telehealth: Payer: Self-pay

## 2020-03-24 ENCOUNTER — Other Ambulatory Visit: Payer: Self-pay | Admitting: Adult Health

## 2020-03-24 ENCOUNTER — Other Ambulatory Visit: Payer: Self-pay | Admitting: Oncology

## 2020-03-24 VITALS — BP 126/74 | HR 98 | Temp 98.2°F | Resp 18

## 2020-03-24 VITALS — BP 114/71 | HR 82 | Temp 98.2°F | Resp 17

## 2020-03-24 DIAGNOSIS — C9000 Multiple myeloma not having achieved remission: Secondary | ICD-10-CM

## 2020-03-24 DIAGNOSIS — E876 Hypokalemia: Secondary | ICD-10-CM

## 2020-03-24 DIAGNOSIS — M899 Disorder of bone, unspecified: Secondary | ICD-10-CM | POA: Diagnosis not present

## 2020-03-24 DIAGNOSIS — M546 Pain in thoracic spine: Secondary | ICD-10-CM | POA: Diagnosis not present

## 2020-03-24 DIAGNOSIS — R634 Abnormal weight loss: Secondary | ICD-10-CM | POA: Diagnosis not present

## 2020-03-24 DIAGNOSIS — D61818 Other pancytopenia: Secondary | ICD-10-CM | POA: Diagnosis not present

## 2020-03-24 DIAGNOSIS — D72819 Decreased white blood cell count, unspecified: Secondary | ICD-10-CM | POA: Diagnosis not present

## 2020-03-24 DIAGNOSIS — D539 Nutritional anemia, unspecified: Secondary | ICD-10-CM | POA: Diagnosis not present

## 2020-03-24 DIAGNOSIS — D509 Iron deficiency anemia, unspecified: Secondary | ICD-10-CM | POA: Diagnosis not present

## 2020-03-24 DIAGNOSIS — M62838 Other muscle spasm: Secondary | ICD-10-CM | POA: Diagnosis not present

## 2020-03-24 LAB — CBC WITH DIFFERENTIAL (CANCER CENTER ONLY)
Abs Immature Granulocytes: 0.01 10*3/uL (ref 0.00–0.07)
Basophils Absolute: 0 10*3/uL (ref 0.0–0.1)
Basophils Relative: 0 %
Eosinophils Absolute: 0.1 10*3/uL (ref 0.0–0.5)
Eosinophils Relative: 3 %
HCT: 20.4 % — ABNORMAL LOW (ref 36.0–46.0)
Hemoglobin: 6.9 g/dL — CL (ref 12.0–15.0)
Immature Granulocytes: 0 %
Lymphocytes Relative: 49 %
Lymphs Abs: 1.4 10*3/uL (ref 0.7–4.0)
MCH: 35.8 pg — ABNORMAL HIGH (ref 26.0–34.0)
MCHC: 33.8 g/dL (ref 30.0–36.0)
MCV: 105.7 fL — ABNORMAL HIGH (ref 80.0–100.0)
Monocytes Absolute: 0.3 10*3/uL (ref 0.1–1.0)
Monocytes Relative: 10 %
Neutro Abs: 1.1 10*3/uL — ABNORMAL LOW (ref 1.7–7.7)
Neutrophils Relative %: 38 %
Platelet Count: 133 10*3/uL — ABNORMAL LOW (ref 150–400)
RBC: 1.93 MIL/uL — ABNORMAL LOW (ref 3.87–5.11)
RDW: 16.5 % — ABNORMAL HIGH (ref 11.5–15.5)
WBC Count: 2.9 10*3/uL — ABNORMAL LOW (ref 4.0–10.5)
nRBC: 0 % (ref 0.0–0.2)

## 2020-03-24 LAB — COMPREHENSIVE METABOLIC PANEL
ALT: 11 U/L (ref 0–44)
AST: 13 U/L — ABNORMAL LOW (ref 15–41)
Albumin: 3.9 g/dL (ref 3.5–5.0)
Alkaline Phosphatase: 77 U/L (ref 38–126)
Anion gap: 8 (ref 5–15)
BUN: 18 mg/dL (ref 6–20)
CO2: 30 mmol/L (ref 22–32)
Calcium: 9.8 mg/dL (ref 8.9–10.3)
Chloride: 102 mmol/L (ref 98–111)
Creatinine, Ser: 0.71 mg/dL (ref 0.44–1.00)
GFR calc Af Amer: >60 mL/min (ref >60)
GFR calc non Af Amer: >60 mL/min (ref >60)
Glucose, Bld: 104 mg/dL — ABNORMAL HIGH (ref 70–99)
Potassium: 2.9 mmol/L — CL (ref 3.5–5.1)
Sodium: 140 mmol/L (ref 135–145)
Total Bilirubin: 0.4 mg/dL (ref 0.3–1.2)
Total Protein: 6.2 g/dL — ABNORMAL LOW (ref 6.5–8.1)

## 2020-03-24 LAB — PREPARE RBC (CROSSMATCH)

## 2020-03-24 MED ORDER — ACETAMINOPHEN 325 MG PO TABS
ORAL_TABLET | ORAL | Status: AC
  Filled 2020-03-24: qty 2

## 2020-03-24 MED ORDER — POTASSIUM CHLORIDE ER 10 MEQ PO TBCR: 20.0000 meq | EXTENDED_RELEASE_TABLET | Freq: Two times a day (BID) | ORAL | 0 refills | Status: DC

## 2020-03-24 MED ORDER — SODIUM CHLORIDE 0.9% IV SOLUTION
250.0000 mL | Freq: Once | INTRAVENOUS | Status: AC
  Administered 2020-03-24: 250 mL via INTRAVENOUS
  Filled 2020-03-24: qty 250

## 2020-03-24 MED ORDER — POTASSIUM CHLORIDE 10 MEQ/100ML IV SOLN
INTRAVENOUS | Status: AC
  Filled 2020-03-24: qty 200

## 2020-03-24 MED ORDER — LORAZEPAM 1 MG PO TABS
1.0000 mg | ORAL_TABLET | Freq: Once | ORAL | Status: AC
  Administered 2020-03-24: 1 mg via ORAL

## 2020-03-24 MED ORDER — DIPHENHYDRAMINE HCL 25 MG PO CAPS
25.0000 mg | ORAL_CAPSULE | Freq: Once | ORAL | Status: AC
  Administered 2020-03-24: 25 mg via ORAL

## 2020-03-24 MED ORDER — POTASSIUM CHLORIDE 10 MEQ/100ML IV SOLN
10.0000 meq | INTRAVENOUS | Status: AC
  Administered 2020-03-24 (×2): 10 meq via INTRAVENOUS

## 2020-03-24 MED ORDER — SODIUM CHLORIDE 0.9 % IV SOLN
Freq: Once | INTRAVENOUS | Status: AC
  Filled 2020-03-24: qty 250

## 2020-03-24 MED ORDER — POTASSIUM CHLORIDE 10 MEQ/100ML IV SOLN: 10.0000 meq | INTRAVENOUS | Status: DC

## 2020-03-24 MED ORDER — DIPHENHYDRAMINE HCL 25 MG PO CAPS
ORAL_CAPSULE | ORAL | Status: AC
  Filled 2020-03-24: qty 1

## 2020-03-24 MED ORDER — ACETAMINOPHEN 325 MG PO TABS
650.0000 mg | ORAL_TABLET | Freq: Once | ORAL | Status: AC
  Administered 2020-03-24: 650 mg via ORAL

## 2020-03-24 NOTE — Telephone Encounter (Signed)
Critical value: HGB 6.9 Wilber Bihari, NP notified

## 2020-03-24 NOTE — Telephone Encounter (Signed)
Called pt to review lab results. Per Wilber Bihari, NP, pt needs 1 unit PRBC and 20 mEq of K+. Pt states she can come back to get it today. Appt has been arranged for 12:00 arrival. Pt verbalized thanks and understanding.

## 2020-03-24 NOTE — Patient Instructions (Signed)
Blood Transfusion, Adult, Care After This sheet gives you information about how to care for yourself after your procedure. Your doctor may also give you more specific instructions. If you have problems or questions, contact your doctor. What can I expect after the procedure? After the procedure, it is common to have:  Bruising and soreness at the IV site.  A fever or chills on the day of the procedure. This may be your body's response to the new blood cells received.  A headache. Follow these instructions at home: Insertion site care      Follow instructions from your doctor about how to take care of your insertion site. This is where an IV tube was put into your vein. Make sure you: ? Wash your hands with soap and water before and after you change your bandage (dressing). If you cannot use soap and water, use hand sanitizer. ? Change your bandage as told by your doctor.  Check your insertion site every day for signs of infection. Check for: ? Redness, swelling, or pain. ? Bleeding from the site. ? Warmth. ? Pus or a bad smell. General instructions  Take over-the-counter and prescription medicines only as told by your doctor.  Rest as told by your doctor.  Go back to your normal activities as told by your doctor.  Keep all follow-up visits as told by your doctor. This is important. Contact a doctor if:  You have itching or red, swollen areas of skin (hives).  You feel worried or nervous (anxious).  You feel weak after doing your normal activities.  You have redness, swelling, warmth, or pain around the insertion site.  You have blood coming from the insertion site, and the blood does not stop with pressure.  You have pus or a bad smell coming from the insertion site. Get help right away if:  You have signs of a serious reaction. This may be coming from an allergy or the body's defense system (immune system). Signs include: ? Trouble breathing or shortness of  breath. ? Swelling of the face or feeling warm (flushed). ? Fever or chills. ? Head, chest, or back pain. ? Dark pee (urine) or blood in the pee. ? Widespread rash. ? Fast heartbeat. ? Feeling dizzy or light-headed. You may receive your blood transfusion in an outpatient setting. If so, you will be told whom to contact to report any reactions. These symptoms may be an emergency. Do not wait to see if the symptoms will go away. Get medical help right away. Call your local emergency services (911 in the [place] Do not drive yourself to the hospital. Summary  Bruising and soreness at the IV site are common.  Check your insertion site every day for signs of infection.  Rest as told by your doctor. Go back to your normal activities as told by your doctor.  Get help right away if you have signs of a serious reaction. This information is not intended to replace advice given to you by your health care provider. Make sure you discuss any questions you have with your health care provider. Document Revised: 02/07/2019 Document Reviewed: 02/07/2019 Elsevier Patient Education  [place].    Blood Transfusion, Adult A blood transfusion is a procedure in which you receive blood or a type of blood cell (blood component) through an IV. You may need a blood transfusion when your blood level is low. This may result from a bleeding disorder, illness, injury, or surgery. The blood may come from  a donor. You may also be able to donate blood for yourself (autologous blood donation) before a planned surgery. The blood given in a transfusion is made up of different blood components. You may receive:  Red blood cells. These carry oxygen to the cells in the body.  Platelets. These help your blood to clot.  Plasma. This is the liquid part of your blood. It carries proteins and other substances throughout the body.  White blood cells. These help you fight infections. If you have hemophilia or another  clotting disorder, you may also receive other types of blood products. Tell a health care provider about:  Any blood disorders you have.  Any previous reactions you have had during a blood transfusion.  Any allergies you have.  All medicines you are taking, including vitamins, herbs, eye drops, creams, and over-the-counter medicines.  Any surgeries you have had.  Any medical conditions you have, including any recent fever or cold symptoms.  Whether you are pregnant or may be pregnant. What are the risks? Generally, this is a safe procedure. However, problems may occur.  The most common problems include: ? A mild allergic reaction, such as red, swollen areas of skin (hives) and itching. ? Fever or chills. This may be the body's response to new blood cells received. This may occur during or up to 4 hours after the transfusion.  More serious problems may include: ? Transfusion-associated circulatory overload (TACO), or too much fluid in the lungs. This may cause breathing problems. ? A serious allergic reaction, such as difficulty breathing or swelling around the face and lips. ? Transfusion-related acute lung injury (TRALI), which causes breathing difficulty and low oxygen in the blood. This can occur within hours of the transfusion or several days later. ? Iron overload. This can happen after receiving many blood transfusions over a period of time. ? Infection or virus being transmitted. This is rare because donated blood is carefully tested before it is given. ? Hemolytic transfusion reaction. This is rare. It happens when your body's defense system (immune system)tries to attack the new blood cells. Symptoms may include fever, chills, nausea, low blood pressure, and low back or chest pain. ? Transfusion-associated graft-versus-host disease (TAGVHD). This is rare. It happens when donated cells attack your body's healthy tissues. What happens before the procedure? Medicines Ask your  health care provider about:  Changing or stopping your regular medicines. This is especially important if you are taking diabetes medicines or blood thinners.  Taking medicines such as aspirin and ibuprofen. These medicines can thin your blood. Do not take these medicines unless your health care provider tells you to take them.  Taking over-the-counter medicines, vitamins, herbs, and supplements. General instructions  Follow instructions from your health care provider about eating and drinking restrictions.  You will have a blood test to determine your blood type. This is necessary to know what kind of blood your body will accept and to match it to the donor blood.  If you are going to have a planned surgery, you may be able to do an autologous blood donation. This may be done in case you need to have a transfusion.  You will have your temperature, blood pressure, and pulse monitored before the transfusion.  If you have had an allergic reaction to a transfusion in the past, you may be given medicine to help prevent a reaction. This medicine may be given to you by mouth (orally) or through an IV.  Set aside time for the  blood transfusion. This procedure generally takes 1-4 hours to complete. What happens during the procedure?   An IV will be inserted into one of your veins.  The bag of donated blood will be attached to your IV. The blood will then enter through your vein.  Your temperature, blood pressure, and pulse will be monitored regularly during the transfusion. This monitoring is done to detect early signs of a transfusion reaction.  Tell your nurse right away if you have any of these symptoms during the transfusion: ? Shortness of breath or trouble breathing. ? Chest or back pain. ? Fever or chills. ? Hives or itching.  If you have any signs or symptoms of a reaction, your transfusion will be stopped and you may be given medicine.  When the transfusion is complete, your IV  will be removed.  Pressure may be applied to the IV site for a few minutes.  A bandage (dressing)will be applied. The procedure may vary among health care providers and hospitals. What happens after the procedure?  Your temperature, blood pressure, pulse, breathing rate, and blood oxygen level will be monitored until you leave the hospital or clinic.  Your blood may be tested to see how you are responding to the transfusion.  You may be warmed with fluids or blankets to maintain a normal body temperature.  If you receive your blood transfusion in an outpatient setting, you will be told whom to contact to report any reactions. Where to find more information For more information on blood transfusions, visit the [employer]: redcross.org Summary  A blood transfusion is a procedure in which you receive blood or a type of blood cell (blood component) through an IV.  The blood you receive may come from a donor or be donated by yourself (autologous blood donation) before a planned surgery.  The blood given in a transfusion is made up of different blood components. You may receive red blood cells, platelets, plasma, or white blood cells depending on the condition treated.  Your temperature, blood pressure, and pulse will be monitored before, during, and after the transfusion.  After the transfusion, your blood may be tested to see how your body has responded. This information is not intended to replace advice given to you by your health care provider. Make sure you discuss any questions you have with your health care provider. Document Revised: 02/07/2019 Document Reviewed: 02/07/2019 Elsevier Patient Education  [place].     Hypokalemia Hypokalemia means that the amount of potassium in the blood is lower than normal. Potassium is a chemical (electrolyte) that helps regulate the amount of fluid in the body. It also stimulates muscle tightening (contraction) and helps  nerves work properly. Normally, most of the body's potassium is inside cells, and only a very small amount is in the blood. Because the amount in the blood is so small, minor changes to potassium levels in the blood can be life-threatening. What are the causes? This condition may be caused by:  Antibiotic medicine.  Diarrhea or vomiting. Taking too much of a medicine that helps you have a bowel movement (laxative) can cause diarrhea and lead to hypokalemia.  Chronic kidney disease (CKD).  Medicines that help the body get rid of excess fluid (diuretics).  Eating disorders, such as bulimia.  Low magnesium levels in the body.  Sweating a lot. What are the signs or symptoms? Symptoms of this condition include:  Weakness.  Constipation.  Fatigue.  Muscle cramps.  Mental confusion.  Skipped heartbeats or irregular heartbeat (palpitations).  Tingling or numbness. How is this diagnosed? This condition is diagnosed with a blood test. How is this treated? This condition may be treated by:  Taking potassium supplements by mouth.  Adjusting the medicines that you take.  Eating more foods that contain a lot of potassium. If your potassium level is very low, you may need to get potassium through an IV and be monitored in the hospital. Follow these instructions at home:   Take over-the-counter and prescription medicines only as told by your health care provider. This includes vitamins and supplements.  Eat a healthy diet. A healthy diet includes fresh fruits and vegetables, whole grains, healthy fats, and lean proteins.  If instructed, eat more foods that contain a lot of potassium. This includes: ? Nuts, such as peanuts and pistachios. ? Seeds, such as sunflower seeds and pumpkin seeds. ? Peas, lentils, and lima beans. ? Whole grain and bran cereals and breads. ? Fresh fruits and vegetables, such as apricots, avocado, bananas, cantaloupe, kiwi, oranges, tomatoes, asparagus,  and potatoes. ? Orange juice. ? Tomato juice. ? Red meats. ? Yogurt.  Keep all follow-up visits as told by your health care provider. This is important. Contact a health care provider if you:  Have weakness that gets worse.  Feel your heart pounding or racing.  Vomit.  Have diarrhea.  Have diabetes (diabetes mellitus) and you have trouble keeping your blood sugar (glucose) in your target range. Get help right away if you:  Have chest pain.  Have shortness of breath.  Have vomiting or diarrhea that lasts for more than 2 days.  Faint. Summary  Hypokalemia means that the amount of potassium in the blood is lower than normal.  This condition is diagnosed with a blood test.  Hypokalemia may be treated by taking potassium supplements, adjusting the medicines that you take, or eating more foods that are high in potassium.  If your potassium level is very low, you may need to get potassium through an IV and be monitored in the hospital. This information is not intended to replace advice given to you by your health care provider. Make sure you discuss any questions you have with your health care provider. Document Revised: 03/28/2018 Document Reviewed: 03/28/2018 Elsevier Patient Education  [place].

## 2020-03-24 NOTE — Patient Instructions (Signed)
Bone Marrow Aspiration and Bone Marrow Biopsy, Adult, Care After This sheet gives you information about how to care for yourself after your procedure. Your health care provider may also give you more specific instructions. If you have problems or questions, contact your health care provider. What can I expect after the procedure? After the procedure, it is common to have:  Mild pain and tenderness.  Swelling.  Bruising. Follow these instructions at home: Puncture site care   Follow instructions from your health care provider about how to take care of the puncture site. Make sure you: ? Wash your hands with soap and water before and after you change your bandage (dressing). If soap and water are not available, use hand sanitizer. ? Change your dressing as told by your health care provider.  Check your puncture site every day for signs of infection. Check for: ? More redness, swelling, or pain. ? Fluid or blood. ? Warmth. ? Pus or a bad smell. Activity  Return to your normal activities as told by your health care provider. Ask your health care provider what activities are safe for you.  Do not lift anything that is heavier than 10 lb (4.5 kg), or the limit that you are told, until your health care provider says that it is safe.  Do not drive for 24 hours if you were given a sedative during your procedure. General instructions   Take over-the-counter and prescription medicines only as told by your health care provider.  Do not take baths, swim, or use a hot tub until your health care provider approves. Ask your health care provider if you may take showers. You may only be allowed to take sponge baths.  If directed, put ice on the affected area. To do this: ? Put ice in a plastic bag. ? Place a towel between your skin and the bag. ? Leave the ice on for 20 minutes, 2-3 times a day.  Keep all follow-up visits as told by your health care provider. This is important. Contact a  health care provider if:  Your pain is not controlled with medicine.  You have a fever.  You have more redness, swelling, or pain around the puncture site.  You have fluid or blood coming from the puncture site.  Your puncture site feels warm to the touch.  You have pus or a bad smell coming from the puncture site. Summary  After the procedure, it is common to have mild pain, tenderness, swelling, and bruising.  Follow instructions from your health care provider about how to take care of the puncture site and what activities are safe for you.  Take over-the-counter and prescription medicines only as told by your health care provider.  Contact a health care provider if you have any signs of infection, such as fluid or blood coming from the puncture site. This information is not intended to replace advice given to you by your health care provider. Make sure you discuss any questions you have with your health care provider. Document Revised: 01/01/2019 Document Reviewed: 01/01/2019 Elsevier Patient Education  2020 [employer] Inc.  

## 2020-03-24 NOTE — Progress Notes (Signed)
Pt received 1 unit PRBCs and 20 mEq of IV potassium today, tolerated well.  VSS.  Able to eat/drink and ambulate to restroom during tx w/out any issues.

## 2020-03-25 ENCOUNTER — Other Ambulatory Visit: Payer: Self-pay | Admitting: Oncology

## 2020-03-25 ENCOUNTER — Telehealth: Payer: Self-pay | Admitting: Adult Health

## 2020-03-25 LAB — BPAM RBC
Blood Product Expiration Date: 202108252359
ISSUE DATE / TIME: 202107271506
Unit Type and Rh: 9500

## 2020-03-25 LAB — TYPE AND SCREEN
ABO/RH(D): O NEG
Antibody Screen: NEGATIVE
Unit division: 0

## 2020-03-25 LAB — UPEP/UIFE/LIGHT CHAINS/TP, 24-HR UR
% BETA, Urine: 5.5 %
ALPHA 1 URINE: 0.5 %
Albumin, U: 2.5 %
Alpha 2, Urine: 2.1 %
Free Kappa Lt Chains,Ur: 9656.39 mg/L — ABNORMAL HIGH (ref 0.63–113.79)
Free Kappa/Lambda Ratio: 4449.95 — ABNORMAL HIGH (ref 1.03–31.76)
Free Lambda Lt Chains,Ur: 2.17 mg/L (ref 0.47–11.77)
GAMMA GLOBULIN URINE: 89.5 %
M-SPIKE %, Urine: 62.8 % — ABNORMAL HIGH
M-Spike, Mg/24 Hr: 239 mg/24 hr — ABNORMAL HIGH
Total Protein, Urine-Ur/day: 380 mg/24 hr — ABNORMAL HIGH (ref 30–150)
Total Protein, Urine: 95 mg/dL
Total Volume: 400

## 2020-03-25 LAB — KAPPA/LAMBDA LIGHT CHAINS, FREE, WITH RATIO, 24HR. URINE
FR KAPPA LT CH,24HR: 3678.61 mg/24 hr
FR LAMBDA LT CH,24HR: 0.88 mg/24 hr
Free Kappa Lt Chains,Ur: 9196.53 mg/L — ABNORMAL HIGH (ref 0.63–113.79)
Free Kappa/Lambda Ratio: 4180.24 — ABNORMAL HIGH (ref 1.03–31.76)
Free Lambda Lt Chains,Ur: 2.2 mg/L (ref 0.47–11.77)
Total Volume: 400

## 2020-03-25 NOTE — Progress Notes (Signed)
Late entry 03/24/2020 0915:   Procedure site checked prior to d/c.  Dressing clean, dry, and intact.  VSS.  Pt has no c/o or s/s of distress or discomfort.  Discharge instructions given.  Patient verbalized understanding.

## 2020-03-25 NOTE — Telephone Encounter (Signed)
No 7/27 los, no changes made to pt schedule

## 2020-03-26 ENCOUNTER — Other Ambulatory Visit: Payer: Self-pay | Admitting: Oncology

## 2020-03-26 DIAGNOSIS — C9000 Multiple myeloma not having achieved remission: Secondary | ICD-10-CM

## 2020-03-26 LAB — SURGICAL PATHOLOGY

## 2020-03-26 MED ORDER — ACYCLOVIR 400 MG PO TABS: 400.0000 mg | ORAL_TABLET | Freq: Two times a day (BID) | ORAL | 3 refills | Status: DC

## 2020-03-26 MED ORDER — PROCHLORPERAZINE MALEATE 10 MG PO TABS: 10.0000 mg | ORAL_TABLET | Freq: Four times a day (QID) | ORAL | 1 refills | Status: DC | PRN

## 2020-03-26 NOTE — Progress Notes (Signed)
START ON PATHWAY REGIMEN - Multiple Myeloma and Other Plasma Cell Dyscrasias     A cycle is every 21 days:     Bortezomib      Lenalidomide      Dexamethasone   **Always confirm dose/schedule in your pharmacy ordering system**  Patient Characteristics: Multiple Myeloma, Newly Diagnosed, Transplant Ineligible or Refused, Standard Risk Disease Classification: Multiple Myeloma R-ISS Staging: III Therapeutic Status: Newly Diagnosed Is Patient Eligible for Transplant<= Transplant Ineligible or Refused Risk Status: Standard Risk Intent of Therapy: Non-Curative / Palliative Intent, Discussed with Patient 

## 2020-03-26 NOTE — Progress Notes (Signed)
I called Robin Wilson and left her voice message that her studies do confirm myeloma.  She will see [place] 03/31/2020.  We can start bortezomib and dexamethasone at that date.   We will discuss transplant options and if she decides she wants to consider that she will be referred to a tertiary care center for evaluation and we will likely intensify her therapy as well.

## 2020-03-27 ENCOUNTER — Telehealth: Payer: Self-pay | Admitting: *Deleted

## 2020-03-27 NOTE — Telephone Encounter (Signed)
This RN spoke with pt per her call - returning MD message .  Treatment plan discussed - questions answered regarding side effects, benefits and how medication is administered ( Velcade and dexamethasone ).  Pt has appointment for 03/31/2020.  No further needs at this time.

## 2020-03-30 NOTE — Progress Notes (Addendum)
[place]  [phone] [phone]    ID: Robin Wilson DOB: 11-15-1960  MR#: [id number]  NTZ#:[id number]  Patient Care Team: Angelina Pih, MD as PCP - General (Family Medicine) Magrinat, Virgie Dad, MD as Consulting Physician (Oncology) Latanya Maudlin, MD as Consulting Physician (Orthopedic Surgery) Everlene Farrier, MD as Consulting Physician (Obstetrics and Gynecology) Domingo Pulse, MD (Urology) Ronald Lobo, MD as Consulting Physician (Gastroenterology) OTHER MD:   CHIEF COMPLAINT: Light chain myeloma  CURRENT TREATMENT: Bortezomib, dexamethasone  INTERVAL HISTORY: Robin Wilson was evaluated in the hematology clinic on 03/31/2020 accompanied by her husband Robin Wilson as well as the patient's 2 sisters.  She underwent bone marrow biopsy on 03/24/2020 that confirmed multiple myeloma, with the presence of 44% plasma cells ina  80-90% cellular marrow.  This was also confirmed by her UPEP and kappa lambda chains.  Her diagnostic work-up is summarized below.    She is due to start treatment today with Bortezomib and Dexamethasone.    REVIEW OF SYSTEMS: Robin Wilson continues to work full-time and one of her main questions is how much work is she going to be able to continue to do.  She can work virtually and generally does.  She is appropriately concerned regarding possible side effects toxicities and complications of her treatment which were discussed at length today.   HISTORY OF CURRENT ILLNESS: From the original intake note:  Robin Wilson is a 59 y.o. woman that presented with low and thoracic back pain to Dr. Latanya Maudlin. She notes muscle spasms and a low grade fever of 100 degrees.   Labs completed on 02/19/2020 revealed a CBC that was within normal limits except for RBC 2.39, Hgb 8.8, HCT 26.3, MCV 110.0, MCH 36.8, RDW 17.7, AMC 91. CMP was within normal limits except for potassium 3.3, CO2 35. Uric Acid was within normal limits. Urine Protein was  abnormal at 57. C-reactive protein was within normal limits. ANA screening was negative.   Thorasic Spine MRI on 02/22/2020 revealed diffuse marrow T1 hypointensity involving vertibral bodies and some spinous processes associated with mild STTR hyperintensity with mild wedge compression fracture of T7 and mild right superior endplate compression fracture of T11 that are suspicious for pathologic fractures. There is mild central zone narrowing at mid T7 with minimal-deformity of the cord. Differential considerationsinclude Lymphoma, multiple myeloma, and diffuse metastatic disease. There is partially visualized mild-moderate spinal canal stenosis and mild deformity of the cord at C5-6 and C6-7 probable moderate-severe to severe-neutral foraminal narrowing which would be better assessed dedicated MRI of the cervical spine as clinically warranted.   Lab Results  Component Value Date   TOTALPROTELP 6.1 03/05/2020    Lab Results  Component Value Date   KPAFRELGTCHN 919.4 (H) 03/05/2020   LAMBDASER 7.0 03/05/2020   KAPLAMBRATIO 131.34 (H) 03/05/2020   Of note she had a CT of the abdomen on 01/03/2017 at [facility] which showed a well-defined sclerotic lesion in the left iliac bone consistent with a bone island and no lytic or sclerotic lesions anywhere else.     PAST MEDICAL HISTORY: Past Medical History:  Diagnosis Date  . Hypertension   . Kidney stones   . Kidney stones      PAST SURGICAL HISTORY: Past Surgical History:  Procedure Laterality Date  . CESAREAN SECTION    . CYSTOSCOPY WITH RETROGRADE PYELOGRAM, URETEROSCOPY AND STENT PLACEMENT Right 09/16/2015   Procedure: CYSTOSCOPY WITH RETROGRADE PYELOGRAM, URETEROSCOPY AND STENT PLACEMENT WITH HOLMIUM LASER ABLATION ;  Surgeon: Hubbard Robinson  Tresa Moore, MD;  Location: [facility];  Service: Urology;  Laterality: Right;  . CYSTOSCOPY/RETROGRADE/URETEROSCOPY  06/04/2012   Procedure: CYSTOSCOPY/RETROGRADE/URETEROSCOPY;  Surgeon: Ailene Rud,  MD;  Location: [facility];  Service: Urology;  Laterality: Left;  . UTERINE FIBROID EMBOLIZATION       FAMILY HISTORY: No family history on file. The patient has 1 brother, 2 sisters.  Both her parents died in their 19s.  There is no cancer history in the family to her knowledge   GYNECOLOGIC HISTORY:  No LMP recorded. Patient has had an ablation. 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  Her 2 sons are from her first marriage, [name] who is a Administrator, arts at [employer] who graduated from [employer] in Merchandiser, retail.  In 2016 she married her second husband, Robin Wilson.  He is a Loss adjuster, chartered for [employer].  He has 3 children of his own living in [place] and [place].    The patient attends [employer] DIRECTIVES: In the absence of any documents to the contrary the patient's husband is her healthcare power of attorney   HEALTH MAINTENANCE: Social History   Tobacco Use  . Smoking status: Never Smoker  . Smokeless tobacco: Never Used  Substance Use Topics  . Alcohol use: No  . Drug use: No    Colonoscopy: Due  PAP: Up-to-date  Bone density: Osteopenia Mammography: Up-to-date  Allergies  Allergen Reactions  . Sulfa Drugs Cross Reactors     From childhood per pt's mother , pt is unaware of allergy status    Current Outpatient Medications  Medication Sig Dispense Refill  . acyclovir (ZOVIRAX) 400 MG tablet Take 1 tablet (400 mg total) by mouth 2 (two) times daily. 60 tablet 3  . albuterol (PROVENTIL HFA;VENTOLIN HFA) 108 (90 Base) MCG/ACT inhaler Inhale 1-2 puffs into the lungs every 6 (six) hours as needed for wheezing or shortness of breath.    . calcium carbonate (OS-CAL - DOSED IN MG OF ELEMENTAL CALCIUM) 1250 MG tablet Take 1  tablet by mouth daily.      . cetirizine (ZYRTEC) 10 MG tablet Take 10 mg by mouth daily.     . cyclobenzaprine (FLEXERIL) 10 MG tablet Take 10 mg by mouth 3 (three) times daily as needed for muscle spasms.   0  . hydrOXYzine (ATARAX/VISTARIL) 10 MG tablet Take 1 tablet (10 mg total) by mouth every 4 (four) hours as needed for anxiety. 30 tablet 0  . ketorolac (TORADOL) 10 MG tablet Take 1 tablet by mouth 3 (three) times daily as needed for moderate pain.   0  . losartan-hydrochlorothiazide (HYZAAR) 100-25 MG per tablet Take 1 tablet by mouth daily.     . Multiple Vitamins-Minerals (MULTIVITAMIN PO) Take 1 tablet by mouth daily.     . ondansetron (ZOFRAN) 4 MG tablet Take 1 tablet (4 mg total) by mouth every 6 (six) hours. 30 tablet 0  . oxyCODONE (OXY IR/ROXICODONE) 5 MG immediate release tablet Take 1-2 tablets (5-10 mg total) by mouth every 6 (six) hours as needed for severe pain. 60 tablet 0  . potassium chloride (KLOR-CON) 10 MEQ tablet Take 2 tablets (20 mEq total) by mouth 2 (two) times daily. 20 tablet 0  . prochlorperazine (COMPAZINE) 10 MG tablet  Take 1 tablet (10 mg total) by mouth every 6 (six) hours as needed (Nausea or vomiting). 30 tablet 1  . promethazine (PHENERGAN) 25 MG tablet Take 1 tablet (25 mg total) by mouth every 6 (six) hours as needed for nausea. 12 tablet 0  . senna-docusate (SENOKOT-S) 8.6-50 MG tablet Take 1 tablet by mouth 2 (two) times daily. While taking pain meds to prevent constipation 30 tablet 0  . sertraline (ZOLOFT) 50 MG tablet Take 75 mg by mouth daily.    . traZODone (DESYREL) 50 MG tablet Take 50 mg by mouth at bedtime.     No current facility-administered medications for this visit.     OBJECTIVE: White woman who appears younger than stated age  72:   03/31/20 0939  BP: 116/74  Pulse: 94  Resp: 17  Temp: 98.9 F (37.2 C)  SpO2: 100%   Wt Readings from Last 3 Encounters:  03/31/20 127 lb (57.6 kg)  03/11/20 129 lb 12.8 oz (58.9 kg)    09/13/15 120 lb (54.4 kg)   Body mass index is 18.22 kg/m.    ECOG FS:1 - Symptomatic but completely ambulatory  Sclerae unicteric, EOMs intact Wearing a mask No cervical or supraclavicular adenopathy Lungs no rales or rhonchi Heart regular rate and rhythm Abd soft, nontender, positive bowel sounds MSK no focal spinal tenderness, no upper extremity lymphedema Neuro: nonfocal, well oriented, appropriate affect Breasts: Deferred     LAB RESULTS:  CMP     Component Value Date/Time   NA 140 03/24/2020 0841   K 2.9 (LL) 03/24/2020 0841   CL 102 03/24/2020 0841   CO2 30 03/24/2020 0841   GLUCOSE 104 (H) 03/24/2020 0841   BUN 18 03/24/2020 0841   CREATININE 0.71 03/24/2020 0841   CALCIUM 9.8 03/24/2020 0841   PROT 6.2 (L) 03/24/2020 0841   ALBUMIN 3.9 03/24/2020 0841   AST 13 (L) 03/24/2020 0841   ALT 11 03/24/2020 0841   ALKPHOS 77 03/24/2020 0841   BILITOT 0.4 03/24/2020 0841   GFRNONAA >60 03/24/2020 0841   GFRAA >60 03/24/2020 0841    Lab Results  Component Value Date   TOTALPROTELP 6.1 03/05/2020     Lab Results  Component Value Date   KPAFRELGTCHN 919.4 (H) 03/05/2020   LAMBDASER 7.0 03/05/2020   KAPLAMBRATIO 4,449.95 (H) 03/24/2020   KAPLAMBRATIO 4,180.24 (H) 03/24/2020    Lab Results  Component Value Date   WBC 3.1 (L) 03/31/2020   NEUTROABS 1.3 (L) 03/31/2020   HGB 9.1 (L) 03/31/2020   HCT 27.0 (L) 03/31/2020   MCV 100.7 (H) 03/31/2020   PLT 136 (L) 03/31/2020    No results found for: LABCA2  No components found for: OFBPZW258  No results for input(s): INR in the last 168 hours.  No results found for: LABCA2  No results found for: NID782  No results found for: UMP536  No results found for: RWE315  No results found for: CA2729  No components found for: HGQUANT  No results found for: CEA1 / No results found for: CEA1   No results found for: AFPTUMOR  No results found for: CHROMOGRNA  No results found for: PSA1  Appointment  on 03/31/2020  Component Date Value Ref Range Status  . ABO/RH(D) 03/31/2020 PENDING   Incomplete  . Antibody Screen 03/31/2020 PENDING   Incomplete  . Sample Expiration 03/31/2020    Final                   Value:04/03/2020,2359 Performed  at [facility], [place] [address], [place], [place] [address]   . WBC Count 03/31/2020 3.1* 4.0 - 10.5 K/uL Final  . RBC 03/31/2020 2.68* 3.87 - 5.11 MIL/uL Final  . Hemoglobin 03/31/2020 9.1* 12.0 - 15.0 g/dL Final  . HCT 03/31/2020 27.0* 36 - 46 % Final  . MCV 03/31/2020 100.7* 80.0 - 100.0 fL Final  . MCH 03/31/2020 34.0  26.0 - 34.0 pg Final  . MCHC 03/31/2020 33.7  30.0 - 36.0 g/dL Final  . RDW 03/31/2020 18.6* 11.5 - 15.5 % Final  . Platelet Count 03/31/2020 136* 150 - 400 K/uL Final  . nRBC 03/31/2020 0.6* 0.0 - 0.2 % Final  . Neutrophils Relative % 03/31/2020 43  % Final  . Neutro Abs 03/31/2020 1.3* 1.7 - 7.7 K/uL Final  . Lymphocytes Relative 03/31/2020 52  % Final  . Lymphs Abs 03/31/2020 1.6  0.7 - 4.0 K/uL Final  . Monocytes Relative 03/31/2020 2  % Final  . Monocytes Absolute 03/31/2020 0.1  0 - 1 K/uL Final  . Eosinophils Relative 03/31/2020 3  % Final  . Eosinophils Absolute 03/31/2020 0.1  0 - 0 K/uL Final  . Basophils Relative 03/31/2020 0  % Final  . Basophils Absolute 03/31/2020 0.0  0 - 0 K/uL Final  . WBC Morphology 03/31/2020 PLASMACYTOID LYMPHS   Final  . Smear Review 03/31/2020 RARE NRBC   Final  . Abs Immature Granulocytes 03/31/2020 0.00  0.00 - 0.07 K/uL Final  . Ovalocytes 03/31/2020 PRESENT   Final   Performed at [facility], [place] [address], [place], [place] [address]    (this displays the last labs from the last 3 days)  Lab Results  Component Value Date   TOTALPROTELP 6.1 03/05/2020   (this displays SPEP labs)  Lab Results  Component Value Date   KPAFRELGTCHN 919.4 (H) 03/05/2020   LAMBDASER 7.0 03/05/2020   KAPLAMBRATIO 4,449.95 (H) 03/24/2020   KAPLAMBRATIO  4,180.24 (H) 03/24/2020   (kappa/lambda light chains)  No results found for: HGBA, HGBA2QUANT, HGBFQUANT, HGBSQUAN (Hemoglobinopathy evaluation)   No results found for: LDH  No results found for: IRON, TIBC, IRONPCTSAT (Iron and TIBC)  No results found for: FERRITIN  Urinalysis    Component Value Date/Time   COLORURINE YELLOW 09/15/2015 2315   APPEARANCEUR CLOUDY (A) 09/15/2015 2315   LABSPEC 1.014 09/15/2015 2315   PHURINE 8.5 (H) 09/15/2015 2315   GLUCOSEU NEGATIVE 09/15/2015 2315   HGBUR SMALL (A) 09/15/2015 2315   BILIRUBINUR NEGATIVE 09/15/2015 2315   KETONESUR 15 (A) 09/15/2015 2315   PROTEINUR NEGATIVE 09/15/2015 2315   UROBILINOGEN 1.0 05/24/2013 1913   NITRITE NEGATIVE 09/15/2015 2315   LEUKOCYTESUR TRACE (A) 09/15/2015 2315     STUDIES:  DG Bone Survey Met  Result Date: 03/16/2020 CLINICAL DATA:  Multiple myeloma. EXAM: METASTATIC BONE SURVEY COMPARISON:  September 13, 2015. FINDINGS: Focal lesion is noted within midshaft of left radius with multiple calcifications which may represent enchondroma multilevel degenerative disc disease is noted in the lower cervical spine. Probable old midthoracic compression fracture is noted. Several small rounded lucencies are noted throughout the skull which may simply represent venous lakes, but lytic lesions related to myeloma cannot be excluded. No other significant abnormalities are noted throughout the spine, rib cage, pelvis or extremities. IMPRESSION: Focal calcified lesion is noted within midshaft of left radius which may represent enchondroma. Several small rounded lucencies are noted throughout the skull which may simply represent venous lakes, but lytic lesions  related to myeloma cannot be excluded. Electronically Signed   By: Marijo Conception M.D.   On: 03/16/2020 12:38     ELIGIBLE FOR AVAILABLE RESEARCH PROTOCOL: no   ASSESSMENT: 59 y.o. [place] woman presenting June 2021 with severe back pain, thoracic  MRI 02/22/2020 suggestive of possible myeloma or lymphoma.  Work-up includes:  (a) 03/05/2020 Myeloma panel shows no M protein but all immunoglobulins are decreased.  The lambda ratio is markedly abnormal at 131.34; review of the blood film is not diagnostic  (b) 24-hour urine: markedly elevated Kappa light chains at 9,656; urine kappa/lambda ratio 4180.24, and urine M spike of 239.  (c) bone survey on 03/16/2020 shows rounded lucencies throughout the skull which could be venous lakes  (d) bone marrow biopsy on 7/27 confirms multiple myeloma with 44% plasma cells on the aspirate by manual differential, and 70 to 80% on the biopsy by CD138 immunohistochemistry  (e) cytogenetics and Myeloma FISH studies pending  (1) bortezomib/dexamethasone started 03/31/2020   PLAN: Robin Wilson met with Dr. Jana Hakim to discuss her diagnosis with multiple myeloma and her light chain disease in her urine.  She was recommended that she drink plenty of water, 3 quarts per day to prevent the kidneys from getting clogged with the light chains.    Dr. Jana Hakim reviewed that she will start treatment with a  Weekly injection called Bortezomib and pill called Dexamethasone.  She will do this initially.  He recommended that she get a second opinion at a tertiary medical center such as [facility], or [facility], to discuss the options of autologous transplant.  She plans on checking with her insurance and will let us know what facility to refer her to.    She was recommended to start taking Acyclovir BID.  We encouraged her to live as if she has a very decreased immune system and did not receive the COVID vaccine.    She has improved significantly since receiving the blood transfusion.  Her hemoglobin is 9.1 today, she does not require a transfusion.  Right now, she will continue working, and we will see how she feels over the next couple of weeks in order to determine whether or not she will need to take any time off.  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    Classie will return in 1 week for labs, f/u, and her next Bortezomeb.   She knows to call for any questions that may arise between now and her next appointment.  We are happy to see her sooner if needed.  Total encounter time: 45 minutes*    Wilber Bihari, NP 03/31/20 9:44 AM Medical Oncology and Hematology [facility] [place], [place] [address] Tel. [phone]    Fax. [phone]   ADDENDUM: I met with Robin Wilson and her family to go over her diagnosis of myeloma and have preliminary fashion.  The understands that this is not a curable disease with current information but it is a very treatable 1 in patients to live for many years.  Robin Wilson most likely will be a transplant candidate but I would like some input from what ever tertiary center she will eventually undergo autologous transportation, if she is to undergo autologous transplantation, before committing to more intensive treatment.  We are going to start on bortezomib and dexamethasone which I believe will provide her with an initial response and we  will intensify most likely with lenalidomide hopefully within the next 2 weeks.    She understands she is immunocompromised and should consider herself on vaccinated as far as the risk of COVID-19 is concerned  I have asked her to contact her insurance to see which of the [place] tertiary care centers is in her network.  We will then take it from there  She is already scheduled for return visit in 1 week to assess side effects and possibly intensified treatment at that point  I personally saw this patient and performed a substantive portion of this encounter with the listed APP documented above.   Chauncey Cruel, MD Medical Oncology and Hematology [facility] [address] [place], [place] [address] Tel. [phone]    Fax.  [phone]      *Total Encounter Time as defined by the Centers for Medicare and Medicaid Services includes, in addition to the face-to-face time of a patient visit (documented in the note above) non-face-to-face time: obtaining and reviewing outside history, ordering and reviewing medications, tests or procedures, care coordination (communications with other health care professionals or caregivers) and documentation in the medical record.

## 2020-03-31 ENCOUNTER — Inpatient Hospital Stay: Payer: BC Managed Care – PPO

## 2020-03-31 ENCOUNTER — Inpatient Hospital Stay: Payer: BC Managed Care – PPO | Attending: Oncology | Admitting: Adult Health

## 2020-03-31 ENCOUNTER — Other Ambulatory Visit: Payer: Self-pay

## 2020-03-31 ENCOUNTER — Telehealth: Payer: Self-pay

## 2020-03-31 ENCOUNTER — Telehealth: Payer: Self-pay | Admitting: Adult Health

## 2020-03-31 VITALS — BP 116/74 | HR 94 | Temp 98.9°F | Resp 17 | Ht 70.0 in | Wt 127.0 lb

## 2020-03-31 DIAGNOSIS — C9000 Multiple myeloma not having achieved remission: Secondary | ICD-10-CM

## 2020-03-31 DIAGNOSIS — D649 Anemia, unspecified: Secondary | ICD-10-CM | POA: Insufficient documentation

## 2020-03-31 DIAGNOSIS — Z5112 Encounter for antineoplastic immunotherapy: Secondary | ICD-10-CM | POA: Diagnosis not present

## 2020-03-31 LAB — CBC WITH DIFFERENTIAL (CANCER CENTER ONLY)
Abs Immature Granulocytes: 0 10*3/uL (ref 0.00–0.07)
Basophils Absolute: 0 10*3/uL (ref 0.0–0.1)
Basophils Relative: 0 %
Eosinophils Absolute: 0.1 10*3/uL (ref 0.0–0.5)
Eosinophils Relative: 3 %
HCT: 27 % — ABNORMAL LOW (ref 36.0–46.0)
Hemoglobin: 9.1 g/dL — ABNORMAL LOW (ref 12.0–15.0)
Lymphocytes Relative: 52 %
Lymphs Abs: 1.6 10*3/uL (ref 0.7–4.0)
MCH: 34 pg (ref 26.0–34.0)
MCHC: 33.7 g/dL (ref 30.0–36.0)
MCV: 100.7 fL — ABNORMAL HIGH (ref 80.0–100.0)
Monocytes Absolute: 0.1 10*3/uL (ref 0.1–1.0)
Monocytes Relative: 2 %
Neutro Abs: 1.3 10*3/uL — ABNORMAL LOW (ref 1.7–7.7)
Neutrophils Relative %: 43 %
Platelet Count: 136 10*3/uL — ABNORMAL LOW (ref 150–400)
RBC: 2.68 MIL/uL — ABNORMAL LOW (ref 3.87–5.11)
RDW: 18.6 % — ABNORMAL HIGH (ref 11.5–15.5)
WBC Count: 3.1 10*3/uL — ABNORMAL LOW (ref 4.0–10.5)
nRBC: 0.6 % — ABNORMAL HIGH (ref 0.0–0.2)

## 2020-03-31 LAB — COMPREHENSIVE METABOLIC PANEL
ALT: 11 U/L (ref 0–44)
AST: 14 U/L — ABNORMAL LOW (ref 15–41)
Albumin: 4.2 g/dL (ref 3.5–5.0)
Alkaline Phosphatase: 94 U/L (ref 38–126)
Anion gap: 10 (ref 5–15)
BUN: 16 mg/dL (ref 6–20)
CO2: 29 mmol/L (ref 22–32)
Calcium: 10.2 mg/dL (ref 8.9–10.3)
Chloride: 102 mmol/L (ref 98–111)
Creatinine, Ser: 0.75 mg/dL (ref 0.44–1.00)
GFR calc Af Amer: >60 mL/min (ref >60)
GFR calc non Af Amer: >60 mL/min (ref >60)
Glucose, Bld: 85 mg/dL (ref 70–99)
Potassium: 3.1 mmol/L — ABNORMAL LOW (ref 3.5–5.1)
Sodium: 141 mmol/L (ref 135–145)
Total Bilirubin: 0.5 mg/dL (ref 0.3–1.2)
Total Protein: 6.7 g/dL (ref 6.5–8.1)

## 2020-03-31 LAB — TYPE AND SCREEN
ABO/RH(D): O NEG
Antibody Screen: NEGATIVE

## 2020-03-31 MED ORDER — PROCHLORPERAZINE MALEATE 10 MG PO TABS
ORAL_TABLET | ORAL | Status: AC
  Filled 2020-03-31: qty 1

## 2020-03-31 MED ORDER — BORTEZOMIB CHEMO SQ INJECTION 3.5 MG (2.5MG/ML)
1.3000 mg/m2 | Freq: Once | INTRAMUSCULAR | Status: AC
  Administered 2020-03-31: 2.25 mg via SUBCUTANEOUS
  Filled 2020-03-31: qty 0.9

## 2020-03-31 MED ORDER — DEXAMETHASONE 4 MG PO TABS
40.0000 mg | ORAL_TABLET | Freq: Once | ORAL | Status: AC
  Administered 2020-03-31: 40 mg via ORAL

## 2020-03-31 MED ORDER — PROCHLORPERAZINE MALEATE 10 MG PO TABS
10.0000 mg | ORAL_TABLET | Freq: Once | ORAL | Status: AC
  Administered 2020-03-31: 10 mg via ORAL

## 2020-03-31 MED ORDER — DEXAMETHASONE 4 MG PO TABS
ORAL_TABLET | ORAL | Status: AC
  Filled 2020-03-31: qty 10

## 2020-03-31 NOTE — Telephone Encounter (Signed)
-----   Message from Gardenia Phlegm, NP sent at 03/31/2020  1:22 PM EDT ----- Patient potassium still slightly decreased.  Recommend she take 51meq daily.  Please call and see if she picked it up. ----- Message ----- From: [place]: 03/31/2020   9:10 AM EDT To: Chauncey Cruel, MD

## 2020-03-31 NOTE — Telephone Encounter (Signed)
Attempted to call pt per below message. No answer. LVM to return call.

## 2020-03-31 NOTE — Progress Notes (Signed)
Per Wilber Bihari, NP; OK to treat with ANC 1.3. Benjie Karvonen, RN aware.

## 2020-03-31 NOTE — Telephone Encounter (Signed)
Spoke with pt, informed of K+ level per Wilber Bihari, NP. Pt states she is going right now to pick up Rx for K+. Pt states she is awaiting call from scheduling about f/u. Advised pt to call us back if she has not received a call in one week. Pt verbalized thanks and understanding.

## 2020-03-31 NOTE — Patient Instructions (Addendum)
[facility] Discharge Instructions for Patients Receiving Chemotherapy  Today you received the following chemotherapy agents: Velcade.  To help prevent nausea and vomiting after your treatment, we encourage you to take your nausea medication as directed.   If you develop nausea and vomiting that is not controlled by your nausea medication, call the clinic.   BELOW ARE SYMPTOMS THAT SHOULD BE REPORTED IMMEDIATELY:  *FEVER GREATER THAN 100.5 F  *CHILLS WITH OR WITHOUT FEVER  NAUSEA AND VOMITING THAT IS NOT CONTROLLED WITH YOUR NAUSEA MEDICATION  *UNUSUAL SHORTNESS OF BREATH  *UNUSUAL BRUISING OR BLEEDING  TENDERNESS IN MOUTH AND THROAT WITH OR WITHOUT PRESENCE OF ULCERS  *URINARY PROBLEMS  *BOWEL PROBLEMS  UNUSUAL RASH Items with * indicate a potential emergency and should be followed up as soon as possible.  Feel free to call the clinic should you have any questions or concerns. The clinic phone number is [phone].  Please show the CHEMO ALERT CARD at check-in to the Emergency Department and triage nurse.  Bortezomib injection What is this medicine? BORTEZOMIB (bor TEZ oh mib) is a medicine that targets proteins in cancer cells and stops the cancer cells from growing. It is used to treat multiple myeloma and mantle-cell lymphoma. This medicine may be used for other purposes; ask your health care provider or pharmacist if you have questions. COMMON BRAND NAME(S): Velcade What should I tell my health care provider before I take this medicine? They need to know if you have any of these conditions:  diabetes  heart disease  irregular heartbeat  liver disease  on hemodialysis  low blood counts, like low white blood cells, platelets, or hemoglobin  peripheral neuropathy  taking medicine for blood pressure  an unusual or allergic reaction to bortezomib, mannitol, boron, other medicines, foods, dyes, or preservatives  pregnant or trying to get  pregnant  breast-feeding How should I use this medicine? This medicine is for injection into a vein or for injection under the skin. It is given by a health care professional in a hospital or clinic setting. Talk to your pediatrician regarding the use of this medicine in children. Special care may be needed. Overdosage: If you think you have taken too much of this medicine contact a poison control center or emergency room at once. NOTE: This medicine is only for you. Do not share this medicine with others. What if I miss a dose? It is important not to miss your dose. Call your doctor or health care professional if you are unable to keep an appointment. What may interact with this medicine? This medicine may interact with the following medications:  ketoconazole  rifampin  ritonavir  St. John's Wort This list may not describe all possible interactions. Give your health care provider a list of all the medicines, herbs, non-prescription drugs, or dietary supplements you use. Also tell them if you smoke, drink alcohol, or use illegal drugs. Some items may interact with your medicine. What should I watch for while using this medicine? You may get drowsy or dizzy. Do not drive, use machinery, or do anything that needs mental alertness until you know how this medicine affects you. Do not stand or sit up quickly, especially if you are an older patient. This reduces the risk of dizzy or fainting spells. In some cases, you may be given additional medicines to help with side effects. Follow all directions for their use. Call your doctor or health care professional for advice if you get a fever,   chills or sore throat, or other symptoms of a cold or flu. Do not treat yourself. This drug decreases your body's ability to fight infections. Try to avoid being around people who are sick. This medicine may increase your risk to bruise or bleed. Call your doctor or health care professional if you notice any  unusual bleeding. You may need blood work done while you are taking this medicine. In some patients, this medicine may cause a serious brain infection that may cause death. If you have any problems seeing, thinking, speaking, walking, or standing, tell your doctor right away. If you cannot reach your doctor, urgently seek other source of medical care. Check with your doctor or health care professional if you get an attack of severe diarrhea, nausea and vomiting, or if you sweat a lot. The loss of too much body fluid can make it dangerous for you to take this medicine. Do not become pregnant while taking this medicine or for at least 7 months after stopping it. Women should inform their doctor if they wish to become pregnant or think they might be pregnant. Men should not father a child while taking this medicine and for at least 4 months after stopping it. There is a potential for serious side effects to an unborn child. Talk to your health care professional or pharmacist for more information. Do not breast-feed an infant while taking this medicine or for 2 months after stopping it. This medicine may interfere with the ability to have a child. You should talk with your doctor or health care professional if you are concerned about your fertility. What side effects may I notice from receiving this medicine? Side effects that you should report to your doctor or health care professional as soon as possible:  allergic reactions like skin rash, itching or hives, swelling of the face, lips, or tongue  breathing problems  changes in hearing  changes in vision  fast, irregular heartbeat  feeling faint or lightheaded, falls  pain, tingling, numbness in the hands or feet  right upper belly pain  seizures  swelling of the ankles, feet, hands  unusual bleeding or bruising  unusually weak or tired  vomiting  yellowing of the eyes or skin Side effects that usually do not require medical  attention (report to your doctor or health care professional if they continue or are bothersome):  changes in emotions or moods  constipation  diarrhea  loss of appetite  headache  irritation at site where injected  nausea This list may not describe all possible side effects. Call your doctor for medical advice about side effects. You may report side effects to FDA at 1-800-FDA-1088. Where should I keep my medicine? This drug is given in a hospital or clinic and will not be stored at home. NOTE: This sheet is a summary. It may not cover all possible information. If you have questions about this medicine, talk to your doctor, pharmacist, or health care provider.  2020 [employer] Standard (2017-12-25 16:29:31)  

## 2020-03-31 NOTE — Telephone Encounter (Signed)
Scheduled appts per 8/3 los. Pt confirmed appt dates and times.  

## 2020-04-01 ENCOUNTER — Encounter: Payer: Self-pay | Admitting: Adult Health

## 2020-04-01 ENCOUNTER — Encounter (HOSPITAL_COMMUNITY): Payer: Self-pay | Admitting: Oncology

## 2020-04-06 ENCOUNTER — Encounter: Payer: Self-pay | Admitting: Adult Health

## 2020-04-06 ENCOUNTER — Encounter: Payer: Self-pay | Admitting: Oncology

## 2020-04-07 ENCOUNTER — Other Ambulatory Visit: Payer: Self-pay | Admitting: Oncology

## 2020-04-07 ENCOUNTER — Encounter: Payer: Self-pay | Admitting: Oncology

## 2020-04-07 DIAGNOSIS — C9000 Multiple myeloma not having achieved remission: Secondary | ICD-10-CM

## 2020-04-07 NOTE — Progress Notes (Unsigned)
Robin Wilson  [phone] [phone]    ID: Robin Wilson DOB: Jun 19, 1961  MR#: [id number]  OFB#:[id number]  Patient Care Team: Angelina Pih, MD as PCP - General (Family Medicine) Tyneka Scafidi, Virgie Dad, MD as Consulting Physician (Oncology) Latanya Maudlin, MD as Consulting Physician (Orthopedic Surgery) Everlene Farrier, MD as Consulting Physician (Obstetrics and Gynecology) Domingo Pulse, MD (Urology) Ronald Lobo, MD as Consulting Physician (Gastroenterology) OTHER MD:   CHIEF COMPLAINT: Light chain myeloma  CURRENT TREATMENT: Bortezomib, dexamethasone  INTERVAL HISTORY: Robin Wilson was evaluated in the hematology clinic on 03/31/2020 accompanied by her husband Robin Wilson as well as the patient's 2 sisters.  She underwent bone marrow biopsy on 03/24/2020 that confirmed multiple myeloma, with the presence of 44% plasma cells ina  80-90% cellular marrow.  This was also confirmed by her UPEP and kappa lambda chains.  Her diagnostic work-up is summarized below.    She is due to start treatment today with Bortezomib and Dexamethasone.    REVIEW OF SYSTEMS: Robin Wilson continues to work full-time and one of her main questions is how much work is she going to be able to continue to do.  She can work virtually and generally does.  She is appropriately concerned regarding possible side effects toxicities and complications of her treatment which were discussed at length today.   HISTORY OF CURRENT ILLNESS: From the original intake note:  Robin Wilson is a 59 y.o. woman that presented with low and thoracic back pain to Dr. Latanya Maudlin. She notes muscle spasms and a low grade fever of 100 degrees.   Labs completed on 02/19/2020 revealed a CBC that was within normal limits except for RBC 2.39, Hgb 8.8, HCT 26.3, MCV 110.0, MCH 36.8, RDW 17.7, AMC 91. CMP was within normal limits except for potassium 3.3, CO2 35. Uric Acid was within normal limits. Urine Protein was  abnormal at 57. C-reactive protein was within normal limits. ANA screening was negative.   Thorasic Spine MRI on 02/22/2020 revealed diffuse marrow T1 hypointensity involving vertibral bodies and some spinous processes associated with mild STTR hyperintensity with mild wedge compression fracture of T7 and mild right superior endplate compression fracture of T11 that are suspicious for pathologic fractures. There is mild central zone narrowing at mid T7 with minimal-deformity of the cord. Differential considerationsinclude Lymphoma, multiple myeloma, and diffuse metastatic disease. There is partially visualized mild-moderate spinal canal stenosis and mild deformity of the cord at C5-6 and C6-7 probable moderate-severe to severe-neutral foraminal narrowing which would be better assessed dedicated MRI of the cervical spine as clinically warranted.   Lab Results  Component Value Date   TOTALPROTELP 6.1 03/05/2020    Lab Results  Component Value Date   KPAFRELGTCHN 919.4 (H) 03/05/2020   LAMBDASER 7.0 03/05/2020   KAPLAMBRATIO 131.34 (H) 03/05/2020   Of note she had a CT of the abdomen on [facility]/03/2017 at [facility] which showed a well-defined sclerotic lesion in the left iliac bone consistent with a bone island and no lytic or sclerotic lesions anywhere else.     PAST MEDICAL HISTORY: Past Medical History:  Diagnosis Date  . Hypertension   . Kidney stones   . Kidney stones      PAST SURGICAL HISTORY: Past Surgical History:  Procedure Laterality Date  . CESAREAN SECTION    . CYSTOSCOPY WITH RETROGRADE PYELOGRAM, URETEROSCOPY AND STENT PLACEMENT Right 09/16/2015   Procedure: CYSTOSCOPY WITH RETROGRADE PYELOGRAM, URETEROSCOPY AND STENT PLACEMENT WITH HOLMIUM LASER ABLATION ;  Surgeon: Hubbard Robinson  Tresa Moore, MD;  Location: [facility];  Service: Urology;  Laterality: Right;  . CYSTOSCOPY/RETROGRADE/URETEROSCOPY  06/04/2012   Procedure: CYSTOSCOPY/RETROGRADE/URETEROSCOPY;  Surgeon: Ailene Rud,  MD;  Location: [facility];  Service: Urology;  Laterality: Left;  . UTERINE FIBROID EMBOLIZATION       FAMILY HISTORY: No family history on file. The patient has 1 brother, 2 sisters.  Both her parents died in their 4s.  There is no cancer history in the family to her knowledge   GYNECOLOGIC HISTORY:  No LMP recorded. Patient has had an ablation. 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  Her 2 sons are from her first marriage, [name] who is a Administrator, arts at [employer] who graduated from [employer] in Merchandiser, retail.  In 2016 she married her second husband, Robin Wilson.  He is a Loss adjuster, chartered for [employer].  He has 3 children of his own living in [place] and [place].    The patient attends [employer] DIRECTIVES: In the absence of any documents to the contrary the patient's husband is her healthcare power of attorney   HEALTH MAINTENANCE: Social History   Tobacco Use  . Smoking status: Never Smoker  . Smokeless tobacco: Never Used  Substance Use Topics  . Alcohol use: No  . Drug use: No    Colonoscopy: Due  PAP: Up-to-date  Bone density: Osteopenia Mammography: Up-to-date  Allergies  Allergen Reactions  . Sulfa Drugs Cross Reactors     From childhood per pt's mother , pt is unaware of allergy status    Current Outpatient Medications  Medication Sig Dispense Refill  . acyclovir (ZOVIRAX) 400 MG tablet Take 1 tablet (400 mg total) by mouth 2 (two) times daily. 60 tablet 3  . albuterol (PROVENTIL HFA;VENTOLIN HFA) 108 (90 Base) MCG/ACT inhaler Inhale 1-2 puffs into the lungs every 6 (six) hours as needed for wheezing or shortness of breath.    . calcium carbonate (OS-CAL - DOSED IN MG OF ELEMENTAL CALCIUM) 1250 MG tablet Take 1  tablet by mouth daily.      . cetirizine (ZYRTEC) 10 MG tablet Take 10 mg by mouth daily.     . cyclobenzaprine (FLEXERIL) 10 MG tablet Take 10 mg by mouth 3 (three) times daily as needed for muscle spasms.   0  . hydrOXYzine (ATARAX/VISTARIL) 10 MG tablet Take 1 tablet (10 mg total) by mouth every 4 (four) hours as needed for anxiety. 30 tablet 0  . ketorolac (TORADOL) 10 MG tablet Take 1 tablet by mouth 3 (three) times daily as needed for moderate pain.   0  . losartan-hydrochlorothiazide (HYZAAR) 100-25 MG per tablet Take 1 tablet by mouth daily.     . Multiple Vitamins-Minerals (MULTIVITAMIN PO) Take 1 tablet by mouth daily.     . ondansetron (ZOFRAN) 4 MG tablet Take 1 tablet (4 mg total) by mouth every 6 (six) hours. 30 tablet 0  . oxyCODONE (OXY IR/ROXICODONE) 5 MG immediate release tablet Take 1-2 tablets (5-10 mg total) by mouth every 6 (six) hours as needed for severe pain. 60 tablet 0  . potassium chloride (KLOR-CON) 10 MEQ tablet Take 2 tablets (20 mEq total) by mouth 2 (two) times daily. 20 tablet 0  . prochlorperazine (COMPAZINE) 10 MG tablet  Take 1 tablet (10 mg total) by mouth every 6 (six) hours as needed (Nausea or vomiting). 30 tablet 1  . promethazine (PHENERGAN) 25 MG tablet Take 1 tablet (25 mg total) by mouth every 6 (six) hours as needed for nausea. 12 tablet 0  . senna-docusate (SENOKOT-S) 8.6-50 MG tablet Take 1 tablet by mouth 2 (two) times daily. While taking pain meds to prevent constipation 30 tablet 0  . sertraline (ZOLOFT) 50 MG tablet Take 75 mg by mouth daily.    . traZODone (DESYREL) 50 MG tablet Take 50 mg by mouth at bedtime.     No current facility-administered medications for this visit.     OBJECTIVE: White woman who appears younger than stated age  There were no vitals filed for this visit. Wt Readings from Last 3 Encounters:  03/31/20 127 lb (57.6 kg)  03/11/20 129 lb 12.8 oz (58.9 kg)  09/13/15 120 lb (54.4 kg)   There is no height or weight on  file to calculate BMI.    ECOG FS:1 - Symptomatic but completely ambulatory  Sclerae unicteric, EOMs intact Wearing a mask No cervical or supraclavicular adenopathy Lungs no rales or rhonchi Heart regular rate and rhythm Abd soft, nontender, positive bowel sounds MSK no focal spinal tenderness, no upper extremity lymphedema Neuro: nonfocal, well oriented, appropriate affect Breasts: Deferred     LAB RESULTS:  CMP     Component Value Date/Time   NA 141 03/31/2020 0820   K 3.1 (L) 03/31/2020 0820   CL 102 03/31/2020 0820   CO2 29 03/31/2020 0820   GLUCOSE 85 03/31/2020 0820   BUN 16 03/31/2020 0820   CREATININE 0.75 03/31/2020 0820   CALCIUM 10.2 03/31/2020 0820   PROT 6.7 03/31/2020 0820   ALBUMIN 4.2 03/31/2020 0820   AST 14 (L) 03/31/2020 0820   ALT 11 03/31/2020 0820   ALKPHOS 94 03/31/2020 0820   BILITOT 0.5 03/31/2020 0820   GFRNONAA >60 03/31/2020 0820   GFRAA >60 03/31/2020 0820    Lab Results  Component Value Date   TOTALPROTELP 6.1 03/05/2020     Lab Results  Component Value Date   KPAFRELGTCHN 919.4 (H) 03/05/2020   LAMBDASER 7.0 03/05/2020   KAPLAMBRATIO 4,449.95 (H) 03/24/2020   KAPLAMBRATIO 4,180.24 (H) 03/24/2020    Lab Results  Component Value Date   WBC 3.1 (L) 03/31/2020   NEUTROABS 1.3 (L) 03/31/2020   HGB 9.1 (L) 03/31/2020   HCT 27.0 (L) 03/31/2020   MCV 100.7 (H) 03/31/2020   PLT 136 (L) 03/31/2020    No results found for: LABCA2  No components found for: IDPOEU235  No results for input(s): INR in the last 168 hours.  No results found for: LABCA2  No results found for: TIR443  No results found for: XVQ008  No results found for: QPY195  No results found for: CA2729  No components found for: HGQUANT  No results found for: CEA1 / No results found for: CEA1   No results found for: AFPTUMOR  No results found for: CHROMOGRNA  No results found for: PSA1  No visits with results within 3 Day(s) from this visit.    Latest known visit with results is:  Appointment on 03/31/2020  Component Date Value Ref Range Status  . ABO/RH(D) 03/31/2020 O NEG   Final  . Antibody Screen 03/31/2020 NEG   Final  . Sample Expiration 03/31/2020    Final  Value:04/03/2020,2359 Performed at [facility], [place] [address], [place], [place] [address]   . Sodium 03/31/2020 141  135 - 145 mmol/L Final  . Potassium 03/31/2020 3.1* 3.5 - 5.1 mmol/L Final  . Chloride 03/31/2020 102  98 - 111 mmol/L Final  . CO2 03/31/2020 29  22 - 32 mmol/L Final  . Glucose, Bld 03/31/2020 85  70 - 99 mg/dL Final   Glucose reference range applies only to samples taken after fasting for at least 8 hours.  . BUN 03/31/2020 16  6 - 20 mg/dL Final  . Creatinine, Ser 03/31/2020 0.75  0.44 - 1.00 mg/dL Final  . Calcium 03/31/2020 10.2  8.9 - 10.3 mg/dL Final  . Total Protein 03/31/2020 6.7  6.5 - 8.1 g/dL Final  . Albumin 03/31/2020 4.2  3.5 - 5.0 g/dL Final  . AST 03/31/2020 14* 15 - 41 U/L Final  . ALT 03/31/2020 11  0 - 44 U/L Final  . Alkaline Phosphatase 03/31/2020 94  38 - 126 U/L Final  . Total Bilirubin 03/31/2020 0.5  0.3 - 1.2 mg/dL Final  . GFR calc non Af Amer 03/31/2020 >60  >60 mL/min Final  . GFR calc Af Amer 03/31/2020 >60  >60 mL/min Final  . Anion gap 03/31/2020 10  5 - 15 Final   Performed at [facility], [place] [address] [place], [place] [address]  . WBC Count 03/31/2020 3.1* 4.0 - 10.5 K/uL Final  . RBC 03/31/2020 2.68* 3.87 - 5.11 MIL/uL Final  . Hemoglobin 03/31/2020 9.1* 12.0 - 15.0 g/dL Final  . HCT 03/31/2020 27.0* 36 - 46 % Final  . MCV 03/31/2020 100.7* 80.0 - 100.0 fL Final  . MCH 03/31/2020 34.0  26.0 - 34.0 pg Final  . MCHC 03/31/2020 33.7  30.0 - 36.0 g/dL Final  . RDW 03/31/2020 18.6* 11.5 - 15.5 % Final  . Platelet Count 03/31/2020 136* 150 - 400 K/uL Final  . nRBC 03/31/2020 0.6* 0.0 - 0.2 % Final  . Neutrophils Relative % 03/31/2020 43  % Final   . Neutro Abs 03/31/2020 1.3* 1.7 - 7.7 K/uL Final  . Lymphocytes Relative 03/31/2020 52  % Final  . Lymphs Abs 03/31/2020 1.6  0.7 - 4.0 K/uL Final  . Monocytes Relative 03/31/2020 2  % Final  . Monocytes Absolute 03/31/2020 0.1  0 - 1 K/uL Final  . Eosinophils Relative 03/31/2020 3  % Final  . Eosinophils Absolute 03/31/2020 0.1  0 - 0 K/uL Final  . Basophils Relative 03/31/2020 0  % Final  . Basophils Absolute 03/31/2020 0.0  0 - 0 K/uL Final  . WBC Morphology 03/31/2020 PLASMACYTOID LYMPHS   Final  . Smear Review 03/31/2020 RARE NRBC   Final  . Abs Immature Granulocytes 03/31/2020 0.00  0.00 - 0.07 K/uL Final  . Ovalocytes 03/31/2020 PRESENT   Final   Performed at [facility], [place] [address] [place], [place] [address]    (this displays the last labs from the last 3 days)  Lab Results  Component Value Date   TOTALPROTELP 6.1 03/05/2020   (this displays SPEP labs)  Lab Results  Component Value Date   KPAFRELGTCHN 919.4 (H) 03/05/2020   LAMBDASER 7.0 03/05/2020   KAPLAMBRATIO 4,449.95 (H) 03/24/2020   KAPLAMBRATIO 4,180.24 (H) 03/24/2020   (kappa/lambda light chains)  No results found for: HGBA, HGBA2QUANT, HGBFQUANT, HGBSQUAN (Hemoglobinopathy evaluation)   No results found for: LDH  No results found for: IRON, TIBC, IRONPCTSAT (  Iron and TIBC)  No results found for: FERRITIN  Urinalysis    Component Value Date/Time   COLORURINE YELLOW 09/15/2015 2315   APPEARANCEUR CLOUDY (A) 09/15/2015 2315   LABSPEC 1.014 09/15/2015 2315   PHURINE 8.5 (H) 09/15/2015 2315   GLUCOSEU NEGATIVE 09/15/2015 2315   HGBUR SMALL (A) 09/15/2015 2315   BILIRUBINUR NEGATIVE 09/15/2015 2315   KETONESUR 15 (A) 09/15/2015 2315   PROTEINUR NEGATIVE 09/15/2015 2315   UROBILINOGEN 1.0 05/24/2013 1913   NITRITE NEGATIVE 09/15/2015 2315   LEUKOCYTESUR TRACE (A) 09/15/2015 2315     STUDIES:  DG Bone Survey Met  Result Date: 03/16/2020 CLINICAL DATA:  Multiple  myeloma. EXAM: METASTATIC BONE SURVEY COMPARISON:  September 13, 2015. FINDINGS: Focal lesion is noted within midshaft of left radius with multiple calcifications which may represent enchondroma multilevel degenerative disc disease is noted in the lower cervical spine. Probable old midthoracic compression fracture is noted. Several small rounded lucencies are noted throughout the skull which may simply represent venous lakes, but lytic lesions related to myeloma cannot be excluded. No other significant abnormalities are noted throughout the spine, rib cage, pelvis or extremities. IMPRESSION: Focal calcified lesion is noted within midshaft of left radius which may represent enchondroma. Several small rounded lucencies are noted throughout the skull which may simply represent venous lakes, but lytic lesions related to myeloma cannot be excluded. Electronically Signed   By: Marijo Conception M.D.   On: 03/16/2020 12:38     ELIGIBLE FOR AVAILABLE RESEARCH PROTOCOL: no   ASSESSMENT: 60 y.o. Robin Wilson woman presenting June 2021 with severe back pain, thoracic MRI 02/22/2020 suggestive of possible myeloma or lymphoma.  Work-up includes:  (a) 03/05/2020 Myeloma panel shows no M protein but all immunoglobulins are decreased.  The lambda ratio is markedly abnormal at 131.34; review of the blood film is not diagnostic  (b) 24-hour urine: markedly elevated Kappa light chains at 9,656; urine kappa/lambda ratio 4180.24, and urine M spike of 239.  (c) bone survey on 03/16/2020 shows rounded lucencies throughout the skull which could be venous lakes  (d) bone marrow biopsy on 7/27 confirms multiple myeloma with 44% plasma cells on the aspirate by manual differential, and 70 to 80% on the biopsy by CD138 immunohistochemistry  (e) cytogenetics 03/24/2020: No metaphase cells available for analysis  (f) Myeloma FISH studies: Pending  (1) bortezomib/dexamethasone started 03/31/2020   PLAN: Robin Wilson met with Dr.  Jana Hakim to discuss her diagnosis with multiple myeloma and her light chain disease in her urine.  She was recommended that she drink plenty of water, 3 quarts per day to prevent the kidneys from getting clogged with the light chains.    Dr. Jana Hakim reviewed that she will start treatment with a  Weekly injection called Bortezomib and pill called Dexamethasone.  She will do this initially.  He recommended that she get a second opinion at a tertiary medical center such as [facility], or [facility], to discuss the options of autologous transplant.  She plans on checking with her insurance and will let us know what facility to refer her to.    She was recommended to start taking Acyclovir BID.  We encouraged her to live as if she has a very decreased immune system and did not receive the COVID vaccine.    She has improved significantly since receiving the blood transfusion.  Her hemoglobin is 9.1 today, she does not require a transfusion.  Right now, she will continue working, and we will see how  she feels over the next couple of weeks in order to determine whether or not she will need to take any time off.  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    Robin Wilson will return in 1 week for labs, f/u, and her next Bortezomeb.   She knows to call for any questions that may arise between now and her next appointment.  We are happy to see her sooner if needed.  Total encounter time: 45 minutes*    Wilber Bihari, NP 04/07/20 7:33 AM Medical Oncology and Hematology [name] [place], [place] [address] Tel. [phone]    Fax. [phone]   ADDENDUM: I met with Robin Wilson and her family to go over her diagnosis of myeloma and have preliminary fashion.  The understands that this is not a curable disease with current information but it is a very treatable 1 in patients to live for many years.  Robin Wilson most likely will be a  transplant candidate but I would like some input from what ever tertiary center she will eventually undergo autologous transportation, if she is to undergo autologous transplantation, before committing to more intensive treatment.  We are going to start on bortezomib and dexamethasone which I believe will provide her with an initial response and we will intensify most likely with lenalidomide hopefully within the next 2 weeks.    She understands she is immunocompromised and should consider herself on vaccinated as far as the risk of COVID-19 is concerned  I have asked her to contact her insurance to see which of the [place] tertiary care centers is in her network.  We will then take it from there  She is already scheduled for return visit in 1 week to assess side effects and possibly intensified treatment at that point  I personally saw this patient and performed a substantive portion of this encounter with the listed APP documented above.   Chauncey Cruel, MD Medical Oncology and Hematology [facility] [address] [place], [place] [address] Tel. [phone]    Fax. [phone]      *Total Encounter Time as defined by the Centers for Medicare and Medicaid Services includes, in addition to the face-to-face time of a patient visit (documented in the note above) non-face-to-face time: obtaining and reviewing outside history, ordering and reviewing medications, tests or procedures, care coordination (communications with other health care professionals or caregivers) and documentation in the medical record.

## 2020-04-08 ENCOUNTER — Inpatient Hospital Stay: Payer: BC Managed Care – PPO

## 2020-04-08 ENCOUNTER — Inpatient Hospital Stay (HOSPITAL_BASED_OUTPATIENT_CLINIC_OR_DEPARTMENT_OTHER): Payer: BC Managed Care – PPO | Admitting: Adult Health

## 2020-04-08 ENCOUNTER — Other Ambulatory Visit: Payer: Self-pay | Admitting: Oncology

## 2020-04-08 ENCOUNTER — Encounter: Payer: Self-pay | Admitting: Adult Health

## 2020-04-08 ENCOUNTER — Telehealth: Payer: Self-pay | Admitting: *Deleted

## 2020-04-08 ENCOUNTER — Other Ambulatory Visit: Payer: Self-pay

## 2020-04-08 VITALS — HR 95

## 2020-04-08 VITALS — BP 111/76 | HR 104 | Temp 98.0°F | Resp 16 | Ht 70.0 in | Wt 127.1 lb

## 2020-04-08 DIAGNOSIS — E876 Hypokalemia: Secondary | ICD-10-CM | POA: Diagnosis not present

## 2020-04-08 DIAGNOSIS — Z5112 Encounter for antineoplastic immunotherapy: Secondary | ICD-10-CM | POA: Diagnosis not present

## 2020-04-08 DIAGNOSIS — D649 Anemia, unspecified: Secondary | ICD-10-CM | POA: Diagnosis not present

## 2020-04-08 DIAGNOSIS — C9000 Multiple myeloma not having achieved remission: Secondary | ICD-10-CM

## 2020-04-08 LAB — COMPREHENSIVE METABOLIC PANEL
ALT: 11 U/L (ref 0–44)
AST: 12 U/L — ABNORMAL LOW (ref 15–41)
Albumin: 4.2 g/dL (ref 3.5–5.0)
Alkaline Phosphatase: 89 U/L (ref 38–126)
Anion gap: 12 (ref 5–15)
BUN: 14 mg/dL (ref 6–20)
CO2: 28 mmol/L (ref 22–32)
Calcium: 10.4 mg/dL — ABNORMAL HIGH (ref 8.9–10.3)
Chloride: 101 mmol/L (ref 98–111)
Creatinine, Ser: 0.75 mg/dL (ref 0.44–1.00)
GFR calc Af Amer: >60 mL/min (ref >60)
GFR calc non Af Amer: >60 mL/min (ref >60)
Glucose, Bld: 101 mg/dL — ABNORMAL HIGH (ref 70–99)
Potassium: 3.2 mmol/L — ABNORMAL LOW (ref 3.5–5.1)
Sodium: 141 mmol/L (ref 135–145)
Total Bilirubin: 0.5 mg/dL (ref 0.3–1.2)
Total Protein: 6.9 g/dL (ref 6.5–8.1)

## 2020-04-08 LAB — CBC WITH DIFFERENTIAL (CANCER CENTER ONLY)
Abs Immature Granulocytes: 0.03 10*3/uL (ref 0.00–0.07)
Basophils Absolute: 0 10*3/uL (ref 0.0–0.1)
Basophils Relative: 0 %
Eosinophils Absolute: 0.2 10*3/uL (ref 0.0–0.5)
Eosinophils Relative: 4 %
HCT: 26.3 % — ABNORMAL LOW (ref 36.0–46.0)
Hemoglobin: 8.9 g/dL — ABNORMAL LOW (ref 12.0–15.0)
Immature Granulocytes: 1 %
Lymphocytes Relative: 48 %
Lymphs Abs: 2.1 10*3/uL (ref 0.7–4.0)
MCH: 34.5 pg — ABNORMAL HIGH (ref 26.0–34.0)
MCHC: 33.8 g/dL (ref 30.0–36.0)
MCV: 101.9 fL — ABNORMAL HIGH (ref 80.0–100.0)
Monocytes Absolute: 0.3 10*3/uL (ref 0.1–1.0)
Monocytes Relative: 7 %
Neutro Abs: 1.7 10*3/uL (ref 1.7–7.7)
Neutrophils Relative %: 40 %
Platelet Count: 133 10*3/uL — ABNORMAL LOW (ref 150–400)
RBC: 2.58 MIL/uL — ABNORMAL LOW (ref 3.87–5.11)
RDW: 19.1 % — ABNORMAL HIGH (ref 11.5–15.5)
WBC Count: 4.2 10*3/uL (ref 4.0–10.5)
nRBC: 0.5 % — ABNORMAL HIGH (ref 0.0–0.2)

## 2020-04-08 LAB — LACTATE DEHYDROGENASE: LDH: 240 U/L — ABNORMAL HIGH (ref 98–192)

## 2020-04-08 MED ORDER — OXYCODONE HCL 5 MG PO TABS
5.0000 mg | ORAL_TABLET | Freq: Four times a day (QID) | ORAL | 0 refills | Status: DC | PRN
Start: 2020-04-08 — End: 2020-05-06

## 2020-04-08 MED ORDER — ASCRIPTIN 325 MG PO TABS
325.0000 mg | ORAL_TABLET | Freq: Every day | ORAL | 0 refills | Status: DC
Start: 2020-04-08 — End: 2020-04-08

## 2020-04-08 MED ORDER — DEXAMETHASONE 4 MG PO TABS
ORAL_TABLET | ORAL | Status: AC
  Filled 2020-04-08: qty 10

## 2020-04-08 MED ORDER — POTASSIUM CHLORIDE ER 10 MEQ PO TBCR: 20.0000 meq | EXTENDED_RELEASE_TABLET | Freq: Two times a day (BID) | ORAL | 0 refills | Status: DC

## 2020-04-08 MED ORDER — ASPIRIN EC 325 MG PO TBEC
325.0000 mg | DELAYED_RELEASE_TABLET | Freq: Every day | ORAL | 0 refills | Status: DC
Start: 2020-04-08 — End: 2020-06-17

## 2020-04-08 MED ORDER — DEXAMETHASONE 4 MG PO TABS
40.0000 mg | ORAL_TABLET | Freq: Once | ORAL | Status: AC
  Administered 2020-04-08: 40 mg via ORAL

## 2020-04-08 MED ORDER — LENALIDOMIDE 15 MG PO CAPS
15.0000 mg | ORAL_CAPSULE | Freq: Every day | ORAL | 6 refills | Status: DC
Start: 2020-04-08 — End: 2020-04-09

## 2020-04-08 MED ORDER — PROCHLORPERAZINE MALEATE 10 MG PO TABS: 10.0000 mg | ORAL_TABLET | Freq: Once | ORAL | Status: DC

## 2020-04-08 MED ORDER — BORTEZOMIB CHEMO SQ INJECTION 3.5 MG (2.5MG/ML)
1.3000 mg/m2 | Freq: Once | INTRAMUSCULAR | Status: AC
  Administered 2020-04-08: 2.25 mg via SUBCUTANEOUS
  Filled 2020-04-08: qty 0.9

## 2020-04-08 NOTE — Patient Instructions (Signed)
[facility] Discharge Instructions for Patients Receiving Chemotherapy  Today you received the following chemotherapy agents: Velcade.  To help prevent nausea and vomiting after your treatment, we encourage you to take your nausea medication as directed.   If you develop nausea and vomiting that is not controlled by your nausea medication, call the clinic.   BELOW ARE SYMPTOMS THAT SHOULD BE REPORTED IMMEDIATELY:  *FEVER GREATER THAN 100.5 F  *CHILLS WITH OR WITHOUT FEVER  NAUSEA AND VOMITING THAT IS NOT CONTROLLED WITH YOUR NAUSEA MEDICATION  *UNUSUAL SHORTNESS OF BREATH  *UNUSUAL BRUISING OR BLEEDING  TENDERNESS IN MOUTH AND THROAT WITH OR WITHOUT PRESENCE OF ULCERS  *URINARY PROBLEMS  *BOWEL PROBLEMS  UNUSUAL RASH Items with * indicate a potential emergency and should be followed up as soon as possible.  Feel free to call the clinic should you have any questions or concerns. The clinic phone number is [phone].  Please show the CHEMO ALERT CARD at check-in to the Emergency Department and triage nurse.  Bortezomib injection What is this medicine? BORTEZOMIB (bor TEZ oh mib) is a medicine that targets proteins in cancer cells and stops the cancer cells from growing. It is used to treat multiple myeloma and mantle-cell lymphoma. This medicine may be used for other purposes; ask your health care provider or pharmacist if you have questions. COMMON BRAND NAME(S): Velcade What should I tell my health care provider before I take this medicine? They need to know if you have any of these conditions:  diabetes  heart disease  irregular heartbeat  liver disease  on hemodialysis  low blood counts, like low white blood cells, platelets, or hemoglobin  peripheral neuropathy  taking medicine for blood pressure  an unusual or allergic reaction to bortezomib, mannitol, boron, other medicines, foods, dyes, or preservatives  pregnant or trying to get  pregnant  breast-feeding How should I use this medicine? This medicine is for injection into a vein or for injection under the skin. It is given by a health care professional in a hospital or clinic setting. Talk to your pediatrician regarding the use of this medicine in children. Special care may be needed. Overdosage: If you think you have taken too much of this medicine contact a poison control center or emergency room at once. NOTE: This medicine is only for you. Do not share this medicine with others. What if I miss a dose? It is important not to miss your dose. Call your doctor or health care professional if you are unable to keep an appointment. What may interact with this medicine? This medicine may interact with the following medications:  ketoconazole  rifampin  ritonavir  St. John's Wort This list may not describe all possible interactions. Give your health care provider a list of all the medicines, herbs, non-prescription drugs, or dietary supplements you use. Also tell them if you smoke, drink alcohol, or use illegal drugs. Some items may interact with your medicine. What should I watch for while using this medicine? You may get drowsy or dizzy. Do not drive, use machinery, or do anything that needs mental alertness until you know how this medicine affects you. Do not stand or sit up quickly, especially if you are an older patient. This reduces the risk of dizzy or fainting spells. In some cases, you may be given additional medicines to help with side effects. Follow all directions for their use. Call your doctor or health care professional for advice if you get a fever,   chills or sore throat, or other symptoms of a cold or flu. Do not treat yourself. This drug decreases your body's ability to fight infections. Try to avoid being around people who are sick. This medicine may increase your risk to bruise or bleed. Call your doctor or health care professional if you notice any  unusual bleeding. You may need blood work done while you are taking this medicine. In some patients, this medicine may cause a serious brain infection that may cause death. If you have any problems seeing, thinking, speaking, walking, or standing, tell your doctor right away. If you cannot reach your doctor, urgently seek other source of medical care. Check with your doctor or health care professional if you get an attack of severe diarrhea, nausea and vomiting, or if you sweat a lot. The loss of too much body fluid can make it dangerous for you to take this medicine. Do not become pregnant while taking this medicine or for at least 7 months after stopping it. Women should inform their doctor if they wish to become pregnant or think they might be pregnant. Men should not father a child while taking this medicine and for at least 4 months after stopping it. There is a potential for serious side effects to an unborn child. Talk to your health care professional or pharmacist for more information. Do not breast-feed an infant while taking this medicine or for 2 months after stopping it. This medicine may interfere with the ability to have a child. You should talk with your doctor or health care professional if you are concerned about your fertility. What side effects may I notice from receiving this medicine? Side effects that you should report to your doctor or health care professional as soon as possible:  allergic reactions like skin rash, itching or hives, swelling of the face, lips, or tongue  breathing problems  changes in hearing  changes in vision  fast, irregular heartbeat  feeling faint or lightheaded, falls  pain, tingling, numbness in the hands or feet  right upper belly pain  seizures  swelling of the ankles, feet, hands  unusual bleeding or bruising  unusually weak or tired  vomiting  yellowing of the eyes or skin Side effects that usually do not require medical  attention (report to your doctor or health care professional if they continue or are bothersome):  changes in emotions or moods  constipation  diarrhea  loss of appetite  headache  irritation at site where injected  nausea This list may not describe all possible side effects. Call your doctor for medical advice about side effects. You may report side effects to FDA at 1-800-FDA-1088. Where should I keep my medicine? This drug is given in a hospital or clinic and will not be stored at home. NOTE: This sheet is a summary. It may not cover all possible information. If you have questions about this medicine, talk to your doctor, pharmacist, or health care provider.  2020 [employer] Standard (2017-12-25 16:29:31)  

## 2020-04-08 NOTE — Progress Notes (Addendum)
[place]  [phone] [phone]    ID: Robin Wilson DOB: 1960/11/16  MR#: [id number]  IRC#:[id number]  Patient Care Team: Angelina Pih, MD as PCP - General (Family Medicine) Magrinat, Virgie Dad, MD as Consulting Physician (Oncology) Latanya Maudlin, MD as Consulting Physician (Orthopedic Surgery) Everlene Farrier, MD as Consulting Physician (Obstetrics and Gynecology) Domingo Pulse, MD (Urology) Ronald Lobo, MD as Consulting Physician (Gastroenterology) OTHER MD:   CHIEF COMPLAINT: Light chain myeloma  CURRENT TREATMENT: Bortezomib, dexamethasone  INTERVAL HISTORY: Robin Wilson was evaluated in the hematology clinic on 03/31/2020 accompanied by her husband Robin Wilson as well as the patient's 2 sisters.  She underwent bone marrow biopsy on 03/24/2020 that confirmed multiple myeloma, with the presence of 44% plasma cells ina  80-90% cellular marrow.  This was also confirmed by her UPEP and kappa lambda chains.  Her diagnostic work-up is summarized below.    She started treatment with Bortezomib and Dexamethasone weekly, beginning last week, 03/31/2020.  She is due for another dose today.   REVIEW OF SYSTEMS: Robin Wilson noted an improvement in her back pain with the dexamethasone and two days after.  She has been taking 476m Ibuprofen and tylenol BID for her back pain.  She is taking oxycodone as needed for her back pain if the ibuprofen and tylenol doesn't help.  She takes the oxycodone at night.    JConsepcionhas had no constipation, nausea, or vomiting. She notes her activity level is improved.  She is walking more.  She is tolerating the Acyclovir BID well, along with the potassium tablets.  She notes her weight today is 127, same as last week.  She is drinking protein shakes if she doesn't eat.   [facility] evaluation by her insurance.  She also had a question about her staging.  Beta 2 microglobulin  is pending.    JLauramaeis otherwise doing well and a detailed ROS was otherwise non contributory.     HISTORY OF CURRENT ILLNESS: From the original intake note:  JSANAH KRASKAis a 59y.o. woman that presented with low and thoracic back pain to Dr. RLatanya Maudlin She notes muscle spasms and a low grade fever of 100 degrees.   Labs completed on 02/19/2020 revealed a CBC that was within normal limits except for RBC 2.39, Hgb 8.8, HCT 26.3, MCV 110.0, MCH 36.8, RDW 17.7, AMC 91. CMP was within normal limits except for potassium 3.3, CO2 35. Uric Acid was within normal limits. Urine Protein was abnormal at 57. C-reactive protein was within normal limits. ANA screening was negative.   Thorasic Spine MRI on 02/22/2020 revealed diffuse marrow T1 hypointensity involving vertibral bodies and some spinous processes associated with mild STTR hyperintensity with mild wedge compression fracture of T7 and mild right superior endplate compression fracture of T11 that are suspicious for pathologic fractures. There is mild central zone narrowing at mid T7 with minimal-deformity of the cord. Differential considerationsinclude Lymphoma, multiple myeloma, and diffuse metastatic disease. There is partially visualized mild-moderate spinal canal stenosis and mild deformity of the cord at C5-6 and C6-7 probable moderate-severe to severe-neutral foraminal narrowing which would be better assessed dedicated MRI of the cervical spine as clinically warranted.   Lab Results  Component Value Date   TOTALPROTELP 6.1 03/05/2020    Lab Results  Component Value Date   KPAFRELGTCHN 919.4 (H) 03/05/2020   LAMBDASER 7.0 03/05/2020   KAPLAMBRATIO 131.34 (H)  03/05/2020   Of note she had a CT of the abdomen on [facility]/03/2017 at [facility] which showed a well-defined sclerotic lesion in the left iliac bone consistent with a bone island and no lytic or sclerotic lesions anywhere else.     PAST MEDICAL HISTORY: Past Medical History:    Diagnosis Date  . Hypertension   . Kidney stones   . Kidney stones      PAST SURGICAL HISTORY: Past Surgical History:  Procedure Laterality Date  . CESAREAN SECTION    . CYSTOSCOPY WITH RETROGRADE PYELOGRAM, URETEROSCOPY AND STENT PLACEMENT Right 09/16/2015   Procedure: CYSTOSCOPY WITH RETROGRADE PYELOGRAM, URETEROSCOPY AND STENT PLACEMENT WITH HOLMIUM LASER ABLATION ;  Surgeon: Alexis Frock, MD;  Location: [facility];  Service: Urology;  Laterality: Right;  . CYSTOSCOPY/RETROGRADE/URETEROSCOPY  06/04/2012   Procedure: CYSTOSCOPY/RETROGRADE/URETEROSCOPY;  Surgeon: Ailene Rud, MD;  Location: [facility];  Service: Urology;  Laterality: Left;  . UTERINE FIBROID EMBOLIZATION       FAMILY HISTORY: No family history on file. The patient has 1 brother, 2 sisters.  Both her parents died in their 40s.  There is no cancer history in the family to her knowledge   GYNECOLOGIC HISTORY:  No LMP recorded. Patient has had an ablation. 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  Her 2 sons are from her first marriage, [name] who is a Administrator, arts at [employer] who graduated from [employer] state in Merchandiser, retail.  In 2016 she married her second husband, Robin Wilson.  He is a Loss adjuster, chartered for [employer].  He has 3 children of his own living in [place] and [place].    The patient attends [employer] DIRECTIVES: In the absence of any documents to the contrary the patient's husband is her healthcare power of attorney   HEALTH MAINTENANCE: Social History   Tobacco Use  . Smoking status: Never Smoker  . Smokeless tobacco: Never Used  Substance Use Topics  . Alcohol use: No  . Drug use: No    Colonoscopy: Due  PAP: Up-to-date  Bone density:  Osteopenia Mammography: Up-to-date  Allergies  Allergen Reactions  . Sulfa Drugs Cross Reactors     From childhood per pt's mother , pt is unaware of allergy status    Current Outpatient Medications  Medication Sig Dispense Refill  . acyclovir (ZOVIRAX) 400 MG tablet Take 1 tablet (400 mg total) by mouth 2 (two) times daily. 60 tablet 3  . albuterol (PROVENTIL HFA;VENTOLIN HFA) 108 (90 Base) MCG/ACT inhaler Inhale 1-2 puffs into the lungs every 6 (six) hours as needed for wheezing or shortness of breath.    . calcium carbonate (OS-CAL - DOSED IN MG OF ELEMENTAL CALCIUM) 1250 MG tablet Take 1 tablet by mouth daily.      . cetirizine (ZYRTEC) 10 MG tablet Take 10 mg by mouth daily.     . cyclobenzaprine (FLEXERIL) 10 MG tablet Take 10 mg by mouth 3 (three) times daily as needed for muscle spasms.   0  . hydrOXYzine (ATARAX/VISTARIL) 10 MG tablet Take 1 tablet (10 mg total) by mouth every 4 (four) hours as needed for anxiety. 30 tablet 0  . ketorolac (TORADOL) 10 MG tablet Take 1 tablet by mouth 3 (three) times daily as needed  for moderate pain.   0  . losartan-hydrochlorothiazide (HYZAAR) 100-25 MG per tablet Take 1 tablet by mouth daily.     . Multiple Vitamins-Minerals (MULTIVITAMIN PO) Take 1 tablet by mouth daily.     . ondansetron (ZOFRAN) 4 MG tablet Take 1 tablet (4 mg total) by mouth every 6 (six) hours. 30 tablet 0  . oxyCODONE (OXY IR/ROXICODONE) 5 MG immediate release tablet Take 1-2 tablets (5-10 mg total) by mouth every 6 (six) hours as needed for severe pain. 60 tablet 0  . potassium chloride (KLOR-CON) 10 MEQ tablet Take 2 tablets (20 mEq total) by mouth 2 (two) times daily. 20 tablet 0  . prochlorperazine (COMPAZINE) 10 MG tablet Take 1 tablet (10 mg total) by mouth every 6 (six) hours as needed (Nausea or vomiting). 30 tablet 1  . promethazine (PHENERGAN) 25 MG tablet Take 1 tablet (25 mg total) by mouth every 6 (six) hours as needed for nausea. 12 tablet 0  . senna-docusate  (SENOKOT-S) 8.6-50 MG tablet Take 1 tablet by mouth 2 (two) times daily. While taking pain meds to prevent constipation 30 tablet 0  . sertraline (ZOLOFT) 50 MG tablet Take 75 mg by mouth daily.    . traZODone (DESYREL) 50 MG tablet Take 50 mg by mouth at bedtime.     No current facility-administered medications for this visit.     OBJECTIVE: White woman who appears younger than stated age  There were no vitals filed for this visit. Wt Readings from Last 3 Encounters:  03/31/20 127 lb (57.6 kg)  03/11/20 129 lb 12.8 oz (58.9 kg)  09/13/15 120 lb (54.4 kg)   There is no height or weight on file to calculate BMI.    ECOG FS:1 - Symptomatic but completely ambulatory GENERAL: Patient is a well appearing female in no acute distress HEENT:  Sclerae anicteric. Mask in place. Neck is supple.  NODES:  No cervical, supraclavicular, or axillary lymphadenopathy palpated.  BREAST EXAM:  Deferred. LUNGS:  Clear to auscultation bilaterally.  No wheezes or rhonchi. HEART:  Regular rate and rhythm. No murmur appreciated. ABDOMEN:  Soft, nontender.  Positive, normoactive bowel sounds. No organomegaly palpated. MSK:  No focal spinal tenderness to palpation.  EXTREMITIES:  No peripheral edema.   SKIN:  Clear with no obvious rashes or skin changes. No nail dyscrasia. NEURO:  Nonfocal. Well oriented.  Appropriate affect.       LAB RESULTS:  CMP     Component Value Date/Time   NA 141 03/31/2020 0820   K 3.1 (L) 03/31/2020 0820   CL 102 03/31/2020 0820   CO2 29 03/31/2020 0820   GLUCOSE 85 03/31/2020 0820   BUN 16 03/31/2020 0820   CREATININE 0.75 03/31/2020 0820   CALCIUM 10.2 03/31/2020 0820   PROT 6.7 03/31/2020 0820   ALBUMIN 4.2 03/31/2020 0820   AST 14 (L) 03/31/2020 0820   ALT 11 03/31/2020 0820   ALKPHOS 94 03/31/2020 0820   BILITOT 0.5 03/31/2020 0820   GFRNONAA >60 03/31/2020 0820   GFRAA >60 03/31/2020 0820    Lab Results  Component Value Date   TOTALPROTELP 6.1  03/05/2020     Lab Results  Component Value Date   KPAFRELGTCHN 919.4 (H) 03/05/2020   LAMBDASER 7.0 03/05/2020   KAPLAMBRATIO 4,449.95 (H) 03/24/2020   KAPLAMBRATIO 4,180.24 (H) 03/24/2020    Lab Results  Component Value Date   WBC 3.1 (L) 03/31/2020   NEUTROABS 1.3 (L) 03/31/2020   HGB 9.1 (L) 03/31/2020  HCT 27.0 (L) 03/31/2020   MCV 100.7 (H) 03/31/2020   PLT 136 (L) 03/31/2020    No results found for: LABCA2  No components found for: PZWCHE527  No results for input(s): INR in the last 168 hours.  No results found for: LABCA2  No results found for: POE423  No results found for: NTI144  No results found for: RXV400  No results found for: CA2729  No components found for: HGQUANT  No results found for: CEA1 / No results found for: CEA1   No results found for: AFPTUMOR  No results found for: CHROMOGRNA  No results found for: PSA1  No visits with results within 3 Day(s) from this visit.  Latest known visit with results is:  Appointment on 03/31/2020  Component Date Value Ref Range Status  . ABO/RH(D) 03/31/2020 O NEG   Final  . Antibody Screen 03/31/2020 NEG   Final  . Sample Expiration 03/31/2020    Final                   Value:04/03/2020,2359 Performed at [facility], [place] [address] [place], [place] [address]   . Sodium 03/31/2020 141  135 - 145 mmol/L Final  . Potassium 03/31/2020 3.1* 3.5 - 5.1 mmol/L Final  . Chloride 03/31/2020 102  98 - 111 mmol/L Final  . CO2 03/31/2020 29  22 - 32 mmol/L Final  . Glucose, Bld 03/31/2020 85  70 - 99 mg/dL Final   Glucose reference range applies only to samples taken after fasting for at least 8 hours.  . BUN 03/31/2020 16  6 - 20 mg/dL Final  . Creatinine, Ser 03/31/2020 0.75  0.44 - 1.00 mg/dL Final  . Calcium 03/31/2020 10.2  8.9 - 10.3 mg/dL Final  . Total Protein 03/31/2020 6.7  6.5 - 8.1 g/dL Final  . Albumin 03/31/2020 4.2  3.5 - 5.0 g/dL Final  . AST 03/31/2020 14* 15 - 41 U/L  Final  . ALT 03/31/2020 11  0 - 44 U/L Final  . Alkaline Phosphatase 03/31/2020 94  38 - 126 U/L Final  . Total Bilirubin 03/31/2020 0.5  0.3 - 1.2 mg/dL Final  . GFR calc non Af Amer 03/31/2020 >60  >60 mL/min Final  . GFR calc Af Amer 03/31/2020 >60  >60 mL/min Final  . Anion gap 03/31/2020 10  5 - 15 Final   Performed at [facility], [place] [address] [place], [place] [address]  . WBC Count 03/31/2020 3.1* 4.0 - 10.5 K/uL Final  . RBC 03/31/2020 2.68* 3.87 - 5.11 MIL/uL Final  . Hemoglobin 03/31/2020 9.1* 12.0 - 15.0 g/dL Final  . HCT 03/31/2020 27.0* 36 - 46 % Final  . MCV 03/31/2020 100.7* 80.0 - 100.0 fL Final  . MCH 03/31/2020 34.0  26.0 - 34.0 pg Final  . MCHC 03/31/2020 33.7  30.0 - 36.0 g/dL Final  . RDW 03/31/2020 18.6* 11.5 - 15.5 % Final  . Platelet Count 03/31/2020 136* 150 - 400 K/uL Final  . nRBC 03/31/2020 0.6* 0.0 - 0.2 % Final  . Neutrophils Relative % 03/31/2020 43  % Final  . Neutro Abs 03/31/2020 1.3* 1.7 - 7.7 K/uL Final  . Lymphocytes Relative 03/31/2020 52  % Final  . Lymphs Abs 03/31/2020 1.6  0.7 - 4.0 K/uL Final  . Monocytes Relative 03/31/2020 2  % Final  . Monocytes Absolute 03/31/2020 0.1  0 - 1 K/uL Final  . Eosinophils Relative 03/31/2020 3  % Final  .  Eosinophils Absolute 03/31/2020 0.1  0 - 0 K/uL Final  . Basophils Relative 03/31/2020 0  % Final  . Basophils Absolute 03/31/2020 0.0  0 - 0 K/uL Final  . WBC Morphology 03/31/2020 PLASMACYTOID LYMPHS   Final  . Smear Review 03/31/2020 RARE NRBC   Final  . Abs Immature Granulocytes 03/31/2020 0.00  0.00 - 0.07 K/uL Final  . Ovalocytes 03/31/2020 PRESENT   Final   Performed at [facility], [place] [address], [place], [place] [address]    (this displays the last labs from the last 3 days)  Lab Results  Component Value Date   TOTALPROTELP 6.1 03/05/2020   (this displays SPEP labs)  Lab Results  Component Value Date   KPAFRELGTCHN 919.4 (H)  03/05/2020   LAMBDASER 7.0 03/05/2020   KAPLAMBRATIO 4,449.95 (H) 03/24/2020   KAPLAMBRATIO 4,180.24 (H) 03/24/2020   (kappa/lambda light chains)  No results found for: HGBA, HGBA2QUANT, HGBFQUANT, HGBSQUAN (Hemoglobinopathy evaluation)   No results found for: LDH  No results found for: IRON, TIBC, IRONPCTSAT (Iron and TIBC)  No results found for: FERRITIN  Urinalysis    Component Value Date/Time   COLORURINE YELLOW 09/15/2015 2315   APPEARANCEUR CLOUDY (A) 09/15/2015 2315   LABSPEC 1.014 09/15/2015 2315   PHURINE 8.5 (H) 09/15/2015 2315   GLUCOSEU NEGATIVE 09/15/2015 2315   HGBUR SMALL (A) 09/15/2015 2315   BILIRUBINUR NEGATIVE 09/15/2015 2315   KETONESUR 15 (A) 09/15/2015 2315   PROTEINUR NEGATIVE 09/15/2015 2315   UROBILINOGEN 1.0 05/24/2013 1913   NITRITE NEGATIVE 09/15/2015 2315   LEUKOCYTESUR TRACE (A) 09/15/2015 2315     STUDIES:  DG Bone Survey Met  Result Date: 03/16/2020 CLINICAL DATA:  Multiple myeloma. EXAM: METASTATIC BONE SURVEY COMPARISON:  September 13, 2015. FINDINGS: Focal lesion is noted within midshaft of left radius with multiple calcifications which may represent enchondroma multilevel degenerative disc disease is noted in the lower cervical spine. Probable old midthoracic compression fracture is noted. Several small rounded lucencies are noted throughout the skull which may simply represent venous lakes, but lytic lesions related to myeloma cannot be excluded. No other significant abnormalities are noted throughout the spine, rib cage, pelvis or extremities. IMPRESSION: Focal calcified lesion is noted within midshaft of left radius which may represent enchondroma. Several small rounded lucencies are noted throughout the skull which may simply represent venous lakes, but lytic lesions related to myeloma cannot be excluded. Electronically Signed   By: Marijo Conception M.D.   On: 03/16/2020 12:38     ELIGIBLE FOR AVAILABLE RESEARCH PROTOCOL:  no   ASSESSMENT: 59 y.o. [place] woman presenting June 2021 with severe back pain, thoracic MRI 02/22/2020 suggestive of possible myeloma or lymphoma.  Work-up includes:  (a) 03/05/2020 Myeloma panel shows no M protein but all immunoglobulins are decreased.  The lambda ratio is markedly abnormal at 131.34; review of the blood film is not diagnostic  (b) 24-hour urine: markedly elevated Kappa light chains at 9,656; urine kappa/lambda ratio 4180.24, and urine M spike of 239.  (c) bone survey on 03/16/2020 shows rounded lucencies throughout the skull which could be venous lakes  (d) bone marrow biopsy on 7/27 confirms multiple myeloma with 44% plasma cells on the aspirate by manual differential, and 70 to 80% on the biopsy by CD138 immunohistochemistry  (e) cytogenetics and Myeloma FISH studies pending  (1) bortezomib/dexamethasone started 03/31/2020   PLAN: Gerry started treatment last week with Bortezomib and Dexamethasone for her newly diagnosed multiple myeloma.  She has tolerated this very well and she will receive this again today.  Her CBC remains stable.  She met with Dr. Jana Hakim about intensifying her treatment with Lenalidomide next week.  She will also be referred to [facility] regarding her multiple myeloma for evaluation and discussion about receiving transplant.  We will repeat her myeloma studies next week.    Mackenzee's back pain is improving, particularly with the dexamethasone.  She will continue the Ibuprofen and tylenol bid, and I refilled her Oxycodone 80m #60 today as well.  PMP aware reviewed, no red flags noted.    JDailahas had h/o hypokalemia, weight loss, and decreased appetite.  Her appetite is slightly improving, her weight is stable, and I recommended she continue her protein shakes if she doesn't feel like eating a meal.  Her potassium today is 3.2, up from 2.9 two weeks ago.  She is taking potassium 264m BID and she will continue that this week.  I sent in a  refill for a 30 day supply of this, but she understands that we will titrate how much she takes based on her weekly lab results.    JiCocoill return next week for labs, f/u, and her next Bortezomib.  She knows to call for any questions that may arise between now and her next appointment.  We are happy to see her sooner if needed.  Total encounter time: 30 minutes*    LiWilber BihariNP 04/08/20 7:46 AM Medical Oncology and Hematology [facility] [id number]. [phone]  Fax. [phone] ADDENDUM: JiZamiyaholerated her first dose of bevacizumab and low-dose dexamethasone last week without any side effects that she is aware of.  We still do not have the FINaplesesults from her bone marrow biopsy so I do not have a stage II "her but hopefully we will have that by the time she returns next week.  We asked her to check with your insurance to see which group is in network that might do her autologous transplant when we come to that.  [facility] We will [facility] her to that group and expect she would undergo autologous transplantation once she is in deep remission.  We are going to add lenalidomide to the bevacizumab and dexamethasone.  I am starting her at 15 mg a day, 14 days on 7 days off, even though her renal function is normal, because I am concerned regarding renal damage from her light chains.  We can always up the dose later if necessary and if she tolerates at the lower dose well.  Aspirin 325 mg a day is adequate DVT prophylaxis for this low risk treatment.  I personally saw this patient and performed a substantive portion of this encounter with the listed APP documented above.   GuChauncey CruelMD Medical Oncology and Hematology CoPeacht[facility] [id number]. [phone]  Fax. [phone]   *Total Encounter Time as defined by the Centers for Medicare and  Medicaid Services includes, in addition to the face-to-face time of a patient visit (documented in the note above) non-face-to-face time: obtaining and reviewing outside history, ordering and reviewing medications, tests or procedures, care coordination (communications with other health care professionals or caregivers) and documentation in the medical record.

## 2020-04-08 NOTE — Telephone Encounter (Signed)
Per phone call to pt this RN reviewed MD request for the patient to be enrolled for use of lenlidomide/Revlimid for new Multiple Myeloma diagnosis.  Discussed history of Thalomid and issue of known birth defects further study of why it caused birth defects found that it is very beneficial for cancers like multiple myeloma.  Reviewed safety concerns mandated by the company per use including birth defects, ( pt is postmenopausal ) safe handling and advised not to donate blood.  Debbe verbalized understanding. She understands she may have to periodically take a survey reviewing educational and safety concerns.  Form completed and sent to [facility].  Obtained [employer] auth number- and given to MD to write the prescription.

## 2020-04-09 ENCOUNTER — Telehealth: Payer: Self-pay | Admitting: Pharmacy Technician

## 2020-04-09 ENCOUNTER — Telehealth: Payer: Self-pay | Admitting: Pharmacist

## 2020-04-09 DIAGNOSIS — C9000 Multiple myeloma not having achieved remission: Secondary | ICD-10-CM

## 2020-04-09 LAB — BETA 2 MICROGLOBULIN, SERUM: Beta-2 Microglobulin: 4.4 mg/L — ABNORMAL HIGH (ref 0.6–2.4)

## 2020-04-09 MED ORDER — LENALIDOMIDE 15 MG PO CAPS: 15.0000 mg | ORAL_CAPSULE | Freq: Every day | ORAL | 6 refills | Status: DC

## 2020-04-09 NOTE — Telephone Encounter (Signed)
Oral Oncology Pharmacist Encounter  Received new prescription for Revlimid (lenalidomide) for the treatment of newly diagnosed multiple myeloma in conjunction with bortezomib and dexamethasone, planned duration 8 cycles.  Prescription dose and frequency assessed for appropriateness. OK for therapy initiation.   CBC w/ Diff and CMP from 04/08/20 assessed, patient's Hgb slightly down to 8.9 g/dL and pltc 133 K/uL - labs will continued to be monitored once on Revlimid. No dose adjustments required at this time.   Current medication list in Epic reviewed, no relevant/significant DDIs with Revlimid identified.  Evaluated chart and no patient barriers to medication adherence noted.   Revlimid is required to be filled through [facility]. Prescription has been redirected to their pharmacy and patient's demographic and insurance information faxed their pharmacy (Fax number: [phone])   [facility] will continue to follow for insurance authorization, copayment issues, initial counseling and start date.  Leron Croak, PharmD, BCPS Hematology/Oncology Clinical Pharmacist [facility] [phone] 04/09/2020 9:14 AM

## 2020-04-09 NOTE — Telephone Encounter (Signed)
Oral Oncology Patient Advocate Encounter  Prior Authorization for Revlimid has been approved.    PA# [id number] Effective dates: 03/10/20 through 04/09/2023  Oral Oncology Clinic will continue to follow.   [place] Patient [place] Phone [phone] Fax [phone] 04/09/2020 2:39 PM

## 2020-04-09 NOTE — Telephone Encounter (Signed)
Oral Oncology Patient Advocate Encounter  Received notification from [employer] that prior authorization for Revlimid is required.  PA submitted on CoverMyMeds Key [id number] Status is pending  Oral Oncology Clinic will continue to follow.  [place] Patient [place] Phone [phone] Fax [phone] 04/09/2020 2:37 PM

## 2020-04-10 ENCOUNTER — Encounter: Payer: Self-pay | Admitting: Adult Health

## 2020-04-10 ENCOUNTER — Other Ambulatory Visit: Payer: Self-pay | Admitting: *Deleted

## 2020-04-13 NOTE — Telephone Encounter (Addendum)
Oral Oncology Pharmacist Encounter   Called [employer] to check on the status of Revlimid (lenalidomide) for patient. Representative stated they are waiting on prior authorization for medication to be approved.   Provided prior authorization information to [employer] that was obtained on 04/10/20 for further processing of Revlimid.   Called Ms. Erhardt to provide update on the status of Revlimid, no answer. Left voicemail to return call.   Oral Oncology Clinic will continue to follow.  Leron Croak, PharmD, BCPS Hematology/Oncology Clinical Pharmacist [facility] [phone] 04/13/2020 3:20 PM

## 2020-04-13 NOTE — Telephone Encounter (Addendum)
Oral Chemotherapy Pharmacist Encounter  [facility] given PA approval information for further processing. Will update encounter until confirmed medication shipment and delivery to patient for start date of therapy.  I spoke with patient for overview of: Revlimid for the treatment of newly diagnosed multiple myeloma in conjunction with Velcade and dexamethasone, planned duration 8 cycles.   Counseled patient on administration, dosing, side effects, monitoring, drug-food interactions, safe handling, storage, and disposal.  Patient will take Revlimid 15mg capsules, 1 capsule by mouth once daily, without regard to food, with a full glass of water.  Revlimid will be given 14 days on, 7 days off, repeat every 21 days.  Patient will receive dexamethasone 4mg tablets, 10 tablets (40mg) by mouth once weekly with bortezomib injection.  Revlimid start date: Tentatively 04/15/20 pending medication shipment from [facility].  Adverse effects of Revlimid include but are not limited to: nausea, constipation, diarrhea, abdominal pain, rash, fatigue, drug fever, and decreased blood counts.    Reviewed with patient importance of keeping a medication schedule and plan for any missed doses. No barriers to medication adherence identified.  Medication reconciliation performed and medication/allergy list updated.  Patient has picked up acyclovir prescription Patient counseled on importance of daily aspirin 325mg for VTE prophylaxis.  Insurance authorization is pending PA approval per [employer] representative for Revlimid.  Revlimid prescription is being dispensed from [facility] as it is a limited distribution medication.  All questions answered.  Ms. Talford voiced understanding and appreciation.   Patient knows to call the office with questions or concerns.  Rebecca Fanning, PharmD, BCPS Hematology/Oncology Clinical Pharmacist [facility] [phone] 04/13/2020 3:27 PM     

## 2020-04-14 ENCOUNTER — Encounter: Payer: Self-pay | Admitting: Oncology

## 2020-04-14 NOTE — Telephone Encounter (Signed)
Oral Oncology Pharmacist Encounter   Called [facility] to check on status of Revlimid (lenalidomide). Representative stated that prescription was in the last steps of processing and patient should be receiving a phone call from [facility] to set up shipment by 04/15/20 AM.    Oral Oncology Clinic will continue to follow.   Leron Croak, PharmD, BCPS Hematology/Oncology Clinical Pharmacist [facility] [phone] 04/14/2020 3:01 PM

## 2020-04-15 ENCOUNTER — Inpatient Hospital Stay: Payer: BC Managed Care – PPO

## 2020-04-15 ENCOUNTER — Other Ambulatory Visit: Payer: Self-pay | Admitting: Oncology

## 2020-04-15 ENCOUNTER — Other Ambulatory Visit: Payer: BC Managed Care – PPO

## 2020-04-15 ENCOUNTER — Ambulatory Visit (HOSPITAL_COMMUNITY)
Admission: RE | Admit: 2020-04-15 | Discharge: 2020-04-15 | Disposition: A | Discharge status: Home / Self Care | Payer: BC Managed Care – PPO | Source: Ambulatory Visit | Attending: Adult Health | Admitting: Adult Health

## 2020-04-15 ENCOUNTER — Inpatient Hospital Stay (HOSPITAL_BASED_OUTPATIENT_CLINIC_OR_DEPARTMENT_OTHER): Payer: BC Managed Care – PPO | Admitting: Adult Health

## 2020-04-15 ENCOUNTER — Other Ambulatory Visit: Payer: Self-pay

## 2020-04-15 ENCOUNTER — Ambulatory Visit: Payer: BC Managed Care – PPO

## 2020-04-15 VITALS — HR 100

## 2020-04-15 VITALS — BP 138/82 | HR 102 | Temp 97.8°F | Resp 18 | Ht 70.0 in | Wt 126.7 lb

## 2020-04-15 DIAGNOSIS — C9000 Multiple myeloma not having achieved remission: Secondary | ICD-10-CM

## 2020-04-15 DIAGNOSIS — Z5112 Encounter for antineoplastic immunotherapy: Secondary | ICD-10-CM | POA: Diagnosis not present

## 2020-04-15 DIAGNOSIS — M546 Pain in thoracic spine: Secondary | ICD-10-CM | POA: Diagnosis not present

## 2020-04-15 DIAGNOSIS — D649 Anemia, unspecified: Secondary | ICD-10-CM | POA: Diagnosis not present

## 2020-04-15 LAB — COMPREHENSIVE METABOLIC PANEL
ALT: 14 U/L (ref 0–44)
AST: 13 U/L — ABNORMAL LOW (ref 15–41)
Albumin: 4 g/dL (ref 3.5–5.0)
Alkaline Phosphatase: 82 U/L (ref 38–126)
Anion gap: 10 (ref 5–15)
BUN: 19 mg/dL (ref 6–20)
CO2: 28 mmol/L (ref 22–32)
Calcium: 10 mg/dL (ref 8.9–10.3)
Chloride: 103 mmol/L (ref 98–111)
Creatinine, Ser: 0.74 mg/dL (ref 0.44–1.00)
GFR calc Af Amer: >60 mL/min (ref >60)
GFR calc non Af Amer: >60 mL/min (ref >60)
Glucose, Bld: 90 mg/dL (ref 70–99)
Potassium: 3.1 mmol/L — ABNORMAL LOW (ref 3.5–5.1)
Sodium: 141 mmol/L (ref 135–145)
Total Bilirubin: 0.4 mg/dL (ref 0.3–1.2)
Total Protein: 6.3 g/dL — ABNORMAL LOW (ref 6.5–8.1)

## 2020-04-15 LAB — CBC WITH DIFFERENTIAL (CANCER CENTER ONLY)
Abs Immature Granulocytes: 0.01 10*3/uL (ref 0.00–0.07)
Basophils Absolute: 0 10*3/uL (ref 0.0–0.1)
Basophils Relative: 0 %
Eosinophils Absolute: 0.1 10*3/uL (ref 0.0–0.5)
Eosinophils Relative: 3 %
HCT: 24.6 % — ABNORMAL LOW (ref 36.0–46.0)
Hemoglobin: 8.2 g/dL — ABNORMAL LOW (ref 12.0–15.0)
Immature Granulocytes: 0 %
Lymphocytes Relative: 52 %
Lymphs Abs: 1.9 10*3/uL (ref 0.7–4.0)
MCH: 33.5 pg (ref 26.0–34.0)
MCHC: 33.3 g/dL (ref 30.0–36.0)
MCV: 100.4 fL — ABNORMAL HIGH (ref 80.0–100.0)
Monocytes Absolute: 0.2 10*3/uL (ref 0.1–1.0)
Monocytes Relative: 6 %
Neutro Abs: 1.4 10*3/uL — ABNORMAL LOW (ref 1.7–7.7)
Neutrophils Relative %: 39 %
Platelet Count: 112 10*3/uL — ABNORMAL LOW (ref 150–400)
RBC: 2.45 MIL/uL — ABNORMAL LOW (ref 3.87–5.11)
RDW: 19 % — ABNORMAL HIGH (ref 11.5–15.5)
WBC Count: 3.7 10*3/uL — ABNORMAL LOW (ref 4.0–10.5)
nRBC: 0.5 % — ABNORMAL HIGH (ref 0.0–0.2)

## 2020-04-15 MED ORDER — DEXAMETHASONE 4 MG PO TABS
40.0000 mg | ORAL_TABLET | Freq: Once | ORAL | Status: AC
  Administered 2020-04-15: 40 mg via ORAL

## 2020-04-15 MED ORDER — DEXAMETHASONE 4 MG PO TABS
ORAL_TABLET | ORAL | Status: AC
  Filled 2020-04-15: qty 10

## 2020-04-15 MED ORDER — PROCHLORPERAZINE MALEATE 10 MG PO TABS
10.0000 mg | ORAL_TABLET | Freq: Once | ORAL | Status: AC
  Administered 2020-04-15: 10 mg via ORAL

## 2020-04-15 MED ORDER — BORTEZOMIB CHEMO SQ INJECTION 3.5 MG (2.5MG/ML)
1.3000 mg/m2 | Freq: Once | INTRAMUSCULAR | Status: AC
  Administered 2020-04-15: 2.25 mg via SUBCUTANEOUS
  Filled 2020-04-15: qty 0.9

## 2020-04-15 MED ORDER — PROCHLORPERAZINE MALEATE 10 MG PO TABS
ORAL_TABLET | ORAL | Status: AC
  Filled 2020-04-15: qty 1

## 2020-04-15 NOTE — Progress Notes (Signed)
ANC 1.4. okay to treat per Wilber Bihari, PA

## 2020-04-15 NOTE — Patient Instructions (Signed)
[facility] Discharge Instructions for Patients Receiving Chemotherapy  Today you received the following chemotherapy agents: Velcade.  To help prevent nausea and vomiting after your treatment, we encourage you to take your nausea medication as directed.   If you develop nausea and vomiting that is not controlled by your nausea medication, call the clinic.   BELOW ARE SYMPTOMS THAT SHOULD BE REPORTED IMMEDIATELY:  *FEVER GREATER THAN 100.5 F  *CHILLS WITH OR WITHOUT FEVER  NAUSEA AND VOMITING THAT IS NOT CONTROLLED WITH YOUR NAUSEA MEDICATION  *UNUSUAL SHORTNESS OF BREATH  *UNUSUAL BRUISING OR BLEEDING  TENDERNESS IN MOUTH AND THROAT WITH OR WITHOUT PRESENCE OF ULCERS  *URINARY PROBLEMS  *BOWEL PROBLEMS  UNUSUAL RASH Items with * indicate a potential emergency and should be followed up as soon as possible.  Feel free to call the clinic should you have any questions or concerns. The clinic phone number is [phone].  Please show the CHEMO ALERT CARD at check-in to the Emergency Department and triage nurse.  Bortezomib injection What is this medicine? BORTEZOMIB (bor TEZ oh mib) is a medicine that targets proteins in cancer cells and stops the cancer cells from growing. It is used to treat multiple myeloma and mantle-cell lymphoma. This medicine may be used for other purposes; ask your health care provider or pharmacist if you have questions. COMMON BRAND NAME(S): Velcade What should I tell my health care provider before I take this medicine? They need to know if you have any of these conditions:  diabetes  heart disease  irregular heartbeat  liver disease  on hemodialysis  low blood counts, like low white blood cells, platelets, or hemoglobin  peripheral neuropathy  taking medicine for blood pressure  an unusual or allergic reaction to bortezomib, mannitol, boron, other medicines, foods, dyes, or preservatives  pregnant or trying to get  pregnant  breast-feeding How should I use this medicine? This medicine is for injection into a vein or for injection under the skin. It is given by a health care professional in a hospital or clinic setting. Talk to your pediatrician regarding the use of this medicine in children. Special care may be needed. Overdosage: If you think you have taken too much of this medicine contact a poison control center or emergency room at once. NOTE: This medicine is only for you. Do not share this medicine with others. What if I miss a dose? It is important not to miss your dose. Call your doctor or health care professional if you are unable to keep an appointment. What may interact with this medicine? This medicine may interact with the following medications:  ketoconazole  rifampin  ritonavir  St. John's Wort This list may not describe all possible interactions. Give your health care provider a list of all the medicines, herbs, non-prescription drugs, or dietary supplements you use. Also tell them if you smoke, drink alcohol, or use illegal drugs. Some items may interact with your medicine. What should I watch for while using this medicine? You may get drowsy or dizzy. Do not drive, use machinery, or do anything that needs mental alertness until you know how this medicine affects you. Do not stand or sit up quickly, especially if you are an older patient. This reduces the risk of dizzy or fainting spells. In some cases, you may be given additional medicines to help with side effects. Follow all directions for their use. Call your doctor or health care professional for advice if you get a fever,   chills or sore throat, or other symptoms of a cold or flu. Do not treat yourself. This drug decreases your body's ability to fight infections. Try to avoid being around people who are sick. This medicine may increase your risk to bruise or bleed. Call your doctor or health care professional if you notice any  unusual bleeding. You may need blood work done while you are taking this medicine. In some patients, this medicine may cause a serious brain infection that may cause death. If you have any problems seeing, thinking, speaking, walking, or standing, tell your doctor right away. If you cannot reach your doctor, urgently seek other source of medical care. Check with your doctor or health care professional if you get an attack of severe diarrhea, nausea and vomiting, or if you sweat a lot. The loss of too much body fluid can make it dangerous for you to take this medicine. Do not become pregnant while taking this medicine or for at least 7 months after stopping it. Women should inform their doctor if they wish to become pregnant or think they might be pregnant. Men should not father a child while taking this medicine and for at least 4 months after stopping it. There is a potential for serious side effects to an unborn child. Talk to your health care professional or pharmacist for more information. Do not breast-feed an infant while taking this medicine or for 2 months after stopping it. This medicine may interfere with the ability to have a child. You should talk with your doctor or health care professional if you are concerned about your fertility. What side effects may I notice from receiving this medicine? Side effects that you should report to your doctor or health care professional as soon as possible:  allergic reactions like skin rash, itching or hives, swelling of the face, lips, or tongue  breathing problems  changes in hearing  changes in vision  fast, irregular heartbeat  feeling faint or lightheaded, falls  pain, tingling, numbness in the hands or feet  right upper belly pain  seizures  swelling of the ankles, feet, hands  unusual bleeding or bruising  unusually weak or tired  vomiting  yellowing of the eyes or skin Side effects that usually do not require medical  attention (report to your doctor or health care professional if they continue or are bothersome):  changes in emotions or moods  constipation  diarrhea  loss of appetite  headache  irritation at site where injected  nausea This list may not describe all possible side effects. Call your doctor for medical advice about side effects. You may report side effects to FDA at 1-800-FDA-1088. Where should I keep my medicine? This drug is given in a hospital or clinic and will not be stored at home. NOTE: This sheet is a summary. It may not cover all possible information. If you have questions about this medicine, talk to your doctor, pharmacist, or health care provider.  2020 Elsevier/Gold Standard (2017-12-25 16:29:31)  

## 2020-04-15 NOTE — Telephone Encounter (Signed)
Oral Oncology Pharmacist Encounter   Received fax notification from [facility] that patient's Revlimid (lenalidomide) is required to be filled through [facility]. Confirmed with [facility] that they have received the prescription and are processing the information and will reach out to patient to coordinate shipping.   [facility] contact information: Phone number: [phone] Fax number: [phone]   Oral Oncology Clinic will continue to follow.   Leron Croak, PharmD, BCPS Hematology/Oncology Clinical Pharmacist [facility] [phone] 04/15/2020 12:49 PM

## 2020-04-15 NOTE — Progress Notes (Signed)
[place]  [phone] Fax:([phone]) [phone]    ID: Robin Wilson DOB: May 23, 1961  MR#: [id number]  JHE#:[id number]  Patient Care Team: Angelina Pih, MD as PCP - General (Family Medicine) Magrinat, Virgie Dad, MD as Consulting Physician (Oncology) Latanya Maudlin, MD as Consulting Physician (Orthopedic Surgery) Everlene Farrier, MD as Consulting Physician (Obstetrics and Gynecology) Domingo Pulse, MD (Urology) Ronald Lobo, MD as Consulting Physician (Gastroenterology) OTHER MD:   CHIEF COMPLAINT: Light chain myeloma  CURRENT TREATMENT: Bortezomib, dexamethasone; Lenalidomide to be added today  INTERVAL HISTORY: Robin Wilson was evaluated in the hematology clinic on 03/31/2020 accompanied by her husband [name] as well as the patient's 2 sisters.  She underwent bone marrow biopsy on 03/24/2020 that confirmed multiple myeloma, with the presence of 44% plasma cells ina  80-90% cellular marrow.  This was also confirmed by her UPEP and kappa lambda chains.  Her diagnostic work-up is summarized below.    She started treatment with Bortezomib and Dexamethasone weekly, beginning last week, 03/31/2020.  She is due for another dose today.   She is due to start Lenalidomide when she receives this today.    REVIEW OF SYSTEMS: Robin Wilson has continued to have some mild back pain.  She wants to know what she can do about this.  She is taking Oxycodone as needed at night, but avoids taking it during the day.  On the days that she receives her dexamethasone, she notes her pain is slightly improved.  She notes her main source of pain is located in her mid thoracic spine and radiates around her back.    Robin Wilson denies any fever, chills, chest pain, palpitations, nausea, vomiting, peripheral neuropathy, bowel/bladder changes, or any further concerns.  A detailed ROS Was otherwise non contributory.    HISTORY OF CURRENT ILLNESS: From the original intake note:  Robin Wilson is a 59 y.o.  woman that presented with low and thoracic back pain to Dr. Latanya Maudlin. She notes muscle spasms and a low grade fever of 100 degrees.   Labs completed on 02/19/2020 revealed a CBC that was within normal limits except for RBC 2.39, Hgb 8.8, HCT 26.3, MCV 110.0, MCH 36.8, RDW 17.7, AMC 91. CMP was within normal limits except for potassium 3.3, CO2 35. Uric Acid was within normal limits. Urine Protein was abnormal at 57. C-reactive protein was within normal limits. ANA screening was negative.   Thorasic Spine MRI on 02/22/2020 revealed diffuse marrow T1 hypointensity involving vertibral bodies and some spinous processes associated with mild STTR hyperintensity with mild wedge compression fracture of T7 and mild right superior endplate compression fracture of T11 that are suspicious for pathologic fractures. There is mild central zone narrowing at mid T7 with minimal-deformity of the cord. Differential considerationsinclude Lymphoma, multiple myeloma, and diffuse metastatic disease. There is partially visualized mild-moderate spinal canal stenosis and mild deformity of the cord at C5-6 and C6-7 probable moderate-severe to severe-neutral foraminal narrowing which would be better assessed dedicated MRI of the cervical spine as clinically warranted.   Lab Results  Component Value Date   TOTALPROTELP 6.1 03/05/2020    Lab Results  Component Value Date   KPAFRELGTCHN 919.4 (H) 03/05/2020   LAMBDASER 7.0 03/05/2020   KAPLAMBRATIO 131.34 (H) 03/05/2020   Of note she had a CT of the abdomen on [facility]/03/2017 at [facility] which showed a well-defined sclerotic lesion in the left iliac bone consistent with a bone island and no lytic or sclerotic lesions anywhere else.  PAST MEDICAL HISTORY: Past Medical History:  Diagnosis Date  . Hypertension   . Kidney stones   . Kidney stones      PAST SURGICAL HISTORY: Past Surgical History:  Procedure Laterality Date  . CESAREAN SECTION    . CYSTOSCOPY  WITH RETROGRADE PYELOGRAM, URETEROSCOPY AND STENT PLACEMENT Right 09/16/2015   Procedure: CYSTOSCOPY WITH RETROGRADE PYELOGRAM, URETEROSCOPY AND STENT PLACEMENT WITH HOLMIUM LASER ABLATION ;  Surgeon: Alexis Frock, MD;  Location: [facility];  Service: Urology;  Laterality: Right;  . CYSTOSCOPY/RETROGRADE/URETEROSCOPY  06/04/2012   Procedure: CYSTOSCOPY/RETROGRADE/URETEROSCOPY;  Surgeon: Ailene Rud, MD;  Location: [facility];  Service: Urology;  Laterality: Left;  . UTERINE FIBROID EMBOLIZATION       FAMILY HISTORY: No family history on file. The patient has 1 brother, 2 sisters.  Both her parents died in their 79s.  There is no cancer history in the family to her knowledge   GYNECOLOGIC HISTORY:  No LMP recorded. Patient has had an ablation. 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  Her 2 sons are from her first marriage, [name] who is a Administrator, arts at [employer] who graduated from [employer] in Merchandiser, retail.  In 2016 she married her second husband, [name].  He is a Loss adjuster, chartered for [employer].  He has 3 children of his own living in [place] and [place].    The patient attends [employer] DIRECTIVES: In the absence of any documents to the contrary the patient's husband is her healthcare power of attorney   HEALTH MAINTENANCE: Social History   Tobacco Use  . Smoking status: Never Smoker  . Smokeless tobacco: Never Used  Substance Use Topics  . Alcohol use: No  . Drug use: No    Colonoscopy: Due  PAP: Up-to-date  Bone density: Osteopenia Mammography: Up-to-date  Allergies  Allergen Reactions  . Sulfa Drugs Cross Reactors     From childhood per pt's mother , pt is unaware of allergy status    Current Outpatient  Medications  Medication Sig Dispense Refill  . acyclovir (ZOVIRAX) 400 MG tablet Take 1 tablet (400 mg total) by mouth 2 (two) times daily. 60 tablet 3  . albuterol (PROVENTIL HFA;VENTOLIN HFA) 108 (90 Base) MCG/ACT inhaler Inhale 1-2 puffs into the lungs every 6 (six) hours as needed for wheezing or shortness of breath.    Marland Kitchen aspirin EC 325 MG tablet Take 1 tablet (325 mg total) by mouth daily. 30 tablet 0  . calcium carbonate (OS-CAL - DOSED IN MG OF ELEMENTAL CALCIUM) 1250 MG tablet Take 1 tablet by mouth daily.      . cetirizine (ZYRTEC) 10 MG tablet Take 10 mg by mouth daily.     . cyclobenzaprine (FLEXERIL) 10 MG tablet Take 10 mg by mouth 3 (three) times daily as needed for muscle spasms.   0  . hydrOXYzine (ATARAX/VISTARIL) 10 MG tablet Take 1 tablet (10 mg total) by mouth every 4 (four) hours as needed for anxiety. 30 tablet 0  . ibuprofen (ADVIL) 200 MG tablet Take 400 mg by mouth every 6 (six) hours as needed for moderate pain.    Marland Kitchen lenalidomide (REVLIMID) 15 MG capsule Take 1 capsule (15 mg total) by mouth daily. Fanny Dance #[id number]  Date Obtained 04/08/2020 14 capsule 6  . losartan-hydrochlorothiazide (HYZAAR) 100-25 MG per tablet Take 1 tablet by mouth daily.     . Multiple Vitamins-Minerals (MULTIVITAMIN PO) Take 1 tablet by mouth daily.     . ondansetron (ZOFRAN) 4 MG tablet Take 1 tablet (4 mg total) by mouth every 6 (six) hours. 30 tablet 0  . oxyCODONE (OXY IR/ROXICODONE) 5 MG immediate release tablet Take 1-2 tablets (5-10 mg total) by mouth every 6 (six) hours as needed for severe pain. 60 tablet 0  . potassium chloride (KLOR-CON) 10 MEQ tablet Take 2 tablets (20 mEq total) by mouth 2 (two) times daily. 120 tablet 0  . prochlorperazine (COMPAZINE) 10 MG tablet Take 1 tablet (10 mg total) by mouth every 6 (six) hours as needed (Nausea or vomiting). 30 tablet 1  . promethazine (PHENERGAN) 25 MG tablet Take 1 tablet (25 mg total) by mouth every 6 (six) hours as needed for  nausea. 12 tablet 0  . senna-docusate (SENOKOT-S) 8.6-50 MG tablet Take 1 tablet by mouth 2 (two) times daily. While taking pain meds to prevent constipation 30 tablet 0  . sertraline (ZOLOFT) 50 MG tablet Take 75 mg by mouth daily.    . traZODone (DESYREL) 50 MG tablet Take 50 mg by mouth at bedtime.     No current facility-administered medications for this visit.     OBJECTIVE: White woman who appears younger than stated age  70:   04/15/20 1525  BP: 138/82  Pulse: (!) 102  Resp: 18  Temp: 97.8 F (36.6 C)  SpO2: 100%   Wt Readings from Last 3 Encounters:  04/15/20 126 lb 11.2 oz (57.5 kg)  04/08/20 127 lb 1.6 oz (57.7 kg)  03/31/20 127 lb (57.6 kg)   Body mass index is 18.18 kg/m.    ECOG FS:1 - Symptomatic but completely ambulatory GENERAL: Patient is a well appearing female in no acute distress HEENT:  Sclerae anicteric. Mask in place. Neck is supple.  NODES:  No cervical, supraclavicular, or axillary lymphadenopathy palpated.  BREAST EXAM:  Deferred. LUNGS:  Clear to auscultation bilaterally.  No wheezes or rhonchi. HEART:  Regular rate and rhythm. No murmur appreciated. ABDOMEN:  Soft, nontender.  Positive, normoactive bowel sounds. No organomegaly palpated. MSK:  No focal spinal tenderness to palpation.  EXTREMITIES:  No peripheral edema.   SKIN:  Clear with no obvious rashes or skin changes. No nail dyscrasia. NEURO:  Nonfocal. Well oriented.  Appropriate affect.       LAB RESULTS:  CMP     Component Value Date/Time   NA 141 04/15/2020 1504   K 3.1 (L) 04/15/2020 1504   CL 103 04/15/2020 1504   CO2 28 04/15/2020 1504   GLUCOSE 90 04/15/2020 1504   BUN 19 04/15/2020 1504   CREATININE 0.74 04/15/2020 1504   CALCIUM 10.0 04/15/2020 1504   PROT 6.3 (L) 04/15/2020 1504   ALBUMIN 4.0 04/15/2020 1504   AST 13 (L) 04/15/2020 1504   ALT 14 04/15/2020 1504   ALKPHOS 82 04/15/2020 1504   BILITOT 0.4 04/15/2020 1504   GFRNONAA >60 04/15/2020 1504    GFRAA >60 04/15/2020 1504    Lab Results  Component Value Date   TOTALPROTELP 6.1 03/05/2020     Lab Results  Component Value Date   KPAFRELGTCHN 1,244.5 (H) 04/15/2020   LAMBDASER 3.4 (L) 04/15/2020   KAPLAMBRATIO 366.03 (H) 04/15/2020    Lab Results  Component Value Date   WBC 3.7 (L) 04/15/2020  NEUTROABS 1.4 (L) 04/15/2020   HGB 8.2 (L) 04/15/2020   HCT 24.6 (L) 04/15/2020   MCV 100.4 (H) 04/15/2020   PLT 112 (L) 04/15/2020    No results found for: LABCA2  No components found for: NUUVOZ366  No results for input(s): INR in the last 168 hours.  No results found for: LABCA2  No results found for: YQI347  No results found for: QQV956  No results found for: LOV564  No results found for: CA2729  No components found for: HGQUANT  No results found for: CEA1 / No results found for: CEA1   No results found for: AFPTUMOR  No results found for: CHROMOGRNA  No results found for: PSA1  Appointment on 04/15/2020  Component Date Value Ref Range Status  . Kappa free light chain 04/15/2020 1,244.5* 3.3 - 19.4 mg/L Final  . Lamda free light chains 04/15/2020 3.4* 5.7 - 26.3 mg/L Final  . Kappa, lamda light chain ratio 04/15/2020 366.03* 0.26 - 1.65 Final   Comment: (NOTE) Performed At: [facility] [place], [place] [id number] Rush Farmer MD [id number]   . Beta-2 Microglobulin 04/15/2020 3.5* 0.6 - 2.4 mg/L Final   Comment: (NOTE) Siemens Immulite 2000 Immunochemiluminometric assay (ICMA) Values obtained with different assay methods or kits cannot be used interchangeably. Results cannot be interpreted as absolute evidence of the presence or absence of malignant disease. Performed At: [facility] [place], [place] [id number] Rush Farmer MD [id number]   . WBC Count 04/15/2020 3.7* 4.0 - 10.5 K/uL Final  . RBC 04/15/2020 2.45* 3.87 - 5.11 MIL/uL Final  . Hemoglobin 04/15/2020 8.2* 12.0 - 15.0 g/dL Final   . HCT 04/15/2020 24.6* 36 - 46 % Final  . MCV 04/15/2020 100.4* 80.0 - 100.0 fL Final  . MCH 04/15/2020 33.5  26.0 - 34.0 pg Final  . MCHC 04/15/2020 33.3  30.0 - 36.0 g/dL Final  . RDW 04/15/2020 19.0* 11.5 - 15.5 % Final  . Platelet Count 04/15/2020 112* 150 - 400 K/uL Final  . nRBC 04/15/2020 0.5* 0.0 - 0.2 % Final  . Neutrophils Relative % 04/15/2020 39  % Final  . Neutro Abs 04/15/2020 1.4* 1.7 - 7.7 K/uL Final  . Lymphocytes Relative 04/15/2020 52  % Final  . Lymphs Abs 04/15/2020 1.9  0.7 - 4.0 K/uL Final  . Monocytes Relative 04/15/2020 6  % Final  . Monocytes Absolute 04/15/2020 0.2  0 - 1 K/uL Final  . Eosinophils Relative 04/15/2020 3  % Final  . Eosinophils Absolute 04/15/2020 0.1  0 - 0 K/uL Final  . Basophils Relative 04/15/2020 0  % Final  . Basophils Absolute 04/15/2020 0.0  0 - 0 K/uL Final  . Immature Granulocytes 04/15/2020 0  % Final  . Abs Immature Granulocytes 04/15/2020 0.01  0.00 - 0.07 K/uL Final   Performed at [facility], [place] [address] [place], [place] [address]  . Sodium 04/15/2020 141  135 - 145 mmol/L Final  . Potassium 04/15/2020 3.1* 3.5 - 5.1 mmol/L Final  . Chloride 04/15/2020 103  98 - 111 mmol/L Final  . CO2 04/15/2020 28  22 - 32 mmol/L Final  . Glucose, Bld 04/15/2020 90  70 - 99 mg/dL Final   Glucose reference range applies only to samples taken after fasting for at least 8 hours.  . BUN 04/15/2020 19  6 - 20 mg/dL Final  . Creatinine, Ser 04/15/2020 0.74  0.44 - 1.00 mg/dL Final  . Calcium  04/15/2020 10.0  8.9 - 10.3 mg/dL Final  . Total Protein 04/15/2020 6.3* 6.5 - 8.1 g/dL Final  . Albumin 04/15/2020 4.0  3.5 - 5.0 g/dL Final  . AST 04/15/2020 13* 15 - 41 U/L Final  . ALT 04/15/2020 14  0 - 44 U/L Final  . Alkaline Phosphatase 04/15/2020 82  38 - 126 U/L Final  . Total Bilirubin 04/15/2020 0.4  0.3 - 1.2 mg/dL Final  . GFR calc non Af Amer 04/15/2020 >60  >60 mL/min Final  . GFR calc Af Amer 04/15/2020 >60   >60 mL/min Final  . Anion gap 04/15/2020 10  5 - 15 Final   Performed at [facility], [place] [address] [place], [place] [address]    (this displays the last labs from the last 3 days)  Lab Results  Component Value Date   TOTALPROTELP 6.1 03/05/2020   (this displays SPEP labs)  Lab Results  Component Value Date   KPAFRELGTCHN 1,244.5 (H) 04/15/2020   LAMBDASER 3.4 (L) 04/15/2020   KAPLAMBRATIO 366.03 (H) 04/15/2020   (kappa/lambda light chains)  No results found for: HGBA, HGBA2QUANT, HGBFQUANT, HGBSQUAN (Hemoglobinopathy evaluation)   Lab Results  Component Value Date   LDH 240 (H) 04/08/2020    No results found for: IRON, TIBC, IRONPCTSAT (Iron and TIBC)  No results found for: FERRITIN  Urinalysis    Component Value Date/Time   COLORURINE YELLOW 09/15/2015 2315   APPEARANCEUR CLOUDY (A) 09/15/2015 2315   LABSPEC 1.014 09/15/2015 2315   PHURINE 8.5 (H) 09/15/2015 2315   GLUCOSEU NEGATIVE 09/15/2015 2315   HGBUR SMALL (A) 09/15/2015 2315   BILIRUBINUR NEGATIVE 09/15/2015 2315   KETONESUR 15 (A) 09/15/2015 2315   PROTEINUR NEGATIVE 09/15/2015 2315   UROBILINOGEN 1.0 05/24/2013 1913   NITRITE NEGATIVE 09/15/2015 2315   LEUKOCYTESUR TRACE (A) 09/15/2015 2315     STUDIES:  DG Thoracic Spine 2 View  Result Date: 04/15/2020 CLINICAL DATA:  Back pain. EXAM: THORACIC SPINE 2 VIEWS COMPARISON:  None. FINDINGS: A chronic compression fracture deformity is seen involving the T7 vertebral body. Alignment is normal. No other significant bone abnormalities are identified. IMPRESSION: Chronic compression fracture deformity of the T7 vertebral body. Electronically Signed   By: Virgina Norfolk M.D.   On: 04/15/2020 17:42     ELIGIBLE FOR AVAILABLE RESEARCH PROTOCOL: no   ASSESSMENT: 59 y.o. Robin Wilson woman presenting June 2021 with severe back pain, thoracic MRI 02/22/2020 suggestive of possible myeloma or lymphoma.  Work-up  includes:  (a) 03/05/2020 Myeloma panel shows no M protein but all immunoglobulins are decreased.  The lambda ratio is markedly abnormal at 131.34; review of the blood film is not diagnostic  (b) 24-hour urine: markedly elevated Kappa light chains at 9,656; urine kappa/lambda ratio 4180.24, and urine M spike of 239.  (c) bone survey on 03/16/2020 shows rounded lucencies throughout the skull which could be venous lakes  (d) bone marrow biopsy on 7/27 confirms multiple myeloma with 44% plasma cells on the aspirate by manual differential, and 70 to 80% on the biopsy by CD138 immunohistochemistry  (e) cytogenetics and Myeloma FISH studies pending  (1) bortezomib/dexamethasone started 03/31/2020   PLAN: Marva is here today for her f/u of her recently diagnosed multiple myeloma.  She has recently started on weekly Bortezomib and dexamethasone and is tolerating this quite well.  She will continue this.  We are re-checking a myeloma panel today to get an additional baseline prior to her starting triplet therapy.  She is due to start Lenalidomide.  I have sent our oral pharmacist to check on the status of this.  She understands that she will start a full dose aspirin when she starts this.   e. Her main issue is her pain.  WE will get plain films of her thoracic spine and consider referring her for a kyphoplasty if compression fracture is evident.  She will continue on oxycodone 5-10 mg every 6 hours as needed.  She will keep track of this, and let us know how it is controlling her pain.  She is taking Tyelnol and Advil during the day.  Her goal of pain control is to remain functional.      Pierre will return next week for labs, f/u, and her next Bortezomib.  She knows to call for any questions that may arise between now and her next appointment.  We are happy to see her sooner if needed.  Total encounter time: 20 minutes*    Wilber Bihari, NP 04/20/20 7:47 AM Medical Oncology and Hematology [facility] [place], [place] [address] Tel. [phone]    Fax. [phone]   *Total Encounter Time as defined by the Centers for Medicare and Medicaid Services includes, in addition to the face-to-face time of a patient visit (documented in the note above) non-face-to-face time: obtaining and reviewing outside history, ordering and reviewing medications, tests or procedures, care coordination (communications with other health care professionals or caregivers) and documentation in the medical record.

## 2020-04-16 ENCOUNTER — Other Ambulatory Visit: Payer: Self-pay | Admitting: Adult Health

## 2020-04-16 ENCOUNTER — Telehealth: Payer: Self-pay | Admitting: Adult Health

## 2020-04-16 DIAGNOSIS — C9000 Multiple myeloma not having achieved remission: Secondary | ICD-10-CM

## 2020-04-16 LAB — KAPPA/LAMBDA LIGHT CHAINS
Kappa free light chain: 1244.5 mg/L — ABNORMAL HIGH (ref 3.3–19.4)
Kappa, lambda light chain ratio: 366.03 — ABNORMAL HIGH (ref 0.26–1.65)
Lambda free light chains: 3.4 mg/L — ABNORMAL LOW (ref 5.7–26.3)

## 2020-04-16 LAB — BETA 2 MICROGLOBULIN, SERUM: Beta-2 Microglobulin: 3.5 mg/L — ABNORMAL HIGH (ref 0.6–2.4)

## 2020-04-16 NOTE — Telephone Encounter (Signed)
MR added 1 unit to appt that was already scheduled per 8/18 los. Thereasa Parkin add on to treatment and changed duration per 8/19 staff msg.

## 2020-04-20 LAB — MULTIPLE MYELOMA PANEL, SERUM
Albumin SerPl Elph-Mcnc: 3.8 g/dL (ref 2.9–4.4)
Albumin/Glob SerPl: 2.1 — ABNORMAL HIGH (ref 0.7–1.7)
Alpha 1: 0.2 g/dL (ref 0.0–0.4)
Alpha2 Glob SerPl Elph-Mcnc: 0.6 g/dL (ref 0.4–1.0)
B-Globulin SerPl Elph-Mcnc: 0.8 g/dL (ref 0.7–1.3)
Gamma Glob SerPl Elph-Mcnc: 0.3 g/dL — ABNORMAL LOW (ref 0.4–1.8)
Globulin, Total: 1.9 g/dL — ABNORMAL LOW (ref 2.2–3.9)
IgA: 10 mg/dL — ABNORMAL LOW (ref 87–352)
IgG (Immunoglobin G), Serum: 447 mg/dL — ABNORMAL LOW (ref 586–1602)
IgM (Immunoglobulin M), Srm: <5 mg/dL — ABNORMAL LOW (ref 26–217)
Total Protein ELP: 5.7 g/dL — ABNORMAL LOW (ref 6.0–8.5)

## 2020-04-23 ENCOUNTER — Other Ambulatory Visit: Payer: Self-pay | Admitting: Adult Health

## 2020-04-23 ENCOUNTER — Encounter: Payer: Self-pay | Admitting: Adult Health

## 2020-04-23 ENCOUNTER — Other Ambulatory Visit: Payer: Self-pay

## 2020-04-23 ENCOUNTER — Ambulatory Visit: Payer: BC Managed Care – PPO

## 2020-04-23 ENCOUNTER — Telehealth: Payer: Self-pay

## 2020-04-23 ENCOUNTER — Inpatient Hospital Stay (HOSPITAL_BASED_OUTPATIENT_CLINIC_OR_DEPARTMENT_OTHER): Payer: BC Managed Care – PPO | Admitting: Adult Health

## 2020-04-23 ENCOUNTER — Inpatient Hospital Stay: Payer: BC Managed Care – PPO

## 2020-04-23 VITALS — BP 112/67 | HR 95 | Temp 98.3°F | Resp 18

## 2020-04-23 VITALS — BP 113/59 | HR 100 | Temp 97.8°F | Resp 18 | Ht 70.0 in | Wt 129.0 lb

## 2020-04-23 DIAGNOSIS — C9000 Multiple myeloma not having achieved remission: Secondary | ICD-10-CM | POA: Diagnosis not present

## 2020-04-23 DIAGNOSIS — S22060A Wedge compression fracture of T7-T8 vertebra, initial encounter for closed fracture: Secondary | ICD-10-CM

## 2020-04-23 DIAGNOSIS — D649 Anemia, unspecified: Secondary | ICD-10-CM | POA: Diagnosis not present

## 2020-04-23 DIAGNOSIS — Z5112 Encounter for antineoplastic immunotherapy: Secondary | ICD-10-CM | POA: Diagnosis not present

## 2020-04-23 LAB — CBC WITH DIFFERENTIAL (CANCER CENTER ONLY)
Abs Immature Granulocytes: 0.01 10*3/uL (ref 0.00–0.07)
Basophils Absolute: 0 10*3/uL (ref 0.0–0.1)
Basophils Relative: 0 %
Eosinophils Absolute: 0.1 10*3/uL (ref 0.0–0.5)
Eosinophils Relative: 4 %
HCT: 23.5 % — ABNORMAL LOW (ref 36.0–46.0)
Hemoglobin: 7.9 g/dL — ABNORMAL LOW (ref 12.0–15.0)
Immature Granulocytes: 0 %
Lymphocytes Relative: 45 %
Lymphs Abs: 1.2 10*3/uL (ref 0.7–4.0)
MCH: 33.3 pg (ref 26.0–34.0)
MCHC: 33.6 g/dL (ref 30.0–36.0)
MCV: 99.2 fL (ref 80.0–100.0)
Monocytes Absolute: 0.1 10*3/uL (ref 0.1–1.0)
Monocytes Relative: 3 %
Neutro Abs: 1.2 10*3/uL — ABNORMAL LOW (ref 1.7–7.7)
Neutrophils Relative %: 48 %
Platelet Count: 86 10*3/uL — ABNORMAL LOW (ref 150–400)
RBC: 2.37 MIL/uL — ABNORMAL LOW (ref 3.87–5.11)
RDW: 19.4 % — ABNORMAL HIGH (ref 11.5–15.5)
WBC Count: 2.6 10*3/uL — ABNORMAL LOW (ref 4.0–10.5)
nRBC: 0.8 % — ABNORMAL HIGH (ref 0.0–0.2)

## 2020-04-23 LAB — PREPARE RBC (CROSSMATCH)

## 2020-04-23 MED ORDER — DIPHENHYDRAMINE HCL 25 MG PO CAPS
25.0000 mg | ORAL_CAPSULE | Freq: Once | ORAL | Status: AC
  Administered 2020-04-23: 25 mg via ORAL

## 2020-04-23 MED ORDER — DEXAMETHASONE 4 MG PO TABS
ORAL_TABLET | ORAL | Status: AC
  Filled 2020-04-23: qty 10

## 2020-04-23 MED ORDER — ACETAMINOPHEN 325 MG PO TABS
650.0000 mg | ORAL_TABLET | Freq: Once | ORAL | Status: AC
  Administered 2020-04-23: 650 mg via ORAL

## 2020-04-23 MED ORDER — DEXAMETHASONE 4 MG PO TABS
40.0000 mg | ORAL_TABLET | Freq: Once | ORAL | Status: AC
  Administered 2020-04-23: 40 mg via ORAL

## 2020-04-23 MED ORDER — SODIUM CHLORIDE 0.9% IV SOLUTION
250.0000 mL | Freq: Once | INTRAVENOUS | Status: AC
  Administered 2020-04-23: 250 mL via INTRAVENOUS
  Filled 2020-04-23: qty 250

## 2020-04-23 MED ORDER — PROCHLORPERAZINE MALEATE 10 MG PO TABS
10.0000 mg | ORAL_TABLET | Freq: Once | ORAL | Status: AC
  Administered 2020-04-23: 10 mg via ORAL

## 2020-04-23 MED ORDER — PROCHLORPERAZINE MALEATE 10 MG PO TABS
ORAL_TABLET | ORAL | Status: AC
  Filled 2020-04-23: qty 1

## 2020-04-23 MED ORDER — DIPHENHYDRAMINE HCL 25 MG PO CAPS
ORAL_CAPSULE | ORAL | Status: AC
  Filled 2020-04-23: qty 1

## 2020-04-23 MED ORDER — METHYLPREDNISOLONE 4 MG PO TABS: 4.0000 mg | ORAL_TABLET | Freq: Every day | ORAL | 0 refills | Status: DC

## 2020-04-23 MED ORDER — BORTEZOMIB CHEMO SQ INJECTION 3.5 MG (2.5MG/ML)
1.3000 mg/m2 | Freq: Once | INTRAMUSCULAR | Status: AC
  Administered 2020-04-23: 2.25 mg via SUBCUTANEOUS
  Filled 2020-04-23: qty 0.9

## 2020-04-23 MED ORDER — ACETAMINOPHEN 325 MG PO TABS
ORAL_TABLET | ORAL | Status: AC
  Filled 2020-04-23: qty 2

## 2020-04-23 NOTE — Progress Notes (Signed)
Ok to treat pt w/Velcade per Wilber Bihari, NP.

## 2020-04-23 NOTE — Progress Notes (Addendum)
[facility]  [phone] [phone]    ID: Robin Wilson DOB: [birth year]  MR#: [id number]  RFF#:[id number]  Patient Care Team: Angelina Pih, MD as PCP - General (Family Medicine) Magrinat, Virgie Dad, MD as Consulting Physician (Oncology) Latanya Maudlin, MD as Consulting Physician (Orthopedic Surgery) Everlene Farrier, MD as Consulting Physician (Obstetrics and Gynecology) Domingo Pulse, MD (Urology) Ronald Lobo, MD as Consulting Physician (Gastroenterology) OTHER MD:   CHIEF COMPLAINT: Light chain myeloma  CURRENT TREATMENT: Bortezomib, dexamethasone; Lenalidomide  INTERVAL HISTORY: Robin Wilson was evaluated in the hematology clinic on 03/31/2020 accompanied by her husband [name] as well as the patient's 2 sisters.  She underwent bone marrow biopsy on 03/24/2020 that confirmed multiple myeloma, with the presence of 44% plasma cells ina  80-90% cellular marrow.  This was also confirmed by her UPEP and kappa lambda chains.  Her diagnostic work-up is summarized below.    She started treatment with Bortezomib and Dexamethasone weekly, beginning last week, 03/31/2020.  She is due for another dose today.   She also started Lenalidomide on Tuesday.  This morning, after having started the lenalidomide, she woke up with a rash on her neck and forehead, it is spreading to her chest and abdomen.  She took her second pill of lenalidomide last night.  She thinks possibly she may have been exposed to her children's dog, but is unsure.    REVIEW OF SYSTEMS: Chevette has continued to have back pain.  She takes oxycodone at night, however during the day, she is working so she avoids taking any medications other than tylenol and advil.    She continues to work on her increased fluid intake.  She is becoming increasingly fatigued and worn out and has mild shortness of breath on exertion.    She denies any fever or chills.  She has no chest pain, palpitations, cough,  bowel/bladder changes, nasuea, or vomiting.  A detailed ROS was otherwise non contributory.    HISTORY OF CURRENT ILLNESS: From the original intake note:  Robin Wilson is a 59 y.o. woman that presented with low and thoracic back pain to Dr. Latanya Maudlin. She notes muscle spasms and a low grade fever of 100 degrees.   Labs completed on 02/19/2020 revealed a CBC that was within normal limits except for RBC 2.39, Hgb 8.8, HCT 26.3, MCV 110.0, MCH 36.8, RDW 17.7, AMC 91. CMP was within normal limits except for potassium 3.3, CO2 35. Uric Acid was within normal limits. Urine Protein was abnormal at 57. C-reactive protein was within normal limits. ANA screening was negative.   Thorasic Spine MRI on 02/22/2020 revealed diffuse marrow T1 hypointensity involving vertibral bodies and some spinous processes associated with mild STTR hyperintensity with mild wedge compression fracture of T7 and mild right superior endplate compression fracture of T11 that are suspicious for pathologic fractures. There is mild central zone narrowing at mid T7 with minimal-deformity of the cord. Differential considerationsinclude Lymphoma, multiple myeloma, and diffuse metastatic disease. There is partially visualized mild-moderate spinal canal stenosis and mild deformity of the cord at C5-6 and C6-7 probable moderate-severe to severe-neutral foraminal narrowing which would be better assessed dedicated MRI of the cervical spine as clinically warranted.   Lab Results  Component Value Date   TOTALPROTELP 6.1 03/05/2020    Lab Results  Component Value Date   KPAFRELGTCHN 919.4 (H) 03/05/2020   LAMBDASER 7.0 03/05/2020   KAPLAMBRATIO 131.34 (H) 03/05/2020   Of note she had a CT of the  abdomen on 01/03/2017 at [facility] which showed a well-defined sclerotic lesion in the left iliac bone consistent with a bone island and no lytic or sclerotic lesions anywhere else.     PAST MEDICAL HISTORY: Past Medical History:    Diagnosis Date  . Hypertension   . Kidney stones   . Kidney stones      PAST SURGICAL HISTORY: Past Surgical History:  Procedure Laterality Date  . CESAREAN SECTION    . CYSTOSCOPY WITH RETROGRADE PYELOGRAM, URETEROSCOPY AND STENT PLACEMENT Right 09/16/2015   Procedure: CYSTOSCOPY WITH RETROGRADE PYELOGRAM, URETEROSCOPY AND STENT PLACEMENT WITH HOLMIUM LASER ABLATION ;  Surgeon: Alexis Frock, MD;  Location: [facility];  Service: Urology;  Laterality: Right;  . CYSTOSCOPY/RETROGRADE/URETEROSCOPY  06/04/2012   Procedure: CYSTOSCOPY/RETROGRADE/URETEROSCOPY;  Surgeon: Ailene Rud, MD;  Location: [facility];  Service: Urology;  Laterality: Left;  . UTERINE FIBROID EMBOLIZATION       FAMILY HISTORY: History reviewed. No pertinent family history. The patient has 1 brother, 2 sisters.  Both her parents died in their 63s.  There is no cancer history in the family to her knowledge   GYNECOLOGIC HISTORY:  No LMP recorded. Patient has had an ablation. 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  Her 2 sons are from her first marriage, Robin Wilson who is a Administrator, arts at [employer] who graduated from [employer] in Merchandiser, retail.  In 2016 she married her second husband, [name].  He is a Loss adjuster, chartered for [employer].  He has 3 children of his own living in [place] and [place].    The patient attends [employer] DIRECTIVES: In the absence of any documents to the contrary the patient's husband is her healthcare power of attorney   HEALTH MAINTENANCE: Social History   Tobacco Use  . Smoking status: Never Smoker  . Smokeless tobacco: Never Used  Substance Use Topics  . Alcohol use: No  . Drug use: No    Colonoscopy: Due  PAP: Up-to-date  Bone  density: Osteopenia Mammography: Up-to-date  Allergies  Allergen Reactions  . Sulfa Drugs Cross Reactors     From childhood per pt's mother , pt is unaware of allergy status    Current Outpatient Medications  Medication Sig Dispense Refill  . acyclovir (ZOVIRAX) 400 MG tablet Take 1 tablet (400 mg total) by mouth 2 (two) times daily. 60 tablet 3  . albuterol (PROVENTIL HFA;VENTOLIN HFA) 108 (90 Base) MCG/ACT inhaler Inhale 1-2 puffs into the lungs every 6 (six) hours as needed for wheezing or shortness of breath.    Marland Kitchen aspirin EC 325 MG tablet Take 1 tablet (325 mg total) by mouth daily. 30 tablet 0  . calcium carbonate (OS-CAL - DOSED IN MG OF ELEMENTAL CALCIUM) 1250 MG tablet Take 1 tablet by mouth daily.      . cetirizine (ZYRTEC) 10 MG tablet Take 10 mg by mouth daily.     . cyclobenzaprine (FLEXERIL) 10 MG tablet Take 10 mg by mouth 3 (three) times daily as needed for muscle spasms.   0  . hydrOXYzine (ATARAX/VISTARIL) 10 MG tablet Take 1 tablet (10 mg total) by mouth every 4 (four) hours as needed for anxiety. 30 tablet 0  . ibuprofen (ADVIL) 200 MG tablet Take 400  mg by mouth every 6 (six) hours as needed for moderate pain.    Marland Kitchen lenalidomide (REVLIMID) 15 MG capsule Take 1 capsule (15 mg total) by mouth daily. Fanny Dance #[id number]     Date Obtained 04/08/2020 14 capsule 6  . losartan-hydrochlorothiazide (HYZAAR) 100-25 MG per tablet Take 1 tablet by mouth daily.     . Multiple Vitamins-Minerals (MULTIVITAMIN PO) Take 1 tablet by mouth daily.     . ondansetron (ZOFRAN) 4 MG tablet Take 1 tablet (4 mg total) by mouth every 6 (six) hours. 30 tablet 0  . oxyCODONE (OXY IR/ROXICODONE) 5 MG immediate release tablet Take 1-2 tablets (5-10 mg total) by mouth every 6 (six) hours as needed for severe pain. 60 tablet 0  . potassium chloride (KLOR-CON) 10 MEQ tablet Take 2 tablets (20 mEq total) by mouth 2 (two) times daily. 120 tablet 0  . prochlorperazine (COMPAZINE) 10 MG tablet Take 1 tablet  (10 mg total) by mouth every 6 (six) hours as needed (Nausea or vomiting). 30 tablet 1  . promethazine (PHENERGAN) 25 MG tablet Take 1 tablet (25 mg total) by mouth every 6 (six) hours as needed for nausea. 12 tablet 0  . senna-docusate (SENOKOT-S) 8.6-50 MG tablet Take 1 tablet by mouth 2 (two) times daily. While taking pain meds to prevent constipation 30 tablet 0  . sertraline (ZOLOFT) 50 MG tablet Take 75 mg by mouth daily.    . traZODone (DESYREL) 50 MG tablet Take 50 mg by mouth at bedtime.    . methylPREDNISolone (MEDROL) 4 MG tablet Take 1 tablet (4 mg total) by mouth daily. Taper 6,5,4,3,2,1 21 tablet 0   No current facility-administered medications for this visit.   Facility-Administered Medications Ordered in Other Visits  Medication Dose Route Frequency Provider Last Rate Last Admin  . bortezomib SQ (VELCADE) chemo injection 2.25 mg  1.3 mg/m2 (Treatment Plan Recorded) Subcutaneous Once Magrinat, Virgie Dad, MD      . dexamethasone (DECADRON) tablet 40 mg  40 mg Oral Once Magrinat, Virgie Dad, MD      . prochlorperazine (COMPAZINE) tablet 10 mg  10 mg Oral Once Magrinat, Virgie Dad, MD         OBJECTIVE:   Vitals:   04/23/20 1015  BP: (!) 113/59  Pulse: 100  Resp: 18  Temp: 97.8 F (36.6 C)  SpO2: 100%   Wt Readings from Last 3 Encounters:  04/23/20 129 lb (58.5 kg)  04/15/20 126 lb 11.2 oz (57.5 kg)  04/08/20 127 lb 1.6 oz (57.7 kg)   Body mass index is 18.51 kg/m.    ECOG FS:1 - Symptomatic but completely ambulatory GENERAL: Patient is a well appearing female in no acute distress HEENT:  Sclerae anicteric. Mask in place. Neck is supple.  NODES:  No cervical, supraclavicular, or axillary lymphadenopathy palpated.  BREAST EXAM:  Deferred. LUNGS:  Clear to auscultation bilaterally.  No wheezes or rhonchi. HEART:  Regular rate and rhythm. No murmur appreciated. ABDOMEN:  Soft, nontender.  Positive, normoactive bowel sounds. No organomegaly palpated. MSK:  No focal  spinal tenderness to palpation.  EXTREMITIES:  No peripheral edema.   SKIN:  See pictures below NEURO:  Nonfocal. Well oriented.  Appropriate affect.  Skin rash 04/23/2020      LAB RESULTS:  CMP     Component Value Date/Time   NA 141 04/15/2020 1504   K 3.1 (L) 04/15/2020 1504   CL 103 04/15/2020 1504   CO2 28 04/15/2020 1504   GLUCOSE 90  04/15/2020 1504   BUN 19 04/15/2020 1504   CREATININE 0.74 04/15/2020 1504   CALCIUM 10.0 04/15/2020 1504   PROT 6.3 (L) 04/15/2020 1504   ALBUMIN 4.0 04/15/2020 1504   AST 13 (L) 04/15/2020 1504   ALT 14 04/15/2020 1504   ALKPHOS 82 04/15/2020 1504   BILITOT 0.4 04/15/2020 1504   GFRNONAA >60 04/15/2020 1504   GFRAA >60 04/15/2020 1504    Lab Results  Component Value Date   TOTALPROTELP 5.7 (L) 04/15/2020     Lab Results  Component Value Date   KPAFRELGTCHN 1,244.5 (H) 04/15/2020   LAMBDASER 3.4 (L) 04/15/2020   KAPLAMBRATIO 366.03 (H) 04/15/2020    Lab Results  Component Value Date   WBC 2.6 (L) 04/23/2020   NEUTROABS 1.2 (L) 04/23/2020   HGB 7.9 (L) 04/23/2020   HCT 23.5 (L) 04/23/2020   MCV 99.2 04/23/2020   PLT 86 (L) 04/23/2020    No results found for: LABCA2  No components found for: ATFTDD220  No results for input(s): INR in the last 168 hours.  No results found for: LABCA2  No results found for: URK270  No results found for: WCB762  No results found for: GBT517  No results found for: CA2729  No components found for: HGQUANT  No results found for: CEA1 / No results found for: CEA1   No results found for: AFPTUMOR  No results found for: CHROMOGRNA  No results found for: [place] Visit on 04/23/2020  Component Date Value Ref Range Status  . Order Confirmation 04/23/2020    Final                   Value:ORDER PROCESSED BY BLOOD BANK Performed at [facility], [place] [address] [place], [place] [address]   Appointment on 04/23/2020  Component Date Value Ref Range Status   . ABO/RH(D) 04/23/2020 O NEG   Final  . Antibody Screen 04/23/2020 NEG   Final  . Sample Expiration 04/23/2020    Final                   Value:04/26/2020,2359 Performed at [facility], [place] [address], [place], [place] [address]   . Unit Number 04/23/2020 [id number]   Final  . Blood Component Type 04/23/2020 RED CELLS,LR   Final  . Unit division 04/23/2020 00   Final  . Status of Unit 04/23/2020 ALLOCATED   Final  . Transfusion Status 04/23/2020 OK TO TRANSFUSE   Final  . Crossmatch Result 04/23/2020 Compatible   Final  . Unit Number 04/23/2020 [id number]   Final  . Blood Component Type 04/23/2020 RED CELLS,LR   Final  . Unit division 04/23/2020 00   Final  . Status of Unit 04/23/2020 ALLOCATED   Final  . Transfusion Status 04/23/2020 OK TO TRANSFUSE   Final  . Crossmatch Result 04/23/2020 Compatible   Final  . WBC Count 04/23/2020 2.6* 4.0 - 10.5 K/uL Final  . RBC 04/23/2020 2.37* 3.87 - 5.11 MIL/uL Final  . Hemoglobin 04/23/2020 7.9* 12.0 - 15.0 g/dL Final  . HCT 04/23/2020 23.5* 36 - 46 % Final  . MCV 04/23/2020 99.2  80.0 - 100.0 fL Final  . MCH 04/23/2020 33.3  26.0 - 34.0 pg Final  . MCHC 04/23/2020 33.6  30.0 - 36.0 g/dL Final  . RDW 04/23/2020 19.4* 11.5 - 15.5 % Final  . Platelet Count 04/23/2020 86* 150 - 400 K/uL Final  . nRBC 04/23/2020 0.8* 0.0 - 0.2 %  Final  . Neutrophils Relative % 04/23/2020 48  % Final  . Neutro Abs 04/23/2020 1.2* 1.7 - 7.7 K/uL Final  . Lymphocytes Relative 04/23/2020 45  % Final  . Lymphs Abs 04/23/2020 1.2  0.7 - 4.0 K/uL Final  . Monocytes Relative 04/23/2020 3  % Final  . Monocytes Absolute 04/23/2020 0.1  0 - 1 K/uL Final  . Eosinophils Relative 04/23/2020 4  % Final  . Eosinophils Absolute 04/23/2020 0.1  0 - 0 K/uL Final  . Basophils Relative 04/23/2020 0  % Final  . Basophils Absolute 04/23/2020 0.0  0 - 0 K/uL Final  . WBC Morphology 04/23/2020 VARIANT LYMPHS   Final  . Immature Granulocytes 04/23/2020 0   % Final  . Abs Immature Granulocytes 04/23/2020 0.01  0.00 - 0.07 K/uL Final  . Polychromasia 04/23/2020 PRESENT   Final  . Ovalocytes 04/23/2020 PRESENT   Final   Performed at [facility], [place] [address] [place], [place] [address]  . Blood Product Unit Number 04/23/2020 [id number]   Final  . PRODUCT CODE 04/23/2020 [id number]   Final  . Unit Type and Rh 04/23/2020 9500   Final  . Blood Product Expiration Date 04/23/2020 [id number]   Final  . Blood Product Unit Number 04/23/2020 [id number]   Final  . PRODUCT CODE 04/23/2020 [id number]   Final  . Unit Type and Rh 04/23/2020 9500   Final  . Blood Product Expiration Date 04/23/2020 [id number]   Final    (this displays the last labs from the last 3 days)  Lab Results  Component Value Date   TOTALPROTELP 5.7 (L) 04/15/2020   (this displays SPEP labs)  Lab Results  Component Value Date   KPAFRELGTCHN 1,244.5 (H) 04/15/2020   LAMBDASER 3.4 (L) 04/15/2020   KAPLAMBRATIO 366.03 (H) 04/15/2020   (kappa/lambda light chains)  No results found for: HGBA, HGBA2QUANT, HGBFQUANT, HGBSQUAN (Hemoglobinopathy evaluation)   Lab Results  Component Value Date   LDH 240 (H) 04/08/2020    No results found for: IRON, TIBC, IRONPCTSAT (Iron and TIBC)  No results found for: FERRITIN  Urinalysis    Component Value Date/Time   COLORURINE YELLOW 09/15/2015 2315   APPEARANCEUR CLOUDY (A) 09/15/2015 2315   LABSPEC 1.014 09/15/2015 2315   PHURINE 8.5 (H) 09/15/2015 2315   GLUCOSEU NEGATIVE 09/15/2015 2315   HGBUR SMALL (A) 09/15/2015 2315   BILIRUBINUR NEGATIVE 09/15/2015 2315   KETONESUR 15 (A) 09/15/2015 2315   PROTEINUR NEGATIVE 09/15/2015 2315   UROBILINOGEN 1.0 05/24/2013 1913   NITRITE NEGATIVE 09/15/2015 2315   LEUKOCYTESUR TRACE (A) 09/15/2015 2315     STUDIES:  DG Thoracic Spine 2 View  Result Date: 04/15/2020 CLINICAL DATA:  Back pain. EXAM: THORACIC SPINE 2 VIEWS COMPARISON:  None. FINDINGS:  A chronic compression fracture deformity is seen involving the T7 vertebral body. Alignment is normal. No other significant bone abnormalities are identified. IMPRESSION: Chronic compression fracture deformity of the T7 vertebral body. Electronically Signed   By: Virgina Norfolk M.D.   On: 04/15/2020 17:42     ELIGIBLE FOR AVAILABLE RESEARCH PROTOCOL: no   ASSESSMENT: 59 y.o. [place] woman presenting June 2021 with severe back pain, thoracic MRI 02/22/2020 suggestive of possible myeloma or lymphoma.  Work-up includes:  (a) 03/05/2020 Myeloma panel shows no M protein but all immunoglobulins are decreased.  The lambda ratio is markedly abnormal at 131.34; review of the blood film is not diagnostic  (b) 24-hour urine: markedly elevated Kappa light chains  at 9,656; urine kappa/lambda ratio 4180.24, and urine M spike of 239.  (c) bone survey on 03/16/2020 shows rounded lucencies throughout the skull which could be venous [place]  (d) bone marrow biopsy on 7/27 confirms multiple myeloma with 44% plasma cells on the aspirate by manual differential, and 70 to 80% on the biopsy by CD138 immunohistochemistry  (e) cytogenetics and Myeloma FISH studies pending  (1) bortezomib/dexamethasone started 03/31/2020  (a) Lenalidomide added on 04/21/2020, stopped on 8/26 after two doses secondary to rash   PLAN: Kataryna is here today for follow up and evaluation of her multiple myeloma prior to receiving Bortezomib and Dexamethasone.  She is tolerating this well and will continue this.  She is not having any peripheral neuropathy.    The rash is concerning that it may indicate an allergy to Lenalidomide, however it is atypical presentation.  She will stop Lenalidomide, will start a medrol dosepak, and she will call us on Tuesday of next week.  If the rash has gone, then she will restart the Lenalidomide and we will try again.    She continues to have significant back pain and is taking Oxycodone at  night for this and tylenol and aleve for pain.  She will continue this.  My nurse is calling IR to see about getting her in for kyphoplasty.  I placed orders for this last week.    Dimitri is anemic today and will receive 2 units of blood.  I have placed orders for these.   Cayman will return next week for labs and her next Bortezomib.  We will see her back in 2 weeks.  She knows to call for any questions that may arise between now and her next appointment.  We are happy to see her sooner if needed.  Total encounter time: 20 minutes*    Wilber Bihari, NP 04/23/20 11:57 AM Medical Oncology and Hematology [facility] [place], [place] [address] Tel. [phone]    Fax. [phone]   ADDENDUM: Keymoni developed an unusual rash a couple of days after starting lenalidomide.  This is not the type of rash that I usually associate with this medication or with drug rash in general.  It could be something else.  In any case we are holding the lenalidomide, covering with steroids (and she will also receive dexamethasone today).  I am going to see her next week on 0901 when she comes in for her next treatment and reassess.  We are obtaining a 24-hour urine again today.  We should have those results by then.  Hopefully we will have the [place] results from her bone marrow biopsy also at that point so we can give her staging which is something she has been wanting.  Once we have this information she will be referred to [facility] so they can start considering autologous transplantation.  If she indeed is allergic to lenalidomide we would have to consider other options.  I personally saw this patient and performed a substantive portion of this encounter with the listed APP documented above.   Chauncey Cruel, MD Medical Oncology and Hematology [facility] [address] [place],  [address] Tel. [phone]    Fax. [phone]    *Total Encounter  Time as defined by the Centers for Medicare and Medicaid Services includes, in addition to the face-to-face time of a patient visit (documented in the note above) non-face-to-face time: obtaining and reviewing outside history, ordering and reviewing medications, tests  or procedures, care coordination (communications with other health care professionals or caregivers) and documentation in the medical record.

## 2020-04-23 NOTE — Progress Notes (Signed)
[id number]

## 2020-04-23 NOTE — Telephone Encounter (Signed)
Called IR per Delice Bison, NP to get kyphoplasty for pt scheduled. IR states [facility] needs to be contacted for this. Spoke with [facility] who states they will call pt to schedule consult. Pt is aware she needs to call us back if she does not hear from them.

## 2020-04-23 NOTE — Patient Instructions (Signed)
[facility] Discharge Instructions for Patients Receiving Chemotherapy  Today you received the following chemotherapy agents Bortezomib (VELCADE).  To help prevent nausea and vomiting after your treatment, we encourage you to take your nausea medication as prescribed.   If you develop nausea and vomiting that is not controlled by your nausea medication, call the clinic.   BELOW ARE SYMPTOMS THAT SHOULD BE REPORTED IMMEDIATELY:  *FEVER GREATER THAN 100.5 F  *CHILLS WITH OR WITHOUT FEVER  NAUSEA AND VOMITING THAT IS NOT CONTROLLED WITH YOUR NAUSEA MEDICATION  *UNUSUAL SHORTNESS OF BREATH  *UNUSUAL BRUISING OR BLEEDING  TENDERNESS IN MOUTH AND THROAT WITH OR WITHOUT PRESENCE OF ULCERS  *URINARY PROBLEMS  *BOWEL PROBLEMS  UNUSUAL RASH Items with * indicate a potential emergency and should be followed up as soon as possible.  Feel free to call the clinic should you have any questions or concerns. The clinic phone number is [phone].  Please show the [place] at check-in to the Emergency Department and triage nurse.   Blood Transfusion, Adult, Care After This sheet gives you information about how to care for yourself after your procedure. Your doctor may also give you more specific instructions. If you have problems or questions, contact your doctor. What can I expect after the procedure? After the procedure, it is common to have:  Bruising and soreness at the IV site.  A fever or chills on the day of the procedure. This may be your body's response to the new blood cells received.  A headache. Follow these instructions at home: Insertion site care      Follow instructions from your doctor about how to take care of your insertion site. This is where an IV tube was put into your vein. Make sure you: ? Wash your hands with soap and water before and after you change your bandage (dressing). If you cannot use soap and water, use hand  sanitizer. ? Change your bandage as told by your doctor.  Check your insertion site every day for signs of infection. Check for: ? Redness, swelling, or pain. ? Bleeding from the site. ? Warmth. ? Pus or a bad smell. General instructions  Take over-the-counter and prescription medicines only as told by your doctor.  Rest as told by your doctor.  Go back to your normal activities as told by your doctor.  Keep all follow-up visits as told by your doctor. This is important. Contact a doctor if:  You have itching or red, swollen areas of skin (hives).  You feel worried or nervous (anxious).  You feel weak after doing your normal activities.  You have redness, swelling, warmth, or pain around the insertion site.  You have blood coming from the insertion site, and the blood does not stop with pressure.  You have pus or a bad smell coming from the insertion site. Get help right away if:  You have signs of a serious reaction. This may be coming from an allergy or the body's defense system (immune system). Signs include: ? Trouble breathing or shortness of breath. ? Swelling of the face or feeling warm (flushed). ? Fever or chills. ? Head, chest, or back pain. ? Dark pee (urine) or blood in the pee. ? Widespread rash. ? Fast heartbeat. ? Feeling dizzy or light-headed. You may receive your blood transfusion in an outpatient setting. If so, you will be told whom to contact to report any reactions. These symptoms may be an emergency. Do not wait to  see if the symptoms will go away. Get medical help right away. Call your local emergency services (911 in the [place] Do not drive yourself to the hospital. Summary  Bruising and soreness at the IV site are common.  Check your insertion site every day for signs of infection.  Rest as told by your doctor. Go back to your normal activities as told by your doctor.  Get help right away if you have signs of a serious reaction. This  information is not intended to replace advice given to you by your health care provider. Make sure you discuss any questions you have with your health care provider. Document Revised: 02/07/2019 Document Reviewed: 02/07/2019 Elsevier Patient Education  [place].

## 2020-04-24 LAB — TYPE AND SCREEN
ABO/RH(D): O NEG
Antibody Screen: NEGATIVE
Unit division: 0
Unit division: 0

## 2020-04-24 LAB — BPAM RBC
Blood Product Expiration Date: 202109222359
Blood Product Expiration Date: 202109252359
ISSUE DATE / TIME: 202108261234
ISSUE DATE / TIME: 202108261234
Unit Type and Rh: 9500
Unit Type and Rh: 9500

## 2020-04-25 ENCOUNTER — Inpatient Hospital Stay: Payer: BC Managed Care – PPO

## 2020-04-27 ENCOUNTER — Other Ambulatory Visit: Payer: Self-pay | Admitting: Oncology

## 2020-04-27 ENCOUNTER — Other Ambulatory Visit (HOSPITAL_COMMUNITY): Payer: Self-pay | Admitting: Interventional Radiology

## 2020-04-27 LAB — UPEP/UIFE/LIGHT CHAINS/TP, 24-HR UR
% BETA, Urine: 9 %
ALPHA 1 URINE: 1 %
Albumin, U: 3.6 %
Alpha 2, Urine: 1.8 %
Free Kappa Lt Chains,Ur: 6982.77 mg/L — ABNORMAL HIGH (ref 0.63–113.79)
Free Kappa/Lambda Ratio: 3879.32 — ABNORMAL HIGH (ref 1.03–31.76)
Free Lambda Lt Chains,Ur: 1.8 mg/L (ref 0.47–11.77)
GAMMA GLOBULIN URINE: 84.6 %
M-SPIKE %, Urine: 77.2 % — ABNORMAL HIGH
M-Spike, Mg/24 Hr: 802 mg/24 hr — ABNORMAL HIGH
Total Protein, Urine-Ur/day: 1039 mg/24 hr — ABNORMAL HIGH (ref 30–150)
Total Protein, Urine: 83.1 mg/dL
Total Volume: 1250

## 2020-04-28 NOTE — Progress Notes (Signed)
[facility]  [phone] [phone]    ID: Robin Wilson DOB: 01-Jul-1961  MR#: [id number]  HYI#:[id number]  Patient Care Team: Angelina Pih, MD as PCP - General (Family Medicine) Koleson Reifsteck, Virgie Dad, MD as Consulting Physician (Oncology) Latanya Maudlin, MD as Consulting Physician (Orthopedic Surgery) Everlene Farrier, MD as Consulting Physician (Obstetrics and Gynecology) Domingo Pulse, MD (Urology) Ronald Lobo, MD as Consulting Physician (Gastroenterology) OTHER MD: Algis Liming. Lambird MD]  CHIEF COMPLAINT: Light chain myeloma  CURRENT TREATMENT: Bortezomib, dexamethasone; Lenalidomide   INTERVAL HISTORY: Robin Wilson returns today for follow up of her myeloma.  She underwent bone marrow biopsy on 03/24/2020 that confirmed multiple myeloma, with the presence of 44% plasma cells ina  80-90% cellular marrow.  This was also confirmed by her UPEP and kappa lambda chains.  Her diagnostic work-up is summarized below.    She continues Bortezomib and Dexamethasone weekly, started 03/31/2020.  She is due for another dose today.   She also started Lenalidomide, but this was held last week due to a rash.  She was started on a Medrol Dosepak and the rash cleared within a day.  She is now entirely asymptomatic and eager to start lenalidomide again.   REVIEW OF SYSTEMS: Robin Wilson is tolerating her treatment remarkably well with no side effects that she is aware of.  A detailed review of systems today was otherwise noncontributory   HISTORY OF CURRENT ILLNESS: From the original intake note:  Robin Wilson is a 59 y.o. woman that presented with low and thoracic back pain to Dr. Latanya Maudlin. She notes muscle spasms and a low grade fever of 100 degrees.   Labs completed on 02/19/2020 revealed a CBC that was within normal limits except for RBC 2.39, Hgb 8.8, HCT 26.3, MCV 110.0, MCH 36.8, RDW 17.7, AMC 91. CMP was within normal limits except for potassium 3.3, CO2  35. Uric Acid was within normal limits. Urine Protein was abnormal at 57. C-reactive protein was within normal limits. ANA screening was negative.   Thorasic Spine MRI on 02/22/2020 revealed diffuse marrow T1 hypointensity involving vertibral bodies and some spinous processes associated with mild STTR hyperintensity with mild wedge compression fracture of T7 and mild right superior endplate compression fracture of T11 that are suspicious for pathologic fractures. There is mild central zone narrowing at mid T7 with minimal-deformity of the cord. Differential considerationsinclude Lymphoma, multiple myeloma, and diffuse metastatic disease. There is partially visualized mild-moderate spinal canal stenosis and mild deformity of the cord at C5-6 and C6-7 probable moderate-severe to severe-neutral foraminal narrowing which would be better assessed dedicated MRI of the cervical spine as clinically warranted.   Lab Results  Component Value Date   TOTALPROTELP 6.1 03/05/2020    Lab Results  Component Value Date   KPAFRELGTCHN 919.4 (H) 03/05/2020   LAMBDASER 7.0 03/05/2020   KAPLAMBRATIO 131.34 (H) 03/05/2020   Of note she had a CT of the abdomen on 01/03/2017 at [facility] which showed a well-defined sclerotic lesion in the left iliac bone consistent with a bone island and no lytic or sclerotic lesions anywhere else.   PAST MEDICAL HISTORY: Past Medical History:  Diagnosis Date  . Hypertension   . Kidney stones   . Kidney stones     PAST SURGICAL HISTORY: Past Surgical History:  Procedure Laterality Date  . CESAREAN SECTION    . CYSTOSCOPY WITH RETROGRADE PYELOGRAM, URETEROSCOPY AND STENT PLACEMENT Right 09/16/2015   Procedure: CYSTOSCOPY WITH RETROGRADE PYELOGRAM, URETEROSCOPY AND STENT  PLACEMENT WITH HOLMIUM LASER ABLATION ;  Surgeon: Alexis Frock, MD;  Location: [facility];  Service: Urology;  Laterality: Right;  . CYSTOSCOPY/RETROGRADE/URETEROSCOPY  06/04/2012   Procedure:  CYSTOSCOPY/RETROGRADE/URETEROSCOPY;  Surgeon: Ailene Rud, MD;  Location: [facility];  Service: Urology;  Laterality: Left;  . UTERINE FIBROID EMBOLIZATION      FAMILY HISTORY: No family history on file. The patient has 1 brother, 2 sisters.  Both her parents died in their 36s.  There is no cancer history in the family to her knowledge   GYNECOLOGIC HISTORY:  No LMP recorded. Patient has had an ablation. 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  Her 2 sons are from her first marriage, Robin Wilson who is a Administrator, arts at [employer] who graduated from [employer] in Merchandiser, retail.  In 2016 she married her second husband, Robin Wilson.  He is a Loss adjuster, chartered for [employer].  He has 3 children of his own living in [place] and [place].    The patient attends [employer] DIRECTIVES: In the absence of any documents to the contrary the patient's husband is her healthcare power of attorney   HEALTH MAINTENANCE: Social History   Tobacco Use  . Smoking status: Never Smoker  . Smokeless tobacco: Never Used  Substance Use Topics  . Alcohol use: No  . Drug use: No    Colonoscopy: Due  PAP: Up-to-date  Bone density: Osteopenia Mammography: Up-to-date  Allergies  Allergen Reactions  . Sulfa Drugs Cross Reactors     From childhood per pt's mother , pt is unaware of allergy status    Current Outpatient Medications  Medication Sig Dispense Refill  . acyclovir (ZOVIRAX) 400 MG tablet Take 1 tablet (400 mg total) by mouth 2 (two) times daily. 60 tablet 3  . albuterol (PROVENTIL HFA;VENTOLIN HFA) 108 (90 Base) MCG/ACT inhaler Inhale 1-2 puffs into the lungs every 6 (six) hours as needed for wheezing or shortness of breath.    Marland Kitchen aspirin EC 325 MG  tablet Take 1 tablet (325 mg total) by mouth daily. 30 tablet 0  . calcium carbonate (OS-CAL - DOSED IN MG OF ELEMENTAL CALCIUM) 1250 MG tablet Take 1 tablet by mouth daily.      . cetirizine (ZYRTEC) 10 MG tablet Take 10 mg by mouth daily.     . cyclobenzaprine (FLEXERIL) 10 MG tablet Take 10 mg by mouth 3 (three) times daily as needed for muscle spasms.   0  . hydrOXYzine (ATARAX/VISTARIL) 10 MG tablet Take 1 tablet (10 mg total) by mouth every 4 (four) hours as needed for anxiety. 30 tablet 0  . ibuprofen (ADVIL) 200 MG tablet Take 400 mg by mouth every 6 (six) hours as needed for moderate pain.    Marland Kitchen lenalidomide (REVLIMID) 15 MG capsule Take 1 capsule (15 mg total) by mouth daily. Fanny Dance #[id number]     Date Obtained 04/08/2020 14 capsule 6  . losartan-hydrochlorothiazide (HYZAAR) 100-25 MG per tablet Take 1 tablet by mouth daily.     . methylPREDNISolone (MEDROL) 4 MG tablet Take 1 tablet (4 mg total) by mouth daily. Taper 6,5,4,3,2,1 21 tablet 0  . Multiple Vitamins-Minerals (MULTIVITAMIN PO) Take 1 tablet by mouth daily.     . ondansetron (ZOFRAN) 4 MG  tablet Take 1 tablet (4 mg total) by mouth every 6 (six) hours. 30 tablet 0  . oxyCODONE (OXY IR/ROXICODONE) 5 MG immediate release tablet Take 1-2 tablets (5-10 mg total) by mouth every 6 (six) hours as needed for severe pain. 60 tablet 0  . potassium chloride (KLOR-CON) 10 MEQ tablet Take 2 tablets (20 mEq total) by mouth 2 (two) times daily. 120 tablet 0  . prochlorperazine (COMPAZINE) 10 MG tablet Take 1 tablet (10 mg total) by mouth every 6 (six) hours as needed (Nausea or vomiting). 30 tablet 1  . promethazine (PHENERGAN) 25 MG tablet Take 1 tablet (25 mg total) by mouth every 6 (six) hours as needed for nausea. 12 tablet 0  . senna-docusate (SENOKOT-S) 8.6-50 MG tablet Take 1 tablet by mouth 2 (two) times daily. While taking pain meds to prevent constipation 30 tablet 0  . sertraline (ZOLOFT) 50 MG tablet Take 75 mg by mouth daily.      . traZODone (DESYREL) 50 MG tablet Take 50 mg by mouth at bedtime.     No current facility-administered medications for this visit.    OBJECTIVE: White woman examined in the treatment area  There were no vitals filed for this visit. Wt Readings from Last 3 Encounters:  04/23/20 129 lb (58.5 kg)  04/15/20 126 lb 11.2 oz (57.5 kg)  04/08/20 127 lb 1.6 oz (57.7 kg)   There is no height or weight on file to calculate BMI.    ECOG FS:1 - Symptomatic but completely ambulatory  The rash has completely cleared.  Skin rash 04/23/2020      LAB RESULTS:  CMP     Component Value Date/Time   NA 141 04/15/2020 1504   K 3.1 (L) 04/15/2020 1504   CL 103 04/15/2020 1504   CO2 28 04/15/2020 1504   GLUCOSE 90 04/15/2020 1504   BUN 19 04/15/2020 1504   CREATININE 0.74 04/15/2020 1504   CALCIUM 10.0 04/15/2020 1504   PROT 6.3 (L) 04/15/2020 1504   ALBUMIN 4.0 04/15/2020 1504   AST 13 (L) 04/15/2020 1504   ALT 14 04/15/2020 1504   ALKPHOS 82 04/15/2020 1504   BILITOT 0.4 04/15/2020 1504   GFRNONAA >60 04/15/2020 1504   GFRAA >60 04/15/2020 1504    Lab Results  Component Value Date   TOTALPROTELP 5.7 (L) 04/15/2020     Lab Results  Component Value Date   KPAFRELGTCHN 1,244.5 (H) 04/15/2020   LAMBDASER 3.4 (L) 04/15/2020   KAPLAMBRATIO 6,619.69 (H) 04/23/2020    Lab Results  Component Value Date   WBC 2.6 (L) 04/23/2020   NEUTROABS 1.2 (L) 04/23/2020   HGB 7.9 (L) 04/23/2020   HCT 23.5 (L) 04/23/2020   MCV 99.2 04/23/2020   PLT 86 (L) 04/23/2020    No results found for: LABCA2  No components found for: GKBOQU675  No results for input(s): INR in the last 168 hours.  No results found for: LABCA2  No results found for: VTW242  No results found for: RTQ069  No results found for: NPM722  No results found for: CA2729  No components found for: HGQUANT  No results found for: CEA1 / No results found for: CEA1   No results found for: AFPTUMOR  No results  found for: CHROMOGRNA  No results found for: PSA1  No visits with results within 3 Day(s) from this visit.  Latest known visit with results is:  Office Visit on 04/23/2020  Component Date Value Ref Range Status  . Order Confirmation  04/23/2020    Final                   Value:ORDER PROCESSED BY BLOOD BANK Performed at [facility] [address], [place], [place] [address]     (this displays the last labs from the last 3 days)  Lab Results  Component Value Date   TOTALPROTELP 5.7 (L) 04/15/2020   (this displays SPEP labs)  Lab Results  Component Value Date   KPAFRELGTCHN 1,244.5 (H) 04/15/2020   LAMBDASER 3.4 (L) 04/15/2020   KAPLAMBRATIO 9,373.42 (H) 04/23/2020   (kappa/lambda light chains)  No results found for: HGBA, HGBA2QUANT, HGBFQUANT, HGBSQUAN (Hemoglobinopathy evaluation)   Lab Results  Component Value Date   LDH 240 (H) 04/08/2020    No results found for: IRON, TIBC, IRONPCTSAT (Iron and TIBC)  No results found for: FERRITIN  Urinalysis    Component Value Date/Time   COLORURINE YELLOW 09/15/2015 2315   APPEARANCEUR CLOUDY (A) 09/15/2015 2315   LABSPEC 1.014 09/15/2015 2315   PHURINE 8.5 (H) 09/15/2015 2315   GLUCOSEU NEGATIVE 09/15/2015 2315   HGBUR SMALL (A) 09/15/2015 2315   BILIRUBINUR NEGATIVE 09/15/2015 2315   KETONESUR 15 (A) 09/15/2015 2315   PROTEINUR NEGATIVE 09/15/2015 2315   UROBILINOGEN 1.0 05/24/2013 1913   NITRITE NEGATIVE 09/15/2015 2315   LEUKOCYTESUR TRACE (A) 09/15/2015 2315     STUDIES:  DG Thoracic Spine 2 View  Result Date: 04/15/2020 CLINICAL DATA:  Back pain. EXAM: THORACIC SPINE 2 VIEWS COMPARISON:  None. FINDINGS: A chronic compression fracture deformity is seen involving the T7 vertebral body. Alignment is normal. No other significant bone abnormalities are identified. IMPRESSION: Chronic compression fracture deformity of the T7 vertebral body. Electronically Signed   By: Virgina Norfolk M.D.    On: 04/15/2020 17:42     ELIGIBLE FOR AVAILABLE RESEARCH PROTOCOL: no   ASSESSMENT: 60 y.o. Robin Wilson woman presenting June 2021 with severe back pain, thoracic MRI 02/22/2020 suggestive of possible myeloma.  Work-up included:  (a) 03/05/2020 Myeloma panel shows no M protein but all immunoglobulins are decreased.  The lambda ratio is markedly abnormal at 131.34; review of the blood film is not diagnostic  (b) 24-hour urine: markedly elevated Kappa light chains at 9,656; urine kappa/lambda ratio 4180.24, and urine M spike of 239.  (c) bone survey on 03/16/2020 shows rounded lucencies throughout the skull which could be venous lakes  (d) bone marrow biopsy on 7/27 confirms multiple myeloma with 44% plasma cells on the aspirate by manual differential, and 70 to 80% on the biopsy by CD138 immunohistochemistry  (e) cytogenetics  03/24/2020: no metaphase cells available for analysis  (f) myeloma FISH studies 03/24/2020 show t(11,14), with NO del(17p), t(1/14) or t(14.16)  (g) on 04/08/2020 LDH 240, beta-2-microglobulin 4.4, albumin 4.2  (h) R-ISS Stage: II   (1) bortezomib/dexamethasone started 03/31/2020  (a) Lenalidomide added on 04/21/2020, stopped on 8/26 after two doses secondary to rash, resumed 04/29/2020   PLAN: Della is receiving her fifth weekly dose of bortezomib/dexamethasone (low-dose) today.  She is tolerating it very well.  We had wanted to start lenalidomide but after some initial difficulty obtaining the drug for her she developed a rash shortly after starting.  There were other possible reasons for the rash and it did clear remarkably quickly on discontinuation of the drug (and also while on a Medrol Dosepak.  At this point I think it is worthwhile giving lenalidomide a second shot.  She will start it today.  If she develops a rash again she will immediately call us and stop the medication and we will start her on a different third agent.  We are going to refer her  to Dr. Cherylann Ratel at [facility] since we do not do transplants here.  He can help guide her pretransplant as well as her transplant treatment.  We are just initiating the consult at this point.  She will also is very interested in obtaining a second opinion for her treatment.  Today I gave her the results of her FISH and other studies which show her myeloma is stage II.  She will see my partner Dr. Lindi Adie next week, and I will catch up with her in the treatment area 05/13/2020.    She also met with Dr. Anselm Pancoast today in IR for consideration of kyphoplasty.  He felt the patient needed further evaluation with MRI of the thoracolumbar spine before a definite procedure could be decided on.  We will set those films up for her.  (I called the patient and left her voicemail letting her know we were putting in the orders).   Encounter time 20 minutes.Sarajane Jews C. Casilda Pickerill, MD 04/29/20 8:19 AM Medical Oncology and Hematology [facility] [place], [place] [address] Tel. [phone]    Fax. [phone]   I, Wilburn Mylar, am acting as scribe for Dr. Virgie Dad. Kaleeya Hancock.  I, Lurline Del MD, have reviewed the above documentation for accuracy and completeness, and I agree with the above.    *Total Encounter Time as defined by the Centers for Medicare and Medicaid Services includes, in addition to the face-to-face time of a patient visit (documented in the note above) non-face-to-face time: obtaining and reviewing outside history, ordering and reviewing medications, tests or procedures, care coordination (communications with other health care professionals or caregivers) and documentation in the medical record.

## 2020-04-29 ENCOUNTER — Other Ambulatory Visit: Payer: Self-pay | Admitting: Oncology

## 2020-04-29 ENCOUNTER — Telehealth: Payer: Self-pay | Admitting: *Deleted

## 2020-04-29 ENCOUNTER — Inpatient Hospital Stay (HOSPITAL_BASED_OUTPATIENT_CLINIC_OR_DEPARTMENT_OTHER): Payer: BC Managed Care – PPO | Admitting: Oncology

## 2020-04-29 ENCOUNTER — Other Ambulatory Visit: Payer: Self-pay | Admitting: *Deleted

## 2020-04-29 ENCOUNTER — Inpatient Hospital Stay: Payer: BC Managed Care – PPO

## 2020-04-29 ENCOUNTER — Ambulatory Visit
Admission: RE | Admit: 2020-04-29 | Discharge: 2020-04-29 | Disposition: A | Discharge status: Home / Self Care | Payer: BC Managed Care – PPO | Source: Ambulatory Visit | Attending: Adult Health | Admitting: Adult Health

## 2020-04-29 ENCOUNTER — Ambulatory Visit: Payer: BC Managed Care – PPO

## 2020-04-29 ENCOUNTER — Inpatient Hospital Stay: Payer: BC Managed Care – PPO | Attending: Oncology

## 2020-04-29 ENCOUNTER — Other Ambulatory Visit: Payer: Self-pay

## 2020-04-29 ENCOUNTER — Other Ambulatory Visit: Payer: BC Managed Care – PPO

## 2020-04-29 VITALS — BP 137/89 | HR 80 | Temp 98.5°F | Resp 16 | Wt 129.0 lb

## 2020-04-29 DIAGNOSIS — Z5112 Encounter for antineoplastic immunotherapy: Secondary | ICD-10-CM | POA: Insufficient documentation

## 2020-04-29 DIAGNOSIS — C9000 Multiple myeloma not having achieved remission: Secondary | ICD-10-CM | POA: Diagnosis not present

## 2020-04-29 DIAGNOSIS — S22060A Wedge compression fracture of T7-T8 vertebra, initial encounter for closed fracture: Secondary | ICD-10-CM | POA: Diagnosis not present

## 2020-04-29 HISTORY — PX: IR RADIOLOGIST EVAL & MGMT: IMG5224

## 2020-04-29 LAB — CBC WITH DIFFERENTIAL/PLATELET
Abs Immature Granulocytes: 0.01 10*3/uL (ref 0.00–0.07)
Basophils Absolute: 0 10*3/uL (ref 0.0–0.1)
Basophils Relative: 0 %
Eosinophils Absolute: 0 10*3/uL (ref 0.0–0.5)
Eosinophils Relative: 0 %
HCT: 34.7 % — ABNORMAL LOW (ref 36.0–46.0)
Hemoglobin: 11.4 g/dL — ABNORMAL LOW (ref 12.0–15.0)
Immature Granulocytes: 0 %
Lymphocytes Relative: 38 %
Lymphs Abs: 1.3 10*3/uL (ref 0.7–4.0)
MCH: 31.7 pg (ref 26.0–34.0)
MCHC: 32.9 g/dL (ref 30.0–36.0)
MCV: 96.4 fL (ref 80.0–100.0)
Monocytes Absolute: 0.2 10*3/uL (ref 0.1–1.0)
Monocytes Relative: 6 %
Neutro Abs: 1.9 10*3/uL (ref 1.7–7.7)
Neutrophils Relative %: 56 %
Platelets: 89 10*3/uL — ABNORMAL LOW (ref 150–400)
RBC: 3.6 MIL/uL — ABNORMAL LOW (ref 3.87–5.11)
RDW: 19.1 % — ABNORMAL HIGH (ref 11.5–15.5)
WBC: 3.3 10*3/uL — ABNORMAL LOW (ref 4.0–10.5)
nRBC: 0.9 % — ABNORMAL HIGH (ref 0.0–0.2)

## 2020-04-29 LAB — COMPREHENSIVE METABOLIC PANEL
ALT: 49 U/L — ABNORMAL HIGH (ref 0–44)
AST: 12 U/L — ABNORMAL LOW (ref 15–41)
Albumin: 3.9 g/dL (ref 3.5–5.0)
Alkaline Phosphatase: 87 U/L (ref 38–126)
Anion gap: 8 (ref 5–15)
BUN: 18 mg/dL (ref 6–20)
CO2: 33 mmol/L — ABNORMAL HIGH (ref 22–32)
Calcium: 10.5 mg/dL — ABNORMAL HIGH (ref 8.9–10.3)
Chloride: 100 mmol/L (ref 98–111)
Creatinine, Ser: 0.69 mg/dL (ref 0.44–1.00)
GFR calc Af Amer: >60 mL/min (ref >60)
GFR calc non Af Amer: >60 mL/min (ref >60)
Glucose, Bld: 82 mg/dL (ref 70–99)
Potassium: 3.8 mmol/L (ref 3.5–5.1)
Sodium: 141 mmol/L (ref 135–145)
Total Bilirubin: 0.5 mg/dL (ref 0.3–1.2)
Total Protein: 6.4 g/dL — ABNORMAL LOW (ref 6.5–8.1)

## 2020-04-29 MED ORDER — DEXAMETHASONE 4 MG PO TABS
ORAL_TABLET | ORAL | Status: AC
  Filled 2020-04-29: qty 10

## 2020-04-29 MED ORDER — PROCHLORPERAZINE MALEATE 10 MG PO TABS
10.0000 mg | ORAL_TABLET | Freq: Once | ORAL | Status: AC
  Administered 2020-04-29: 10 mg via ORAL

## 2020-04-29 MED ORDER — PROCHLORPERAZINE MALEATE 10 MG PO TABS
ORAL_TABLET | ORAL | Status: AC
  Filled 2020-04-29: qty 1

## 2020-04-29 MED ORDER — BORTEZOMIB CHEMO SQ INJECTION 3.5 MG (2.5MG/ML)
1.3000 mg/m2 | Freq: Once | INTRAMUSCULAR | Status: AC
  Administered 2020-04-29: 2.25 mg via SUBCUTANEOUS
  Filled 2020-04-29: qty 0.9

## 2020-04-29 MED ORDER — DEXAMETHASONE 4 MG PO TABS
40.0000 mg | ORAL_TABLET | Freq: Once | ORAL | Status: AC
  Administered 2020-04-29: 40 mg via ORAL

## 2020-04-29 NOTE — Patient Instructions (Signed)
[facility] Discharge Instructions for Patients Receiving Chemotherapy  Today you received the following chemotherapy agents: bortezomib.  To help prevent nausea and vomiting after your treatment, we encourage you to take your nausea medication as directed.   If you develop nausea and vomiting that is not controlled by your nausea medication, call the clinic.   BELOW ARE SYMPTOMS THAT SHOULD BE REPORTED IMMEDIATELY:  *FEVER GREATER THAN 100.5 F  *CHILLS WITH OR WITHOUT FEVER  NAUSEA AND VOMITING THAT IS NOT CONTROLLED WITH YOUR NAUSEA MEDICATION  *UNUSUAL SHORTNESS OF BREATH  *UNUSUAL BRUISING OR BLEEDING  TENDERNESS IN MOUTH AND THROAT WITH OR WITHOUT PRESENCE OF ULCERS  *URINARY PROBLEMS  *BOWEL PROBLEMS  UNUSUAL RASH Items with * indicate a potential emergency and should be followed up as soon as possible.  Feel free to call the clinic should you have any questions or concerns. The clinic phone number is [phone].  Please show the CHEMO ALERT CARD at check-in to the Emergency Department and triage nurse.   

## 2020-04-29 NOTE — Telephone Encounter (Signed)
Ok to treat with platelets of 89,000 per MD.

## 2020-04-29 NOTE — Consult Note (Signed)
Chief Complaint: Patient was seen in consultation today for kyphoplasty and biopsy at the request of [facility]  Referring Physician(s): Causey,Lindsey Cornetto  History of Present Illness: Robin Wilson is a 59 y.o. female with diagnosis multiple myeloma and currently undergoing treatment.  Patient initially presented with back pain while she was moving boxes.  Patient was evaluated by Dr. Gladstone Lighter and found to have a compression deformity at T7 and an outside MRI demonstrated abnormal marrow signal suggestive for a neoplastic process.  Patient has undergone a bone marrow biopsy and diagnosed with myeloma.  Patient is currently undergoing treatment.  Patient has had persistent back pain ever since the initial episode.  The back pain is intermittent and multifocal.  Back pain is predominantly in the mid and lower back region but sometimes she has anterior chest symptoms.  She is trying to avoid pain medications but she is taking oxycodone once a day before she goes to bed.  Pain symptoms are worse at night and with certain activities.  No significant pain at rest.  At this point, she is hesitant to exercise or do any physical activity due to her concerns about the fracture.  She appears to be ambulating well without difficulty.  Past Medical History:  Diagnosis Date  . Hypertension   . Kidney stones   . Kidney stones     Past Surgical History:  Procedure Laterality Date  . CESAREAN SECTION    . CYSTOSCOPY WITH RETROGRADE PYELOGRAM, URETEROSCOPY AND STENT PLACEMENT Right 09/16/2015   Procedure: CYSTOSCOPY WITH RETROGRADE PYELOGRAM, URETEROSCOPY AND STENT PLACEMENT WITH HOLMIUM LASER ABLATION ;  Surgeon: Alexis Frock, MD;  Location: [facility];  Service: Urology;  Laterality: Right;  . CYSTOSCOPY/RETROGRADE/URETEROSCOPY  06/04/2012   Procedure: CYSTOSCOPY/RETROGRADE/URETEROSCOPY;  Surgeon: Ailene Rud, MD;  Location: [facility];  Service: Urology;  Laterality: Left;  .  UTERINE FIBROID EMBOLIZATION      Allergies: Sulfa drugs cross reactors  Medications: Prior to Admission medications   Medication Sig Start Date End Date Taking? Authorizing Provider  acyclovir (ZOVIRAX) 400 MG tablet Take 1 tablet (400 mg total) by mouth 2 (two) times daily. 03/26/20   Magrinat, Virgie Dad, MD  albuterol (PROVENTIL HFA;VENTOLIN HFA) 108 (90 Base) MCG/ACT inhaler Inhale 1-2 puffs into the lungs every 6 (six) hours as needed for wheezing or shortness of breath.    [provider]  aspirin EC 325 MG tablet Take 1 tablet (325 mg total) by mouth daily. 04/08/20   Magrinat, Virgie Dad, MD  calcium carbonate (OS-CAL - DOSED IN MG OF ELEMENTAL CALCIUM) 1250 MG tablet Take 1 tablet by mouth daily.      [provider]  cetirizine (ZYRTEC) 10 MG tablet Take 10 mg by mouth daily.     [provider]  cyclobenzaprine (FLEXERIL) 10 MG tablet Take 10 mg by mouth 3 (three) times daily as needed for muscle spasms.  09/11/15   [provider]  hydrOXYzine (ATARAX/VISTARIL) 10 MG tablet Take 1 tablet (10 mg total) by mouth every 4 (four) hours as needed for anxiety. 05/24/13   Pisciotta, Elmyra Ricks, PA-C  ibuprofen (ADVIL) 200 MG tablet Take 400 mg by mouth every 6 (six) hours as needed for moderate pain.    [provider]  lenalidomide (REVLIMID) 15 MG capsule Take 1 capsule (15 mg total) by mouth daily. Fanny Dance #[id number]     Date Obtained 04/08/2020 04/09/20   Magrinat, Virgie Dad, MD  losartan-hydrochlorothiazide (HYZAAR) 100-25 MG per tablet Take 1  tablet by mouth daily.     [provider]  methylPREDNISolone (MEDROL) 4 MG tablet Take 1 tablet (4 mg total) by mouth daily. Taper 6,5,4,3,2,1 04/23/20   Gardenia Phlegm, NP  Multiple Vitamins-Minerals (MULTIVITAMIN PO) Take 1 tablet by mouth daily.     [provider]  ondansetron (ZOFRAN) 4 MG tablet Take 1 tablet (4 mg total) by mouth every 6 (six) hours. 09/13/15   Garnavillo Lions, PA-C  oxyCODONE (OXY IR/ROXICODONE) 5 MG immediate release tablet Take 1-2 tablets (5-10 mg total) by mouth every 6 (six) hours as needed for severe pain. 04/08/20   Causey, Charlestine Massed, NP  potassium chloride (KLOR-CON) 10 MEQ tablet Take 2 tablets (20 mEq total) by mouth 2 (two) times daily. 04/08/20   Gardenia Phlegm, NP  prochlorperazine (COMPAZINE) 10 MG tablet Take 1 tablet (10 mg total) by mouth every 6 (six) hours as needed (Nausea or vomiting). 03/26/20   Magrinat, Virgie Dad, MD  promethazine (PHENERGAN) 25 MG tablet Take 1 tablet (25 mg total) by mouth every 6 (six) hours as needed for nausea. 05/24/13   Pisciotta, Elmyra Ricks, PA-C  senna-docusate (SENOKOT-S) 8.6-50 MG tablet Take 1 tablet by mouth 2 (two) times daily. While taking pain meds to prevent constipation 09/16/15   Alexis Frock, MD  sertraline (ZOLOFT) 50 MG tablet Take 75 mg by mouth daily. 01/28/20   [provider]  traZODone (DESYREL) 50 MG tablet Take 50 mg by mouth at bedtime. 03/08/20   [provider]     No family history on file.  Social History   Socioeconomic History  . Marital status: Married    Spouse name: Not on file  . Number of children: Not on file  . Years of education: Not on file  . Highest education level: Not on file  Occupational History  . Not on file  Tobacco Use  . Smoking status: Never Smoker  . Smokeless tobacco: Never Used  Substance and Sexual Activity  . Alcohol use: No  . Drug use: No  . Sexual activity: Never  Other Topics Concern  . Not on file  Social History Narrative  . Not on file   Social Determinants of Health   Financial Resource Strain:   . Difficulty of Paying Living Expenses: Not on file  Food Insecurity:   . Worried About Charity fundraiser in the Last Year: Not on file  . Ran Out of Food in the Last Year: Not on file  Transportation Needs:   . Lack of Transportation (Medical): Not on file  . Lack of Transportation  (Non-Medical): Not on file  Physical Activity:   . Days of Exercise per Week: Not on file  . Minutes of Exercise per Session: Not on file  Stress:   . Feeling of Stress : Not on file  Social Connections:   . Frequency of Communication with Friends and Family: Not on file  . Frequency of Social Gatherings with Friends and Family: Not on file  . Attends Religious Services: Not on file  . Active Member of Clubs or Organizations: Not on file  . Attends Archivist Meetings: Not on file  . Marital Status: Not on file     Review of Systems  Musculoskeletal: Positive for back pain.    Vital Signs: There were no vitals taken for this visit.  Physical Exam Constitutional:      Appearance: Normal appearance.  Abdominal:     General: Abdomen is  flat.     Palpations: Abdomen is soft.  Musculoskeletal:     Comments: Mild tenderness with palpation in the thoracic and thoracolumbar spine.  Point tenderness in the right lower posterior ribs. Patient had left lower back with flexion and extension during the examination.  Neurological:     Mental Status: She is alert.        Imaging: DG Thoracic Spine 2 View  Result Date: 04/15/2020 CLINICAL DATA:  Back pain. EXAM: THORACIC SPINE 2 VIEWS COMPARISON:  None. FINDINGS: A chronic compression fracture deformity is seen involving the T7 vertebral body. Alignment is normal. No other significant bone abnormalities are identified. IMPRESSION: Chronic compression fracture deformity of the T7 vertebral body. Electronically Signed   By: Virgina Norfolk M.D.   On: 04/15/2020 17:42    Labs:  CBC: Recent Labs    04/08/20 0917 04/15/20 1504 04/23/20 0906 04/29/20 0818  WBC 4.2 3.7* 2.6* 3.3*  HGB 8.9* 8.2* 7.9* 11.4*  HCT 26.3* 24.6* 23.5* 34.7*  PLT 133* 112* 86* 89*    COAGS: No results for input(s): INR, APTT in the last 8760 hours.  BMP: Recent Labs    03/31/20 0820 04/08/20 0917 04/15/20 1504 04/29/20 0818  NA 141  141 141 141  K 3.1* 3.2* 3.1* 3.8  CL 102 101 103 100  CO2 29 28 28  33*  GLUCOSE 85 101* 90 82  BUN 16 14 19 18   CALCIUM 10.2 10.4* 10.0 10.5*  CREATININE 0.75 0.75 0.74 0.69  GFRNONAA >60 >60 >60 >60  GFRAA >60 >60 >60 >60    LIVER FUNCTION TESTS: Recent Labs    03/31/20 0820 04/08/20 0917 04/15/20 1504 04/29/20 0818  BILITOT 0.5 0.5 0.4 0.5  AST 14* 12* 13* 12*  ALT 11 11 14  49*  ALKPHOS 94 89 82 87  PROT 6.7 6.9 6.3* 6.4*  ALBUMIN 4.2 4.2 4.0 3.9    TUMOR MARKERS: No results for input(s): AFPTM, CEA, CA199, CHROMGRNA in the last 8760 hours.  Assessment and Plan:  59 year old with multiple myeloma and persistent back pain.  Patient has known compression fracture at T7.  I reviewed outside MRI from 02/22/2020, bone survey from March 13, 2020 and thoracic spine radiograph on 04/15/2020.  Compression fracture at T7 may have progressed between June and August.  Markedly abnormal marrow signal throughout the spine particularly at the T7 vertebral body.  On examination, it is difficult to localize the area of pain.  Patient has mild tenderness in the region of T7 but also has tenderness elsewhere in the thoracolumbar regions.  Patient admits that the pain location is not consistent and sometimes her discomfort is in the lower lumbar spine region.  We discussed possible treatments for compression fractures including kyphoplasty with biopsy and tumor ablation with vertebral body augmentation.  At this time, I cannot confidently say about the T7 vertebral body is causing the patient's symptoms.  Patient has a compression fracture at T7 but I am concerned that she may have additional levels that are causing her pain.  I recommend repeat MRI of the thoracic and lumbar spine, with and without contrast.  Depending on the results of the study, we can decide whether or not vertebral body augmentation would be beneficial to treat her back pain.  We will schedule the MRI.  Thank you for this  interesting consult.  I greatly enjoyed meeting Robin Wilson and look forward to participating in their care.  A copy of this report was sent to the  requesting provider on this date.  Electronically Signed: Burman Riis 04/29/2020, 2:53 PM   I spent a total of  15 Minutes  in face to face in clinical consultation, greater than 50% of which was counseling/coordinating care for myeloma and back pain.

## 2020-04-29 NOTE — Progress Notes (Signed)
Robin Wilson's calcium is borderline high. I am starting her on denosumab monthly.

## 2020-04-30 ENCOUNTER — Telehealth: Payer: Self-pay | Admitting: Oncology

## 2020-04-30 ENCOUNTER — Other Ambulatory Visit: Payer: Self-pay

## 2020-04-30 NOTE — Telephone Encounter (Signed)
Scheduled appts per 9/2 los and staff msg. Pt to get updated appt calendar at next visit per appt notes.

## 2020-04-30 NOTE — Progress Notes (Signed)
Referral faxed to Dr. Cherylann Ratel [facility] for 2nd opinion.

## 2020-05-01 ENCOUNTER — Other Ambulatory Visit: Payer: Self-pay | Admitting: *Deleted

## 2020-05-01 DIAGNOSIS — C9000 Multiple myeloma not having achieved remission: Secondary | ICD-10-CM

## 2020-05-04 ENCOUNTER — Other Ambulatory Visit: Payer: Self-pay | Admitting: Oncology

## 2020-05-04 DIAGNOSIS — C9000 Multiple myeloma not having achieved remission: Secondary | ICD-10-CM

## 2020-05-05 NOTE — Progress Notes (Signed)
Patient Care Team: Maurice Small, MD as PCP - General (Family Medicine) Magrinat, Virgie Dad, MD as Consulting Physician (Oncology) Latanya Maudlin, MD as Consulting Physician (Orthopedic Surgery) Everlene Farrier, MD as Consulting Physician (Obstetrics and Gynecology) Domingo Pulse, MD (Urology) Ronald Lobo, MD as Consulting Physician (Gastroenterology) Markus Daft, MD as Consulting Physician (Interventional Radiology)  DIAGNOSIS:    ICD-10-CM   1. Multiple myeloma not having achieved remission ([place])  C90.00 MR TOTAL SPINE W WO CONTRAST    oxyCODONE (OXY IR/ROXICODONE) 5 MG immediate release tablet    SUMMARY OF ONCOLOGIC HISTORY: Oncology History  Multiple myeloma ([employer])  03/24/2020 Initial Diagnosis   Multiple myeloma ([place])   03/31/2020 -  Chemotherapy   The patient had dexamethasone (DECADRON) tablet 40 mg, 40 mg (100 % of original dose 40 mg), Oral,  Once, 5 of 8 cycles Dose modification: 40 mg (original dose 40 mg, Cycle 1) Administration: 40 mg (03/31/2020), 40 mg (04/08/2020), 40 mg (04/15/2020), 40 mg (04/23/2020), 40 mg (04/29/2020) dexamethasone (DECADRON) 4 MG tablet, 20 mg, Oral, Daily, 1 of 1 cycle, Start date: --, End date: -- lenalidomide (REVLIMID) 25 MG capsule, 1 of 1 cycle, Start date: --, End date: -- bortezomib SQ (VELCADE) chemo injection 2.25 mg, 1.3 mg/m2 = 2.25 mg, Subcutaneous,  Once, 5 of 8 cycles Administration: 2.25 mg (03/31/2020), 2.25 mg (04/08/2020), 2.25 mg (04/15/2020), 2.25 mg (04/23/2020), 2.25 mg (04/29/2020)  for chemotherapy treatment.      CHIEF COMPLIANT: Follow-up of multiple myeloma  INTERVAL HISTORY: Robin Wilson is a 59 y.o. with above-mentioned history of multiple myeloma currently on treatment with Bortezomib, Lenalidomide, and Dexamethasone weekly. She is a patient of Dr. Virgie Dad and I am seeing her in his absence. She presents to the clinic today for treatment.  She is complaining of intractable low back spasms as well as mid back pain.  She  ran out of her pain medication as well as muscle relaxants.  She is still awaiting a call from [facility] regarding an appointment to see them.  Last week she resumed lenalidomide and today she is here for a follow-up.  The rash once again reappeared but it was not as bad as it was the first time.  She discontinued it at this time.  ALLERGIES:  is allergic to sulfa drugs cross reactors.  MEDICATIONS:  Current Outpatient Medications  Medication Sig Dispense Refill  . acyclovir (ZOVIRAX) 400 MG tablet Take 1 tablet (400 mg total) by mouth 2 (two) times daily. 60 tablet 3  . albuterol (PROVENTIL HFA;VENTOLIN HFA) 108 (90 Base) MCG/ACT inhaler Inhale 1-2 puffs into the lungs every 6 (six) hours as needed for wheezing or shortness of breath.    Marland Kitchen aspirin EC 325 MG tablet Take 1 tablet (325 mg total) by mouth daily. 30 tablet 0  . calcium carbonate (OS-CAL - DOSED IN MG OF ELEMENTAL CALCIUM) 1250 MG tablet Take 1 tablet by mouth daily.      . cetirizine (ZYRTEC) 10 MG tablet Take 10 mg by mouth daily.     . cyclobenzaprine (FLEXERIL) 10 MG tablet Take 1 tablet (10 mg total) by mouth 3 (three) times daily as needed for muscle spasms. 60 tablet 0  . hydrOXYzine (ATARAX/VISTARIL) 10 MG tablet Take 1 tablet (10 mg total) by mouth every 4 (four) hours as needed for anxiety. 30 tablet 0  . ibuprofen (ADVIL) 200 MG tablet Take 400 mg by mouth every 6 (six) hours as needed for moderate pain.    Marland Kitchen  losartan-hydrochlorothiazide (HYZAAR) 100-25 MG per tablet Take 1 tablet by mouth daily.     . methylPREDNISolone (MEDROL) 4 MG tablet Take 1 tablet (4 mg total) by mouth daily. Taper 6,5,4,3,2,1 21 tablet 0  . Multiple Vitamins-Minerals (MULTIVITAMIN PO) Take 1 tablet by mouth daily.     . ondansetron (ZOFRAN) 4 MG tablet Take 1 tablet (4 mg total) by mouth every 6 (six) hours. 30 tablet 0  . oxyCODONE (OXY IR/ROXICODONE) 5 MG immediate release tablet Take 1-2 tablets (5-10 mg total) by mouth every 6  (six) hours as needed for severe pain. 60 tablet 0  . potassium chloride (KLOR-CON) 10 MEQ tablet Take 2 tablets (20 mEq total) by mouth 2 (two) times daily. 120 tablet 0  . prochlorperazine (COMPAZINE) 10 MG tablet Take 1 tablet (10 mg total) by mouth every 6 (six) hours as needed (Nausea or vomiting). 30 tablet 1  . promethazine (PHENERGAN) 25 MG tablet Take 1 tablet (25 mg total) by mouth every 6 (six) hours as needed for nausea. 12 tablet 0  . senna-docusate (SENOKOT-S) 8.6-50 MG tablet Take 1 tablet by mouth 2 (two) times daily. While taking pain meds to prevent constipation 30 tablet 0  . sertraline (ZOLOFT) 50 MG tablet Take 75 mg by mouth daily.    . traZODone (DESYREL) 50 MG tablet Take 50 mg by mouth at bedtime.     No current facility-administered medications for this visit.    PHYSICAL EXAMINATION: ECOG PERFORMANCE STATUS: 2 - Symptomatic, <50% confined to bed  Vitals:   05/06/20 1241  BP: 118/75  Pulse: (!) 118  Resp: 18  Temp: 98.5 F (36.9 C)  SpO2: 97%   Filed Weights   05/06/20 1241  Weight: 129 lb 9.6 oz (58.8 kg)    LABORATORY DATA:  I have reviewed the data as listed CMP Latest Ref Rng & Units 05/06/2020 04/29/2020 04/15/2020  Glucose 70 - 99 mg/dL 110(H) 82 90  BUN 6 - 20 mg/dL _0 Creatinine 0.44 - 1.00 mg/dL 0.67 0.69 0.74  Sodium 135 - 145 mmol/L 139 141 141  Potassium 3.5 - 5.1 mmol/L 3.6 3.8 3.1(L)  Chloride 98 - 111 mmol/L 101 100 103  CO2 22 - 32 mmol/L 28 33(H) 28  Calcium 8.9 - 10.3 mg/dL 9.5 10.5(H) 10.0  Total Protein 6.5 - 8.1 g/dL 6.3(L) 6.4(L) 6.3(L)  Total Bilirubin 0.3 - 1.2 mg/dL 0.6 0.5 0.4  Alkaline Phos 38 - 126 U/L 98 87 82  AST 15 - 41 U/L 25 12(L) 13(L)  ALT 0 - 44 U/L 51(H) 49(H) 14    Lab Results  Component Value Date   WBC 4.6 05/06/2020   HGB 10.9 (L) 05/06/2020   HCT 32.5 (L) 05/06/2020   MCV 94.8 05/06/2020   PLT 83 (L) 05/06/2020   NEUTROABS 3.4 05/06/2020    ASSESSMENT & PLAN:  Multiple myeloma ([facility]) Light  chain multiple myeloma: Kappa 1244, ratio 366, no M protein 03/24/2020: Increased plasma cells 44% on aspirate and 70 to 80% on CD138 staining consistent with plasma cell myeloma  Current treatment: Bortezomib and dexamethasone Patient has been intolerant to lenalidomide because of profound rash  Back pain issues: I am worried about cord compression.  We will obtain an MRI for total spine. I renewed her prescription for oxycodone and muscle relaxant Flexeril.  I suspect that the patient may need a kyphoplasty on palliative radiation to alleviate her pain.   Instead of changing treatment to pomalidomide, we will follow  up on her appointment at [facility]. She will continue to hold off on lenalidomide.  Patient has an appointment to see Dr. Jana Hakim next week.     Orders Placed This Encounter  Procedures  . MR TOTAL SPINE W WO CONTRAST    Standing Status:   Future    Standing Expiration Date:   05/06/2021    Order Specific Question:   If indicated for the ordered procedure, I authorize the administration of contrast media per Radiology protocol    Answer:   Yes    Order Specific Question:   What is the patient's sedation requirement?    Answer:   No Sedation    Order Specific Question:   Does the patient have a pacemaker or implanted devices?    Answer:   No    Order Specific Question:   Radiology Contrast Protocol - do NOT remove file path    Answer:   [email or web address]    Order Specific Question:   Preferred imaging location?    Answer:   [facility] (table limit - 550 lbs)   The patient has a good understanding of the overall plan. she agrees with it. she will call with any problems that may develop before the next visit here.  Total time spent: 30 mins including face to face time and time spent for planning, charting and coordination of care  Nicholas Lose, MD 05/06/2020  I, Cloyde Reams Dorshimer, am acting as scribe for Dr. Nicholas Lose.  I have reviewed the above documentation for accuracy and completeness, and I agree with the above.

## 2020-05-06 ENCOUNTER — Other Ambulatory Visit: Payer: Self-pay

## 2020-05-06 ENCOUNTER — Inpatient Hospital Stay: Payer: BC Managed Care – PPO

## 2020-05-06 ENCOUNTER — Telehealth: Payer: Self-pay

## 2020-05-06 ENCOUNTER — Other Ambulatory Visit: Payer: Self-pay | Admitting: *Deleted

## 2020-05-06 ENCOUNTER — Inpatient Hospital Stay (HOSPITAL_BASED_OUTPATIENT_CLINIC_OR_DEPARTMENT_OTHER): Payer: BC Managed Care – PPO | Admitting: Hematology and Oncology

## 2020-05-06 ENCOUNTER — Ambulatory Visit (HOSPITAL_BASED_OUTPATIENT_CLINIC_OR_DEPARTMENT_OTHER): Payer: BC Managed Care – PPO | Admitting: Medical

## 2020-05-06 VITALS — BP 118/75 | HR 118 | Temp 98.5°F | Resp 18 | Ht 70.0 in | Wt 129.6 lb

## 2020-05-06 VITALS — HR 108

## 2020-05-06 DIAGNOSIS — C9000 Multiple myeloma not having achieved remission: Secondary | ICD-10-CM

## 2020-05-06 DIAGNOSIS — Z5112 Encounter for antineoplastic immunotherapy: Secondary | ICD-10-CM | POA: Diagnosis not present

## 2020-05-06 LAB — COMPREHENSIVE METABOLIC PANEL
ALT: 51 U/L — ABNORMAL HIGH (ref 0–44)
AST: 25 U/L (ref 15–41)
Albumin: 3.8 g/dL (ref 3.5–5.0)
Alkaline Phosphatase: 98 U/L (ref 38–126)
Anion gap: 10 (ref 5–15)
BUN: 12 mg/dL (ref 6–20)
CO2: 28 mmol/L (ref 22–32)
Calcium: 9.5 mg/dL (ref 8.9–10.3)
Chloride: 101 mmol/L (ref 98–111)
Creatinine, Ser: 0.67 mg/dL (ref 0.44–1.00)
GFR calc Af Amer: >60 mL/min (ref >60)
GFR calc non Af Amer: >60 mL/min (ref >60)
Glucose, Bld: 110 mg/dL — ABNORMAL HIGH (ref 70–99)
Potassium: 3.6 mmol/L (ref 3.5–5.1)
Sodium: 139 mmol/L (ref 135–145)
Total Bilirubin: 0.6 mg/dL (ref 0.3–1.2)
Total Protein: 6.3 g/dL — ABNORMAL LOW (ref 6.5–8.1)

## 2020-05-06 LAB — CBC WITH DIFFERENTIAL (CANCER CENTER ONLY)
Abs Immature Granulocytes: 0.03 10*3/uL (ref 0.00–0.07)
Basophils Absolute: 0 10*3/uL (ref 0.0–0.1)
Basophils Relative: 0 %
Eosinophils Absolute: 0.2 10*3/uL (ref 0.0–0.5)
Eosinophils Relative: 3 %
HCT: 32.5 % — ABNORMAL LOW (ref 36.0–46.0)
Hemoglobin: 10.9 g/dL — ABNORMAL LOW (ref 12.0–15.0)
Immature Granulocytes: 1 %
Lymphocytes Relative: 19 %
Lymphs Abs: 0.9 10*3/uL (ref 0.7–4.0)
MCH: 31.8 pg (ref 26.0–34.0)
MCHC: 33.5 g/dL (ref 30.0–36.0)
MCV: 94.8 fL (ref 80.0–100.0)
Monocytes Absolute: 0.1 10*3/uL (ref 0.1–1.0)
Monocytes Relative: 2 %
Neutro Abs: 3.4 10*3/uL (ref 1.7–7.7)
Neutrophils Relative %: 75 %
Platelet Count: 83 10*3/uL — ABNORMAL LOW (ref 150–400)
RBC: 3.43 MIL/uL — ABNORMAL LOW (ref 3.87–5.11)
RDW: 19.2 % — ABNORMAL HIGH (ref 11.5–15.5)
WBC Count: 4.6 10*3/uL (ref 4.0–10.5)
nRBC: 0 % (ref 0.0–0.2)

## 2020-05-06 MED ORDER — DEXAMETHASONE 4 MG PO TABS
40.0000 mg | ORAL_TABLET | Freq: Once | ORAL | Status: AC
  Administered 2020-05-06: 40 mg via ORAL

## 2020-05-06 MED ORDER — DEXAMETHASONE 4 MG PO TABS
ORAL_TABLET | ORAL | Status: AC
  Filled 2020-05-06: qty 10

## 2020-05-06 MED ORDER — DENOSUMAB 120 MG/1.7ML ~~LOC~~ SOLN
120.0000 mg | Freq: Once | SUBCUTANEOUS | Status: AC
  Administered 2020-05-06: 120 mg via SUBCUTANEOUS

## 2020-05-06 MED ORDER — DENOSUMAB 120 MG/1.7ML ~~LOC~~ SOLN
SUBCUTANEOUS | Status: AC
  Filled 2020-05-06: qty 1.7

## 2020-05-06 MED ORDER — PROCHLORPERAZINE MALEATE 10 MG PO TABS
ORAL_TABLET | ORAL | Status: AC
  Filled 2020-05-06: qty 1

## 2020-05-06 MED ORDER — PROCHLORPERAZINE MALEATE 10 MG PO TABS
10.0000 mg | ORAL_TABLET | Freq: Once | ORAL | Status: AC
  Administered 2020-05-06: 10 mg via ORAL

## 2020-05-06 MED ORDER — OXYCODONE HCL 5 MG PO TABS: 5.0000 mg | ORAL_TABLET | Freq: Four times a day (QID) | ORAL | 0 refills | Status: DC | PRN

## 2020-05-06 MED ORDER — CYCLOBENZAPRINE HCL 10 MG PO TABS: 10.0000 mg | ORAL_TABLET | Freq: Three times a day (TID) | ORAL | 0 refills | Status: DC | PRN

## 2020-05-06 MED ORDER — BORTEZOMIB CHEMO SQ INJECTION 3.5 MG (2.5MG/ML)
1.3000 mg/m2 | Freq: Once | INTRAMUSCULAR | Status: AC
  Administered 2020-05-06: 2.25 mg via SUBCUTANEOUS
  Filled 2020-05-06: qty 0.9

## 2020-05-06 NOTE — Assessment & Plan Note (Addendum)
Light chain multiple myeloma: Kappa 1244, ratio 366, no M protein 03/24/2020: Increased plasma cells 44% on aspirate and 70 to 80% on CD138 staining consistent with plasma cell myeloma  Current treatment: Bortezomib and dexamethasone Patient has been intolerant to lenalidomide because of profound rash  Back pain issues: I am worried about cord compression.  We will obtain an MRI for total spine. I renewed her prescription for oxycodone and muscle relaxant Flexeril.  I suspect that the patient may need a kyphoplasty on palliative radiation to alleviate her pain.   Instead of changing treatment to pomalidomide, we will follow up on her appointment at [facility]. She will continue to hold off on lenalidomide.  Patient has an appointment to see Dr. Jana Hakim next week.

## 2020-05-06 NOTE — Progress Notes (Signed)
Per Dr. Lindi Adie, okay to proceed with treatment with heart rate 108.

## 2020-05-06 NOTE — Telephone Encounter (Signed)
Called to Dr. Gaynelle Adu office to check on referral for patient and if they had scheduled an appt for her.  Per receptionist, Dr. Aris Lot is currently reviewing the chart.

## 2020-05-06 NOTE — Patient Instructions (Signed)
[facility] Discharge Instructions for Patients Receiving Chemotherapy  Today you received the following chemotherapy agents: bortezomib.  To help prevent nausea and vomiting after your treatment, we encourage you to take your nausea medication as directed.   If you develop nausea and vomiting that is not controlled by your nausea medication, call the clinic.   BELOW ARE SYMPTOMS THAT SHOULD BE REPORTED IMMEDIATELY:  *FEVER GREATER THAN 100.5 F  *CHILLS WITH OR WITHOUT FEVER  NAUSEA AND VOMITING THAT IS NOT CONTROLLED WITH YOUR NAUSEA MEDICATION  *UNUSUAL SHORTNESS OF BREATH  *UNUSUAL BRUISING OR BLEEDING  TENDERNESS IN MOUTH AND THROAT WITH OR WITHOUT PRESENCE OF ULCERS  *URINARY PROBLEMS  *BOWEL PROBLEMS  UNUSUAL RASH Items with * indicate a potential emergency and should be followed up as soon as possible.  Feel free to call the clinic should you have any questions or concerns. The clinic phone number is [phone].  Please show the CHEMO ALERT CARD at check-in to the Emergency Department and triage nurse.  Denosumab injection What is this medicine? DENOSUMAB (den oh sue mab) slows bone breakdown. Prolia is used to treat osteoporosis in women after menopause and in men, and in people who are taking corticosteroids for 6 months or more. Xgeva is used to treat a high calcium level due to cancer and to prevent bone fractures and other bone problems caused by multiple myeloma or cancer bone metastases. Xgeva is also used to treat giant cell tumor of the bone. This medicine may be used for other purposes; ask your health care provider or pharmacist if you have questions. COMMON BRAND NAME(S): Prolia, XGEVA What should I tell my health care provider before I take this medicine? They need to know if you have any of these conditions:  dental disease  having surgery or tooth extraction  infection  kidney disease  low levels of calcium or Vitamin D in the  blood  malnutrition  on hemodialysis  skin conditions or sensitivity  thyroid or parathyroid disease  an unusual reaction to denosumab, other medicines, foods, dyes, or preservatives  pregnant or trying to get pregnant  breast-feeding How should I use this medicine? This medicine is for injection under the skin. It is given by a health care professional in a hospital or clinic setting. A special MedGuide will be given to you before each treatment. Be sure to read this information carefully each time. For Prolia, talk to your pediatrician regarding the use of this medicine in children. Special care may be needed. For Xgeva, talk to your pediatrician regarding the use of this medicine in children. While this drug may be prescribed for children as young as 13 years for selected conditions, precautions do apply. Overdosage: If you think you have taken too much of this medicine contact a poison control center or emergency room at once. NOTE: This medicine is only for you. Do not share this medicine with others. What if I miss a dose? It is important not to miss your dose. Call your doctor or health care professional if you are unable to keep an appointment. What may interact with this medicine? Do not take this medicine with any of the following medications:  other medicines containing denosumab This medicine may also interact with the following medications:  medicines that lower your chance of fighting infection  steroid medicines like prednisone or cortisone This list may not describe all possible interactions. Give your health care provider a list of all the medicines, herbs, non-prescription   drugs, or dietary supplements you use. Also tell them if you smoke, drink alcohol, or use illegal drugs. Some items may interact with your medicine. What should I watch for while using this medicine? Visit your doctor or health care professional for regular checks on your progress. Your doctor or  health care professional may order blood tests and other tests to see how you are doing. Call your doctor or health care professional for advice if you get a fever, chills or sore throat, or other symptoms of a cold or flu. Do not treat yourself. This drug may decrease your body's ability to fight infection. Try to avoid being around people who are sick. You should make sure you get enough calcium and vitamin D while you are taking this medicine, unless your doctor tells you not to. Discuss the foods you eat and the vitamins you take with your health care professional. See your dentist regularly. Brush and floss your teeth as directed. Before you have any dental work done, tell your dentist you are receiving this medicine. Do not become pregnant while taking this medicine or for 5 months after stopping it. Talk with your doctor or health care professional about your birth control options while taking this medicine. Women should inform their doctor if they wish to become pregnant or think they might be pregnant. There is a potential for serious side effects to an unborn child. Talk to your health care professional or pharmacist for more information. What side effects may I notice from receiving this medicine? Side effects that you should report to your doctor or health care professional as soon as possible:  allergic reactions like skin rash, itching or hives, swelling of the face, lips, or tongue  bone pain  breathing problems  dizziness  jaw pain, especially after dental work  redness, blistering, peeling of the skin  signs and symptoms of infection like fever or chills; cough; sore throat; pain or trouble passing urine  signs of low calcium like fast heartbeat, muscle cramps or muscle pain; pain, tingling, numbness in the hands or feet; seizures  unusual bleeding or bruising  unusually weak or tired Side effects that usually do not require medical attention (report to your doctor or  health care professional if they continue or are bothersome):  constipation  diarrhea  headache  joint pain  loss of appetite  muscle pain  runny nose  tiredness  upset stomach This list may not describe all possible side effects. Call your doctor for medical advice about side effects. You may report side effects to FDA at 1-800-FDA-1088. Where should I keep my medicine? This medicine is only given in a clinic, doctor's office, or other health care setting and will not be stored at home. NOTE: This sheet is a summary. It may not cover all possible information. If you have questions about this medicine, talk to your doctor, pharmacist, or health care provider.  2020 [employer]vier/Gold Standard (2017-12-22 16:10:44)

## 2020-05-06 NOTE — Progress Notes (Signed)
Platelets today are 40, ok to treat per Dr. Lindi Adie

## 2020-05-06 NOTE — Telephone Encounter (Signed)
Spoke with patient at center.  MRI was approved and is scheduled for 05/07/20 at 7 am, arrival time is 6:30 am at [facility]. Informed pt that Dr. Aris Lot at [facility] is reviewing her chart and some one should be reaching out to her soon to make an appt.  Pt voiced understanding and thanks for letting her know.

## 2020-05-06 NOTE — Telephone Encounter (Signed)
error 

## 2020-05-07 ENCOUNTER — Ambulatory Visit (HOSPITAL_COMMUNITY)
Admission: RE | Admit: 2020-05-07 | Discharge: 2020-05-07 | Disposition: A | Discharge status: Home / Self Care | Payer: BC Managed Care – PPO | Source: Ambulatory Visit | Attending: Hematology and Oncology | Admitting: Hematology and Oncology

## 2020-05-07 ENCOUNTER — Telehealth: Payer: Self-pay | Admitting: Hematology and Oncology

## 2020-05-07 ENCOUNTER — Encounter: Payer: Self-pay | Admitting: Hematology and Oncology

## 2020-05-07 DIAGNOSIS — M47812 Spondylosis without myelopathy or radiculopathy, cervical region: Secondary | ICD-10-CM | POA: Diagnosis not present

## 2020-05-07 DIAGNOSIS — M5127 Other intervertebral disc displacement, lumbosacral region: Secondary | ICD-10-CM | POA: Diagnosis not present

## 2020-05-07 DIAGNOSIS — C9000 Multiple myeloma not having achieved remission: Secondary | ICD-10-CM | POA: Diagnosis not present

## 2020-05-07 DIAGNOSIS — M4802 Spinal stenosis, cervical region: Secondary | ICD-10-CM | POA: Diagnosis not present

## 2020-05-07 MED ORDER — GADOBUTROL 1 MMOL/ML IV SOLN
6.0000 mL | Freq: Once | INTRAVENOUS | Status: AC | PRN
  Administered 2020-05-07: 6 mL via INTRAVENOUS

## 2020-05-07 NOTE — Telephone Encounter (Signed)
No 9/8 los, no changes made to pt schedule

## 2020-05-08 ENCOUNTER — Telehealth: Payer: Self-pay | Admitting: Hematology and Oncology

## 2020-05-08 ENCOUNTER — Telehealth: Payer: Self-pay | Admitting: *Deleted

## 2020-05-08 ENCOUNTER — Other Ambulatory Visit: Payer: Self-pay | Admitting: *Deleted

## 2020-05-08 DIAGNOSIS — M5410 Radiculopathy, site unspecified: Secondary | ICD-10-CM

## 2020-05-08 DIAGNOSIS — C9000 Multiple myeloma not having achieved remission: Secondary | ICD-10-CM

## 2020-05-08 NOTE — Telephone Encounter (Signed)
Released records to [facility] for referral to [phone]     Release:  [id number]

## 2020-05-08 NOTE — Telephone Encounter (Signed)
This RN placed referral order in Epic per MD to Dr Erline Levine for evaluation for kyphoplasty.  Contacted his office and left a detailed message on VM of new patient coordinator per above.  This RN's name, direct return call number and fax given for communication per above.

## 2020-05-09 NOTE — Progress Notes (Signed)
Robin Wilson is a 59 year old female with a history of multiple myeloma who is managed by Dr. Britt Bolognese.  She was seen in the infusion room today prior to receiving Xgeva.  She has never yet had this medication and had questions regarding it before she signed her approval for treatment.  This medication was discussed with the patient and her questions were answered.  She elects to proceed with this treatment today.  Sandi Mealy, MHS, PA-C Physician Assistant

## 2020-05-12 ENCOUNTER — Other Ambulatory Visit: Payer: Self-pay | Admitting: Radiation Therapy

## 2020-05-13 ENCOUNTER — Other Ambulatory Visit: Payer: Self-pay

## 2020-05-13 ENCOUNTER — Telehealth: Payer: Self-pay | Admitting: *Deleted

## 2020-05-13 ENCOUNTER — Inpatient Hospital Stay (HOSPITAL_BASED_OUTPATIENT_CLINIC_OR_DEPARTMENT_OTHER): Payer: BC Managed Care – PPO | Admitting: Oncology

## 2020-05-13 ENCOUNTER — Inpatient Hospital Stay: Payer: BC Managed Care – PPO

## 2020-05-13 VITALS — BP 114/65 | HR 63 | Temp 97.7°F | Resp 18 | Ht 70.0 in | Wt 124.2 lb

## 2020-05-13 DIAGNOSIS — C9000 Multiple myeloma not having achieved remission: Secondary | ICD-10-CM | POA: Diagnosis not present

## 2020-05-13 DIAGNOSIS — Z5112 Encounter for antineoplastic immunotherapy: Secondary | ICD-10-CM | POA: Diagnosis not present

## 2020-05-13 LAB — COMPREHENSIVE METABOLIC PANEL
ALT: 23 U/L (ref 0–44)
AST: 15 U/L (ref 15–41)
Albumin: 3.8 g/dL (ref 3.5–5.0)
Alkaline Phosphatase: 103 U/L (ref 38–126)
Anion gap: 8 (ref 5–15)
BUN: 22 mg/dL — ABNORMAL HIGH (ref 6–20)
CO2: 31 mmol/L (ref 22–32)
Calcium: 9.1 mg/dL (ref 8.9–10.3)
Chloride: 100 mmol/L (ref 98–111)
Creatinine, Ser: 0.76 mg/dL (ref 0.44–1.00)
GFR calc Af Amer: >60 mL/min (ref >60)
GFR calc non Af Amer: >60 mL/min (ref >60)
Glucose, Bld: 90 mg/dL (ref 70–99)
Potassium: 3.1 mmol/L — ABNORMAL LOW (ref 3.5–5.1)
Sodium: 139 mmol/L (ref 135–145)
Total Bilirubin: 0.6 mg/dL (ref 0.3–1.2)
Total Protein: 6.8 g/dL (ref 6.5–8.1)

## 2020-05-13 LAB — CBC WITH DIFFERENTIAL (CANCER CENTER ONLY)
Abs Immature Granulocytes: 0.01 10*3/uL (ref 0.00–0.07)
Basophils Absolute: 0 10*3/uL (ref 0.0–0.1)
Basophils Relative: 1 %
Eosinophils Absolute: 0.1 10*3/uL (ref 0.0–0.5)
Eosinophils Relative: 3 %
HCT: 34.2 % — ABNORMAL LOW (ref 36.0–46.0)
Hemoglobin: 11.4 g/dL — ABNORMAL LOW (ref 12.0–15.0)
Immature Granulocytes: 0 %
Lymphocytes Relative: 44 %
Lymphs Abs: 1.3 10*3/uL (ref 0.7–4.0)
MCH: 31.1 pg (ref 26.0–34.0)
MCHC: 33.3 g/dL (ref 30.0–36.0)
MCV: 93.4 fL (ref 80.0–100.0)
Monocytes Absolute: 0.2 10*3/uL (ref 0.1–1.0)
Monocytes Relative: 5 %
Neutro Abs: 1.4 10*3/uL — ABNORMAL LOW (ref 1.7–7.7)
Neutrophils Relative %: 47 %
Platelet Count: 129 10*3/uL — ABNORMAL LOW (ref 150–400)
RBC: 3.66 MIL/uL — ABNORMAL LOW (ref 3.87–5.11)
RDW: 18.6 % — ABNORMAL HIGH (ref 11.5–15.5)
WBC Count: 3.1 10*3/uL — ABNORMAL LOW (ref 4.0–10.5)
nRBC: 0 % (ref 0.0–0.2)

## 2020-05-13 LAB — PROTEIN / CREATININE RATIO, URINE
Creatinine, Urine: 178.33 mg/dL
Protein Creatinine Ratio: 0.43 mg/mg{Cre} — ABNORMAL HIGH (ref 0.00–0.15)
Total Protein, Urine: 76 mg/dL

## 2020-05-13 MED ORDER — PROCHLORPERAZINE MALEATE 10 MG PO TABS
10.0000 mg | ORAL_TABLET | Freq: Once | ORAL | Status: AC
  Administered 2020-05-13: 10 mg via ORAL

## 2020-05-13 MED ORDER — BORTEZOMIB CHEMO SQ INJECTION 3.5 MG (2.5MG/ML)
1.3000 mg/m2 | Freq: Once | INTRAMUSCULAR | Status: AC
  Administered 2020-05-13: 2.25 mg via SUBCUTANEOUS
  Filled 2020-05-13: qty 0.9

## 2020-05-13 MED ORDER — DEXAMETHASONE 4 MG PO TABS
40.0000 mg | ORAL_TABLET | Freq: Once | ORAL | Status: AC
  Administered 2020-05-13: 40 mg via ORAL

## 2020-05-13 MED ORDER — DEXAMETHASONE 4 MG PO TABS
ORAL_TABLET | ORAL | Status: AC
  Filled 2020-05-13: qty 10

## 2020-05-13 MED ORDER — PROCHLORPERAZINE MALEATE 10 MG PO TABS
ORAL_TABLET | ORAL | Status: AC
  Filled 2020-05-13: qty 1

## 2020-05-13 MED ORDER — OXYCODONE HCL 5 MG PO TABS: 5.0000 mg | ORAL_TABLET | Freq: Four times a day (QID) | ORAL | 0 refills | Status: DC | PRN

## 2020-05-13 NOTE — Telephone Encounter (Signed)
Per MD review- proceed with velcade treatment today with anc of 1.4.

## 2020-05-13 NOTE — Telephone Encounter (Signed)
This RN received call from Dr Aris Lot stating need for additional records - this RN obtained and faxed for visit tomorrow to [phone].

## 2020-05-13 NOTE — Progress Notes (Signed)
[place]  Telephone:(336) [phone] Fax:(336) [phone]    ID: SANDHYA DENHERDER DOB: April 25, 1961  MR#: [id number]  YSA#:[id number]  Patient Care Team: Maurice Small, MD as PCP - General (Family Medicine) Yurem Viner, Virgie Dad, MD as Consulting Physician (Oncology) Latanya Maudlin, MD as Consulting Physician (Orthopedic Surgery) Everlene Farrier, MD as Consulting Physician (Obstetrics and Gynecology) Domingo Pulse, MD (Urology) Ronald Lobo, MD as Consulting Physician (Gastroenterology) Markus Daft, MD as Consulting Physician (Interventional Radiology) Erline Levine, MD as Consulting Physician (Neurosurgery) OTHER MD: Algis Liming. Lambird MD]  CHIEF COMPLAINT: Light chain myeloma  CURRENT TREATMENT: Bortezomib, dexamethasone; denosumab/Xgeva   INTERVAL HISTORY: Amillion returns today for follow up of her myeloma.  She continues Bortezomib and Dexamethasone weekly, started 03/31/2020.  She is tolerating this with no side effects that she is aware of and she is due for another dose today.   She also started Lenalidomide, but this had to be held last week due to a rash.  She was started on a Medrol Dosepak and the rash cleared within a day.  She then resumed treatment last week and again developed a rash so this has been permanently discontinued  She received her first dose of denosumab/Xgeva on 05/06/2020, with no unusual side effects.  Today we again discussed the possible toxicities side effects and complications of these agents including the possibility of osteonecrosis of the jaw.  She underwent total spinal MRI 05/07/2020 and this showed no cord impingement, epidural disease or cord compression but significant compression deformity at T7 which is the cause of her pain  REVIEW OF SYSTEMS: Triva feels very anxious today.  She is very uncomfortable because of the pain in her back which will not let her sleep.  She has been taking muscle relaxants which have not worked.  She also uses  Aleve Tylenol and oxycodone.  She has been taking about 2 oxycodones daily.  She is afraid to take more because initially they made her a bit woozy although they no longer do that.  She has been moderately constipated and she is taking Dulcolax for that.  She also has taste alteration and dry mouth.  Incidentally she does not find that the back pain improves when she gets the 40 mg of dexamethasone she gets weekly, although it did improve significantly when she was taking the Medrol Dosepak for her lenalidomide rash.   HISTORY OF CURRENT ILLNESS: From the original intake note:  GERTHA LICHTENBERG is a 59 y.o. woman that presented with low and thoracic back pain to Dr. Latanya Maudlin. She notes muscle spasms and a low grade fever of 100 degrees.   Labs completed on 02/19/2020 revealed a CBC that was within normal limits except for RBC 2.39, Hgb 8.8, HCT 26.3, MCV 110.0, MCH 36.8, RDW 17.7, AMC 91. CMP was within normal limits except for potassium 3.3, CO2 35. Uric Acid was within normal limits. Urine Protein was abnormal at 57. C-reactive protein was within normal limits. ANA screening was negative.   Thorasic Spine MRI on 02/22/2020 revealed diffuse marrow T1 hypointensity involving vertibral bodies and some spinous processes associated with mild STTR hyperintensity with mild wedge compression fracture of T7 and mild right superior endplate compression fracture of T11 that are suspicious for pathologic fractures. There is mild central zone narrowing at mid T7 with minimal-deformity of the cord. Differential considerationsinclude Lymphoma, multiple myeloma, and diffuse metastatic disease. There is partially visualized mild-moderate spinal canal stenosis and mild deformity of the cord at C5-6 and  C6-7 probable moderate-severe to severe-neutral foraminal narrowing which would be better assessed dedicated MRI of the cervical spine as clinically warranted.   Lab Results  Component Value Date   TOTALPROTELP 6.1  03/05/2020    Lab Results  Component Value Date   KPAFRELGTCHN 919.4 (H) 03/05/2020   LAMBDASER 7.0 03/05/2020   KAPLAMBRATIO 131.34 (H) 03/05/2020   Of note she had a CT of the abdomen on 01/03/2017 at [facility] which showed a well-defined sclerotic lesion in the left iliac bone consistent with a bone island and no lytic or sclerotic lesions anywhere else.   PAST MEDICAL HISTORY: Past Medical History:  Diagnosis Date  . Hypertension   . Kidney stones   . Kidney stones     PAST SURGICAL HISTORY: Past Surgical History:  Procedure Laterality Date  . CESAREAN SECTION    . CYSTOSCOPY WITH RETROGRADE PYELOGRAM, URETEROSCOPY AND STENT PLACEMENT Right 09/16/2015   Procedure: CYSTOSCOPY WITH RETROGRADE PYELOGRAM, URETEROSCOPY AND STENT PLACEMENT WITH HOLMIUM LASER ABLATION ;  Surgeon: Alexis Frock, MD;  Location: [facility];  Service: Urology;  Laterality: Right;  . CYSTOSCOPY/RETROGRADE/URETEROSCOPY  06/04/2012   Procedure: CYSTOSCOPY/RETROGRADE/URETEROSCOPY;  Surgeon: Ailene Rud, MD;  Location: [facility];  Service: Urology;  Laterality: Left;  . IR RADIOLOGIST EVAL & MGMT  04/29/2020  . UTERINE FIBROID EMBOLIZATION      FAMILY HISTORY: No family history on file. The patient has 1 brother, 2 sisters.  Both her parents died in their 21s.  There is no cancer history in the family to her knowledge   GYNECOLOGIC HISTORY:  No LMP recorded. Patient has had an ablation. 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  Her 2 sons are from her first marriage, [name] who is a Administrator, arts at [employer] who graduated from [employer] in Merchandiser, retail.  In 2016 she married her second husband, [name].  He is a Loss adjuster, chartered for [employer].  He has 3 children of his own living  in [place] and [place].    The patient attends [employer] DIRECTIVES: In the absence of any documents to the contrary the patient's husband is her healthcare power of attorney   HEALTH MAINTENANCE: Social History   Tobacco Use  . Smoking status: Never Smoker  . Smokeless tobacco: Never Used  Substance Use Topics  . Alcohol use: No  . Drug use: No    Colonoscopy: Due  PAP: Up-to-date  Bone density: Osteopenia Mammography: Up-to-date  Allergies  Allergen Reactions  . Sulfa Drugs Cross Reactors     From childhood per pt's mother , pt is unaware of allergy status    Current Outpatient Medications  Medication Sig Dispense Refill  . acyclovir (ZOVIRAX) 400 MG tablet Take 1 tablet (400 mg total) by mouth 2 (two) times daily. 60 tablet 3  . albuterol (PROVENTIL HFA;VENTOLIN HFA) 108 (90 Base) MCG/ACT inhaler Inhale 1-2 puffs into the lungs every 6 (six) hours as needed for wheezing or shortness of breath.    Marland Kitchen aspirin EC 325 MG tablet Take 1 tablet (325 mg total) by mouth daily. 30 tablet 0  . calcium carbonate (OS-CAL - DOSED IN MG OF ELEMENTAL CALCIUM) 1250 MG tablet Take 1 tablet by mouth daily.      Marland Kitchen  cetirizine (ZYRTEC) 10 MG tablet Take 10 mg by mouth daily.     . cyclobenzaprine (FLEXERIL) 10 MG tablet Take 1 tablet (10 mg total) by mouth 3 (three) times daily as needed for muscle spasms. 60 tablet 0  . hydrOXYzine (ATARAX/VISTARIL) 10 MG tablet Take 1 tablet (10 mg total) by mouth every 4 (four) hours as needed for anxiety. 30 tablet 0  . ibuprofen (ADVIL) 200 MG tablet Take 400 mg by mouth every 6 (six) hours as needed for moderate pain.    Marland Kitchen losartan-hydrochlorothiazide (HYZAAR) 100-25 MG per tablet Take 1 tablet by mouth daily.     . methylPREDNISolone (MEDROL) 4 MG tablet Take 1 tablet (4 mg total) by mouth daily. Taper 6,5,4,3,2,1 21 tablet 0  . Multiple Vitamins-Minerals (MULTIVITAMIN PO) Take 1 tablet by mouth daily.     . ondansetron  (ZOFRAN) 4 MG tablet Take 1 tablet (4 mg total) by mouth every 6 (six) hours. 30 tablet 0  . oxyCODONE (OXY IR/ROXICODONE) 5 MG immediate release tablet Take 1-2 tablets (5-10 mg total) by mouth every 6 (six) hours as needed for severe pain. 60 tablet 0  . potassium chloride (KLOR-CON) 10 MEQ tablet Take 2 tablets (20 mEq total) by mouth 2 (two) times daily. 120 tablet 0  . prochlorperazine (COMPAZINE) 10 MG tablet Take 1 tablet (10 mg total) by mouth every 6 (six) hours as needed (Nausea or vomiting). 30 tablet 1  . promethazine (PHENERGAN) 25 MG tablet Take 1 tablet (25 mg total) by mouth every 6 (six) hours as needed for nausea. 12 tablet 0  . senna-docusate (SENOKOT-S) 8.6-50 MG tablet Take 1 tablet by mouth 2 (two) times daily. While taking pain meds to prevent constipation 30 tablet 0  . sertraline (ZOLOFT) 50 MG tablet Take 75 mg by mouth daily.    . traZODone (DESYREL) 50 MG tablet Take 50 mg by mouth at bedtime.     No current facility-administered medications for this visit.    OBJECTIVE: White woman who appears stated age  76:   05/13/20 1353 05/13/20 1414  BP: 114/65   Pulse: (!) 124 63  Resp: 18   Temp: 97.7 F (36.5 C)   SpO2: 98%    Wt Readings from Last 3 Encounters:  05/13/20 124 lb 3.2 oz (56.3 kg)  05/06/20 129 lb 9.6 oz (58.8 kg)  04/29/20 129 lb (58.5 kg)   Body mass index is 17.82 kg/m.    ECOG FS:1 - Symptomatic but completely ambulatory  Sclerae unicteric, EOMs intact Wearing a mask No cervical or supraclavicular adenopathy Lungs no rales or rhonchi Heart regular rate and rhythm Abd soft, nontender, positive bowel sounds MSK focal mid back spinal tenderness Neuro: nonfocal, well oriented, appropriate affect Breasts: Deferred Skin: Rash not currently apparent  Skin rash 04/23/2020      LAB RESULTS:  CMP     Component Value Date/Time   NA 139 05/13/2020 1331   K 3.1 (L) 05/13/2020 1331   CL 100 05/13/2020 1331   CO2 31 05/13/2020 1331    GLUCOSE 90 05/13/2020 1331   BUN 22 (H) 05/13/2020 1331   CREATININE 0.76 05/13/2020 1331   CALCIUM 9.1 05/13/2020 1331   PROT 6.8 05/13/2020 1331   ALBUMIN 3.8 05/13/2020 1331   AST 15 05/13/2020 1331   ALT 23 05/13/2020 1331   ALKPHOS 103 05/13/2020 1331   BILITOT 0.6 05/13/2020 1331   GFRNONAA >60 05/13/2020 1331   GFRAA >60 05/13/2020 1331    Lab Results  Component Value Date   TOTALPROTELP 5.7 (L) 04/15/2020     Lab Results  Component Value Date   KPAFRELGTCHN 1,244.5 (H) 04/15/2020   LAMBDASER 3.4 (L) 04/15/2020   KAPLAMBRATIO 6,153.79 (H) 04/23/2020    Lab Results  Component Value Date   WBC 3.1 (L) 05/13/2020   NEUTROABS 1.4 (L) 05/13/2020   HGB 11.4 (L) 05/13/2020   HCT 34.2 (L) 05/13/2020   MCV 93.4 05/13/2020   PLT 129 (L) 05/13/2020    No results found for: LABCA2  No components found for: KFEXMD470  No results for input(s): INR in the last 168 hours.  No results found for: LABCA2  No results found for: LKH574  No results found for: BBU037  No results found for: QDU438  No results found for: CA2729  No components found for: HGQUANT  No results found for: CEA1 / No results found for: CEA1   No results found for: AFPTUMOR  No results found for: CHROMOGRNA  No results found for: PSA1  Appointment on 05/13/2020  Component Date Value Ref Range Status  . WBC Count 05/13/2020 3.1* 4.0 - 10.5 K/uL Final  . RBC 05/13/2020 3.66* 3.87 - 5.11 MIL/uL Final  . Hemoglobin 05/13/2020 11.4* 12.0 - 15.0 g/dL Final  . HCT 05/13/2020 34.2* 36 - 46 % Final  . MCV 05/13/2020 93.4  80.0 - 100.0 fL Final  . MCH 05/13/2020 31.1  26.0 - 34.0 pg Final  . MCHC 05/13/2020 33.3  30.0 - 36.0 g/dL Final  . RDW 05/13/2020 18.6* 11.5 - 15.5 % Final  . Platelet Count 05/13/2020 129* 150 - 400 K/uL Final  . nRBC 05/13/2020 0.0  0.0 - 0.2 % Final  . Neutrophils Relative % 05/13/2020 47  % Final  . Neutro Abs 05/13/2020 1.4* 1.7 - 7.7 K/uL Final  . Lymphocytes  Relative 05/13/2020 44  % Final  . Lymphs Abs 05/13/2020 1.3  0.7 - 4.0 K/uL Final  . Monocytes Relative 05/13/2020 5  % Final  . Monocytes Absolute 05/13/2020 0.2  0 - 1 K/uL Final  . Eosinophils Relative 05/13/2020 3  % Final  . Eosinophils Absolute 05/13/2020 0.1  0 - 0 K/uL Final  . Basophils Relative 05/13/2020 1  % Final  . Basophils Absolute 05/13/2020 0.0  0 - 0 K/uL Final  . Immature Granulocytes 05/13/2020 0  % Final  . Abs Immature Granulocytes 05/13/2020 0.01  0.00 - 0.07 K/uL Final   Performed at [facility], [place] [address] [place], [place] [address]  . Sodium 05/13/2020 139  135 - 145 mmol/L Final  . Potassium 05/13/2020 3.1* 3.5 - 5.1 mmol/L Final  . Chloride 05/13/2020 100  98 - 111 mmol/L Final  . CO2 05/13/2020 31  22 - 32 mmol/L Final  . Glucose, Bld 05/13/2020 90  70 - 99 mg/dL Final   Glucose reference range applies only to samples taken after fasting for at least 8 hours.  . BUN 05/13/2020 22* 6 - 20 mg/dL Final  . Creatinine, Ser 05/13/2020 0.76  0.44 - 1.00 mg/dL Final  . Calcium 05/13/2020 9.1  8.9 - 10.3 mg/dL Final  . Total Protein 05/13/2020 6.8  6.5 - 8.1 g/dL Final  . Albumin 05/13/2020 3.8  3.5 - 5.0 g/dL Final  . AST 05/13/2020 15  15 - 41 U/L Final  . ALT 05/13/2020 23  0 - 44 U/L Final  . Alkaline Phosphatase 05/13/2020 103  38 - 126 U/L Final  . Total Bilirubin  05/13/2020 0.6  0.3 - 1.2 mg/dL Final  . GFR calc non Af Amer 05/13/2020 >60  >60 mL/min Final  . GFR calc Af Amer 05/13/2020 >60  >60 mL/min Final  . Anion gap 05/13/2020 8  5 - 15 Final   Performed at [facility], [place] [address], [place], [place] [address]  . Creatinine, Urine 05/13/2020 178.33  mg/dL Final  . Total Protein, Urine 05/13/2020 76  mg/dL Final   NO NORMAL RANGE ESTABLISHED FOR THIS TEST  . Protein Creatinine Ratio 05/13/2020 0.43* 0.00 - 0.15 mg/mg[Cre] Final   Performed at [facility] [address] [place], [place] [address]    (this displays the last labs from the last 3 days)  Lab Results  Component Value Date   TOTALPROTELP 5.7 (L) 04/15/2020   (this displays SPEP labs)  Lab Results  Component Value Date   KPAFRELGTCHN 1,244.5 (H) 04/15/2020   LAMBDASER 3.4 (L) 04/15/2020   KAPLAMBRATIO 8,502.77 (H) 04/23/2020   (kappa/lambda light chains)  No results found for: HGBA, HGBA2QUANT, HGBFQUANT, HGBSQUAN (Hemoglobinopathy evaluation)   Lab Results  Component Value Date   LDH 240 (H) 04/08/2020    No results found for: IRON, TIBC, IRONPCTSAT (Iron and TIBC)  No results found for: FERRITIN  Urinalysis    Component Value Date/Time   COLORURINE YELLOW 09/15/2015 2315   APPEARANCEUR CLOUDY (A) 09/15/2015 2315   LABSPEC 1.014 09/15/2015 2315   PHURINE 8.5 (H) 09/15/2015 2315   GLUCOSEU NEGATIVE 09/15/2015 2315   HGBUR SMALL (A) 09/15/2015 2315   BILIRUBINUR NEGATIVE 09/15/2015 2315   KETONESUR 15 (A) 09/15/2015 2315   PROTEINUR NEGATIVE 09/15/2015 2315   UROBILINOGEN 1.0 05/24/2013 1913   NITRITE NEGATIVE 09/15/2015 2315   LEUKOCYTESUR TRACE (A) 09/15/2015 2315     STUDIES:  DG Thoracic Spine 2 View  Result Date: 04/15/2020 CLINICAL DATA:  Back pain. EXAM: THORACIC SPINE 2 VIEWS COMPARISON:  None. FINDINGS: A chronic compression fracture deformity is seen involving the T7 vertebral body. Alignment is normal. No other significant bone abnormalities are identified. IMPRESSION: Chronic compression fracture deformity of the T7 vertebral body. Electronically Signed   By: Virgina Norfolk M.D.   On: 04/15/2020 17:42   MR TOTAL SPINE METS SCREENING  Result Date: 05/07/2020 CLINICAL DATA:  Multiple myeloma, severe back spasms EXAM: MRI TOTAL SPINE WITHOUT AND WITH CONTRAST TECHNIQUE: Multisequence MR imaging of the spine from the cervical spine to the sacrum was performed prior to and following IV contrast administration for evaluation of spinal metastatic disease.  CONTRAST:  31m GADAVIST GADOBUTROL 1 MMOL/ML IV SOLN COMPARISON:  Outside MRI 02/22/2020 FINDINGS: Sagittal imaging of the spine performed in upper and lower stations. Axial images were obtained of the lower cervical spine. MRI CERVICAL SPINE Alignment: Stable including mild retrolisthesis at C5-C6 and C6-C7. Vertebrae: Stable vertebral body heights. Abnormal marrow signal reflecting known myeloma. No significant epidural or foraminal extension. Cord: No abnormal signal.  No abnormal intrathecal enhancement. Posterior Fossa, vertebral arteries, paraspinal tissues: Unremarkable. Disc levels: Multilevel degenerative changes are present, greatest at C5-C6 and C6-C7. There is moderate to severe canal stenosis at these levels. However, this is unchanged from the prior study. Bilateral foraminal stenosis is also present. MRI THORACIC SPINE Alignment:  Stable. Vertebrae: Increased pathologic compression deformity of T7 without significant canal compromise. Abnormal marrow signal reflecting known myeloma. There is mild extraosseous extension about the left T3 posterior elements. No significant epidural or foraminal extension. Cord:  No abnormal signal.  No abnormal intrathecal enhancement. Paraspinal and other soft tissues: Unremarkable. Disc levels: No significant degenerative canal or foraminal stenosis. MRI LUMBAR SPINE Segmentation:  Standard. Alignment:  Preserved. Vertebrae: No compression deformity. Abnormal marrow signal reflecting known myeloma. Conus medullaris: Extends to the L1 level and appears normal. Paraspinal and other soft tissues: Unremarkable. Disc levels: Disc bulge with endplate osteophytic ridging at L5-S1. No high-grade degenerative stenosis. IMPRESSION: Diffusely abnormal osseous marrow signal reflecting known myeloma. Increased pathologic compression deformity of T7 without significant canal compromise. No significant epidural disease or cord compression. Electronically Signed   By: Macy Mis  M.D.   On: 05/07/2020 08:20   IR Radiologist Eval & Mgmt  Result Date: 04/29/2020 Please refer to notes tab for details about interventional procedure. (Op Note)    ELIGIBLE FOR AVAILABLE RESEARCH PROTOCOL: no   ASSESSMENT: 59 y.o. [place] woman presenting June 2021 with severe back pain, thoracic MRI 02/22/2020 suggestive of possible myeloma.  Work-up included:  (a) 03/05/2020 Myeloma panel shows no M protein but all immunoglobulins are decreased.  The lambda ratio is markedly abnormal at 131.34; review of the blood film is not diagnostic  (b) 24-hour urine: markedly elevated Kappa light chains at 9,656; urine kappa/lambda ratio 4180.24, and urine M spike of 239.  (c) bone survey on 03/16/2020 shows rounded lucencies throughout the skull which could be venous lakes  (d) bone marrow biopsy on 7/27 confirms multiple myeloma with 44% plasma cells on the aspirate by manual differential, and 70 to 80% on the biopsy by CD138 immunohistochemistry  (e) cytogenetics  03/24/2020: no metaphase cells available for analysis  (f) myeloma FISH studies 03/24/2020 show t(11,14), with NO del(17p), t(1/14) or t(14.16)  (g) on 04/08/2020 LDH 240, beta-2-microglobulin 4.4, albumin 4.2  (h) R-ISS Stage: II   (1) bortezomib/dexamethasone started 03/31/2020  (a) Lenalidomide added on 04/21/2020, stopped on 8/26 after two doses secondary to rash, resumed 04/29/2020, discontinued secondary due to rash  (b) on ASA and acyclovir for supportive care  (2) denosumab/Xgeva started 05/06/2020   PLAN: [place] will proceed to her seventh dose of bortezomib/dexamethasone today.  She tolerates this well and we have evidence of initial response.  We have tried to add lenalidomide but she developed a significant rash so we will need to use other agents to get her more quickly into remission.  I have set her up with Dr. Clarita Leber at [facility] and she will see him tomorrow.  Dr. Aris Lot can provide not only a  second opinion regarding treatment but more importantly guide her in considerations of autologous transplantation, which we do not do here.  I am hopeful [place] will be able to get her kyphoplasty done next week after she is evaluated through our neurosurgery group and specifically Dr. Vertell Limber.  Once that gets done I am hopeful her back pain will essentially resolved but in the meantime I have asked her to continue to take naproxen plus Tylenol as needed and also oxycodone as needed.  She does not find the muscle relaxants of any help and in my experience they seldom are helpful.  As far as the constipation is concerned she will be on MiraLAX and stool softeners daily and she may take any laxative if no bowel movement occurs after 48 hours.  She is very concerned about her son currently in college who is very anxious about her she says.  We discussed ways to address that and whenever he is in town and she is seeing [place] of  course he can join her during the visit (we can broaden the visiting rules as needed).  She will see me again next week.  She knows to call for any other issue that may develop before the next visit.  Total encounter time 35 minutes.Sarajane Jews C. Tiasha Helvie, MD 05/13/20 5:29 PM Medical Oncology and Hematology [facility] [place], [place] [address] Tel. [phone]    Fax. [phone]   I, Wilburn Mylar, am acting as scribe for Dr. Virgie Dad. Henson Fraticelli.  I, Lurline Del MD, have reviewed the above documentation for accuracy and completeness, and I agree with the above.   *Total Encounter Time as defined by the Centers for Medicare and Medicaid Services includes, in addition to the face-to-face time of a patient visit (documented in the note above) non-face-to-face time: obtaining and reviewing outside history, ordering and reviewing medications, tests or procedures, care coordination (communications with other health care professionals or  caregivers) and documentation in the medical record.

## 2020-05-13 NOTE — Patient Instructions (Signed)
[facility] Discharge Instructions for Patients Receiving Chemotherapy  Today you received the following chemotherapy agents Velcade.  To help prevent nausea and vomiting after your treatment, we encourage you to take your nausea medication as directed.  If you develop nausea and vomiting that is not controlled by your nausea medication, call the clinic.   BELOW ARE SYMPTOMS THAT SHOULD BE REPORTED IMMEDIATELY:  *FEVER GREATER THAN 100.5 F  *CHILLS WITH OR WITHOUT FEVER  NAUSEA AND VOMITING THAT IS NOT CONTROLLED WITH YOUR NAUSEA MEDICATION  *UNUSUAL SHORTNESS OF BREATH  *UNUSUAL BRUISING OR BLEEDING  TENDERNESS IN MOUTH AND THROAT WITH OR WITHOUT PRESENCE OF ULCERS  *URINARY PROBLEMS  *BOWEL PROBLEMS  UNUSUAL RASH Items with * indicate a potential emergency and should be followed up as soon as possible.  Feel free to call the clinic should you have any questions or concerns. The clinic phone number is [phone].  Please show the CHEMO ALERT CARD at check-in to the Emergency Department and triage nurse.   

## 2020-05-14 DIAGNOSIS — X58XXXD Exposure to other specified factors, subsequent encounter: Secondary | ICD-10-CM | POA: Diagnosis not present

## 2020-05-14 DIAGNOSIS — Z79899 Other long term (current) drug therapy: Secondary | ICD-10-CM | POA: Diagnosis not present

## 2020-05-14 DIAGNOSIS — S22060D Wedge compression fracture of T7-T8 vertebra, subsequent encounter for fracture with routine healing: Secondary | ICD-10-CM | POA: Diagnosis not present

## 2020-05-14 DIAGNOSIS — C9000 Multiple myeloma not having achieved remission: Secondary | ICD-10-CM | POA: Diagnosis not present

## 2020-05-15 ENCOUNTER — Other Ambulatory Visit: Payer: Self-pay | Admitting: *Deleted

## 2020-05-15 NOTE — Telephone Encounter (Signed)
This RN returned the patient's call per update on visit to [facility] regarding transplant.  Note per visit discussion- Dr Aris Lot will arrange for the patient to have kyphoplasty at their facility - which pt is in agreement with.  This RN answered pt's questions as well as reassurance given per recommended plan.  No further needs at this time.

## 2020-05-16 ENCOUNTER — Other Ambulatory Visit: Payer: Self-pay | Admitting: Oncology

## 2020-05-18 ENCOUNTER — Inpatient Hospital Stay: Payer: BC Managed Care – PPO

## 2020-05-20 ENCOUNTER — Inpatient Hospital Stay: Payer: BC Managed Care – PPO

## 2020-05-20 ENCOUNTER — Other Ambulatory Visit: Payer: Self-pay

## 2020-05-20 ENCOUNTER — Inpatient Hospital Stay (HOSPITAL_BASED_OUTPATIENT_CLINIC_OR_DEPARTMENT_OTHER): Payer: BC Managed Care – PPO | Admitting: Oncology

## 2020-05-20 VITALS — BP 115/56 | HR 111 | Temp 97.7°F | Resp 20 | Ht 70.0 in | Wt 124.0 lb

## 2020-05-20 VITALS — HR 105

## 2020-05-20 DIAGNOSIS — Z5112 Encounter for antineoplastic immunotherapy: Secondary | ICD-10-CM | POA: Diagnosis not present

## 2020-05-20 DIAGNOSIS — C9000 Multiple myeloma not having achieved remission: Secondary | ICD-10-CM

## 2020-05-20 LAB — CBC WITH DIFFERENTIAL (CANCER CENTER ONLY)
Abs Immature Granulocytes: 0.02 10*3/uL (ref 0.00–0.07)
Basophils Absolute: 0 10*3/uL (ref 0.0–0.1)
Basophils Relative: 1 %
Eosinophils Absolute: 0.1 10*3/uL (ref 0.0–0.5)
Eosinophils Relative: 2 %
HCT: 33 % — ABNORMAL LOW (ref 36.0–46.0)
Hemoglobin: 10.9 g/dL — ABNORMAL LOW (ref 12.0–15.0)
Immature Granulocytes: 1 %
Lymphocytes Relative: 40 %
Lymphs Abs: 1.5 10*3/uL (ref 0.7–4.0)
MCH: 31.1 pg (ref 26.0–34.0)
MCHC: 33 g/dL (ref 30.0–36.0)
MCV: 94 fL (ref 80.0–100.0)
Monocytes Absolute: 0.2 10*3/uL (ref 0.1–1.0)
Monocytes Relative: 5 %
Neutro Abs: 1.9 10*3/uL (ref 1.7–7.7)
Neutrophils Relative %: 51 %
Platelet Count: 132 10*3/uL — ABNORMAL LOW (ref 150–400)
RBC: 3.51 MIL/uL — ABNORMAL LOW (ref 3.87–5.11)
RDW: 18.7 % — ABNORMAL HIGH (ref 11.5–15.5)
WBC Count: 3.7 10*3/uL — ABNORMAL LOW (ref 4.0–10.5)
nRBC: 0.5 % — ABNORMAL HIGH (ref 0.0–0.2)

## 2020-05-20 LAB — COMPREHENSIVE METABOLIC PANEL
ALT: 18 U/L (ref 0–44)
AST: 16 U/L (ref 15–41)
Albumin: 3.6 g/dL (ref 3.5–5.0)
Alkaline Phosphatase: 111 U/L (ref 38–126)
Anion gap: 3 — ABNORMAL LOW (ref 5–15)
BUN: 15 mg/dL (ref 6–20)
CO2: 36 mmol/L — ABNORMAL HIGH (ref 22–32)
Calcium: 9 mg/dL (ref 8.9–10.3)
Chloride: 98 mmol/L (ref 98–111)
Creatinine, Ser: 0.71 mg/dL (ref 0.44–1.00)
GFR calc Af Amer: >60 mL/min (ref >60)
GFR calc non Af Amer: >60 mL/min (ref >60)
Glucose, Bld: 88 mg/dL (ref 70–99)
Potassium: 4.1 mmol/L (ref 3.5–5.1)
Sodium: 137 mmol/L (ref 135–145)
Total Bilirubin: 0.6 mg/dL (ref 0.3–1.2)
Total Protein: 6.2 g/dL — ABNORMAL LOW (ref 6.5–8.1)

## 2020-05-20 MED ORDER — VALACYCLOVIR HCL 1 G PO TABS: 1000.0000 mg | ORAL_TABLET | Freq: Two times a day (BID) | ORAL | 6 refills | Status: DC

## 2020-05-20 MED ORDER — PROCHLORPERAZINE MALEATE 10 MG PO TABS
ORAL_TABLET | ORAL | Status: AC
  Filled 2020-05-20: qty 1

## 2020-05-20 MED ORDER — DEXAMETHASONE 4 MG PO TABS
ORAL_TABLET | ORAL | Status: AC
  Filled 2020-05-20: qty 10

## 2020-05-20 MED ORDER — BORTEZOMIB CHEMO SQ INJECTION 3.5 MG (2.5MG/ML)
1.3000 mg/m2 | Freq: Once | INTRAMUSCULAR | Status: AC
  Administered 2020-05-20: 2.25 mg via SUBCUTANEOUS
  Filled 2020-05-20: qty 0.9

## 2020-05-20 MED ORDER — DEXAMETHASONE 4 MG PO TABS
40.0000 mg | ORAL_TABLET | Freq: Once | ORAL | Status: AC
  Administered 2020-05-20: 40 mg via ORAL

## 2020-05-20 MED ORDER — PROCHLORPERAZINE MALEATE 10 MG PO TABS
10.0000 mg | ORAL_TABLET | Freq: Once | ORAL | Status: AC
  Administered 2020-05-20: 10 mg via ORAL

## 2020-05-20 NOTE — Patient Instructions (Signed)
[facility] Discharge Instructions for Patients Receiving Chemotherapy  Today you received the following chemotherapy agents Velcade.  To help prevent nausea and vomiting after your treatment, we encourage you to take your nausea medication as directed.  If you develop nausea and vomiting that is not controlled by your nausea medication, call the clinic.   BELOW ARE SYMPTOMS THAT SHOULD BE REPORTED IMMEDIATELY:  *FEVER GREATER THAN 100.5 F  *CHILLS WITH OR WITHOUT FEVER  NAUSEA AND VOMITING THAT IS NOT CONTROLLED WITH YOUR NAUSEA MEDICATION  *UNUSUAL SHORTNESS OF BREATH  *UNUSUAL BRUISING OR BLEEDING  TENDERNESS IN MOUTH AND THROAT WITH OR WITHOUT PRESENCE OF ULCERS  *URINARY PROBLEMS  *BOWEL PROBLEMS  UNUSUAL RASH Items with * indicate a potential emergency and should be followed up as soon as possible.  Feel free to call the clinic should you have any questions or concerns. The clinic phone number is [phone].  Please show the CHEMO ALERT CARD at check-in to the Emergency Department and triage nurse.   

## 2020-05-20 NOTE — Progress Notes (Signed)
Ok to treat today with elevated HR per MD Magrinat.

## 2020-05-20 NOTE — Progress Notes (Signed)
[facility]  [phone] [phone]    ID: Robin Wilson DOB: Jun 02, 1961  MR#: [id number]  OVF#:[id number]  Patient Care Team: Maurice Small, MD as PCP - General (Family Medicine) Waunetta Riggle, Virgie Dad, MD as Consulting Physician (Oncology) Latanya Maudlin, MD as Consulting Physician (Orthopedic Surgery) Everlene Farrier, MD as Consulting Physician (Obstetrics and Gynecology) Domingo Pulse, MD (Urology) Ronald Lobo, MD as Consulting Physician (Gastroenterology) Markus Daft, MD as Consulting Physician (Interventional Radiology) Erline Levine, MD as Consulting Physician (Neurosurgery) OTHER MD: Algis Liming. Lambird MD]  CHIEF COMPLAINT: Light chain myeloma  CURRENT TREATMENT: Bortezomib, dexamethasone; daratumumab; denosumab/Xgeva   INTERVAL HISTORY: Zyona returns today for follow up of her myeloma accompanied by her husband.  Her son Rodman Key also participated briefly through speaker phone  Since her last visit, Yuriana met with Dr. Aris Lot at [facility] on 05/14/2020.  He discussed her diagnosis, prognosis, and overall treatment plan and specifically went over issues relating to eventual autologous transplantation.  He suggested giving lenalidomide a third try and if that did not work adding daratumumab  She continues Bortezomib and Dexamethasone weekly, started 03/31/2020.  She is tolerating this with no side effects that she is aware of and she is due for another dose today.  The free urinary kappa lambda ratio as well as a total kappa light chains in the urine continue to decrease   ODETTA, FORNESS (MRN [id number]) as of 05/22/2020 11:33  Ref. Range 03/24/2020 08:17 04/23/2020 09:03 05/13/2020 13:32 05/20/2020 15:00  Free Kappa/Lambda Ratio Latest Ref Range: 1.03 - 31.76  4,449.95 (H) 3,879.32 (H)  2,329.41 (H)  Results for SHYKERIA, SAKAMOTO (MRN [id number]) as of 05/22/2020 11:33  Ref. Range 03/24/2020 08:17 04/23/2020 09:03 05/13/2020 13:32 05/20/2020 15:00  Free Kappa  Lt Chains,Ur Latest Ref Range: 0.63 - 113.79 mg/L 9,656.39 (H) 6,982.77 (H)  4,845.18 (H)   She received her first dose of denosumab/Xgeva on 05/06/2020, with no unusual side effects.     REVIEW OF SYSTEMS: Gladie returned to lenalidomide after meeting with Dr. Aris Lot but after 2 days as she had to discontinue it with repeated symptoms.  She continues to have back pain related to her compression fracture and she is scheduled for an appointment 05/29/2020 to discuss kyphoplasty at [facility] which hopefully will be scheduled shortly thereafter.  Her pain is moderately controlled on oxycodone.  This is causing constipation.  She has had no peripheral neuropathy from her treatment so far.  Aside from these issues a detailed review of systems is stable   HISTORY OF CURRENT ILLNESS: From the original intake note:  Robin Wilson is a 59 y.o. woman that presented with low and thoracic back pain to Dr. Latanya Maudlin. She notes muscle spasms and a low grade fever of 100 degrees.   Labs completed on 02/19/2020 revealed a CBC that was within normal limits except for RBC 2.39, Hgb 8.8, HCT 26.3, MCV 110.0, MCH 36.8, RDW 17.7, AMC 91. CMP was within normal limits except for potassium 3.3, CO2 35. Uric Acid was within normal limits. Urine Protein was abnormal at 57. C-reactive protein was within normal limits. ANA screening was negative.   Thorasic Spine MRI on 02/22/2020 revealed diffuse marrow T1 hypointensity involving vertibral bodies and some spinous processes associated with mild STTR hyperintensity with mild wedge compression fracture of T7 and mild right superior endplate compression fracture of T11 that are suspicious for pathologic fractures. There is mild central zone narrowing at mid T7 with minimal-deformity of  the cord. Differential considerationsinclude Lymphoma, multiple myeloma, and diffuse metastatic disease. There is partially visualized mild-moderate spinal canal stenosis and mild deformity of  the cord at C5-6 and C6-7 probable moderate-severe to severe-neutral foraminal narrowing which would be better assessed dedicated MRI of the cervical spine as clinically warranted.   Lab Results  Component Value Date   TOTALPROTELP 6.1 03/05/2020    Lab Results  Component Value Date   KPAFRELGTCHN 919.4 (H) 03/05/2020   LAMBDASER 7.0 03/05/2020   KAPLAMBRATIO 131.34 (H) 03/05/2020   Of note she had a CT of the abdomen on 01/03/2017 at [facility] which showed a well-defined sclerotic lesion in the left iliac bone consistent with a bone island and no lytic or sclerotic lesions anywhere else.   PAST MEDICAL HISTORY: Past Medical History:  Diagnosis Date  . Hypertension   . Kidney stones   . Kidney stones     PAST SURGICAL HISTORY: Past Surgical History:  Procedure Laterality Date  . CESAREAN SECTION    . CYSTOSCOPY WITH RETROGRADE PYELOGRAM, URETEROSCOPY AND STENT PLACEMENT Right 09/16/2015   Procedure: CYSTOSCOPY WITH RETROGRADE PYELOGRAM, URETEROSCOPY AND STENT PLACEMENT WITH HOLMIUM LASER ABLATION ;  Surgeon: Alexis Frock, MD;  Location: [facility];  Service: Urology;  Laterality: Right;  . CYSTOSCOPY/RETROGRADE/URETEROSCOPY  06/04/2012   Procedure: CYSTOSCOPY/RETROGRADE/URETEROSCOPY;  Surgeon: Ailene Rud, MD;  Location: [facility];  Service: Urology;  Laterality: Left;  . IR RADIOLOGIST EVAL & MGMT  04/29/2020  . UTERINE FIBROID EMBOLIZATION      FAMILY HISTORY: No family history on file. The patient has 1 brother, 2 sisters.  Both her parents died in their 40s.  There is no cancer history in the family to her knowledge   GYNECOLOGIC HISTORY:  No LMP recorded. Patient has had an ablation. 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  Her 2 sons  are from her first marriage, [name] who is a Administrator, arts at [employer] who graduated from [employer] in Merchandiser, retail.  In 2016 she married her second husband, [name].  He is a Loss adjuster, chartered for [employer].  He has 3 children of his own living in [place] and [place].    The patient attends [employer] DIRECTIVES: In the absence of any documents to the contrary the patient's husband is her healthcare power of attorney   HEALTH MAINTENANCE: Social History   Tobacco Use  . Smoking status: Never Smoker  . Smokeless tobacco: Never Used  Substance Use Topics  . Alcohol use: No  . Drug use: No    Colonoscopy: Due  PAP: Up-to-date  Bone density: Osteopenia Mammography: Up-to-date  Allergies  Allergen Reactions  . Sulfa Drugs Cross Reactors     From childhood per pt's mother , pt is unaware of allergy status    Current Outpatient Medications  Medication Sig Dispense Refill  . albuterol (PROVENTIL HFA;VENTOLIN HFA) 108 (90 Base) MCG/ACT inhaler Inhale 1-2 puffs into the lungs every 6 (six) hours as needed for wheezing or shortness of breath.    Marland Kitchen aspirin EC 325 MG tablet Take 1 tablet (325 mg total) by mouth daily. 30 tablet 0  . calcium carbonate (OS-CAL - DOSED IN MG OF ELEMENTAL CALCIUM) 1250 MG tablet Take 1 tablet by mouth daily.      Marland Kitchen  cetirizine (ZYRTEC) 10 MG tablet Take 10 mg by mouth daily.     . hydrOXYzine (ATARAX/VISTARIL) 10 MG tablet Take 1 tablet (10 mg total) by mouth every 4 (four) hours as needed for anxiety. 30 tablet 0  . ibuprofen (ADVIL) 200 MG tablet Take 400 mg by mouth every 6 (six) hours as needed for moderate pain.    Marland Kitchen losartan-hydrochlorothiazide (HYZAAR) 100-25 MG per tablet Take 1 tablet by mouth daily.     . Multiple Vitamins-Minerals (MULTIVITAMIN PO) Take 1 tablet by mouth daily.     . ondansetron (ZOFRAN) 4 MG tablet Take 1 tablet (4 mg total) by mouth every 6 (six) hours. 30 tablet 0  . oxyCODONE (OXY IR/ROXICODONE) 5 MG  immediate release tablet Take 1-2 tablets (5-10 mg total) by mouth every 6 (six) hours as needed for severe pain. 60 tablet 0  . potassium chloride (KLOR-CON) 10 MEQ tablet Take 2 tablets (20 mEq total) by mouth 2 (two) times daily. 120 tablet 0  . prochlorperazine (COMPAZINE) 10 MG tablet Take 1 tablet (10 mg total) by mouth every 6 (six) hours as needed (Nausea or vomiting). 30 tablet 1  . promethazine (PHENERGAN) 25 MG tablet Take 1 tablet (25 mg total) by mouth every 6 (six) hours as needed for nausea. 12 tablet 0  . senna-docusate (SENOKOT-S) 8.6-50 MG tablet Take 1 tablet by mouth 2 (two) times daily. While taking pain meds to prevent constipation 30 tablet 0  . sertraline (ZOLOFT) 50 MG tablet Take 75 mg by mouth daily.    . traZODone (DESYREL) 50 MG tablet Take 50 mg by mouth at bedtime.    . valACYclovir (VALTREX) 1000 MG tablet Take 1 tablet (1,000 mg total) by mouth 2 (two) times daily. (Patient taking differently: Take 1,000 mg by mouth daily. ) 30 tablet 6   No current facility-administered medications for this visit.    OBJECTIVE: White woman who appears younger than stated age  51:   05/20/20 1428  BP: (!) 115/56  Pulse: (!) 111  Resp: 20  Temp: 97.7 F (36.5 C)  SpO2: 97%   Wt Readings from Last 3 Encounters:  05/20/20 124 lb (56.2 kg)  05/13/20 124 lb 3.2 oz (56.3 kg)  05/06/20 129 lb 9.6 oz (58.8 kg)   Body mass index is 17.79 kg/m.    ECOG FS:1 - Symptomatic but completely ambulatory  Sclerae unicteric, EOMs intact Wearing a mask No cervical or supraclavicular adenopathy Lungs no rales or rhonchi Heart regular rate and rhythm Abd soft, nontender, positive bowel sounds MSK moderate tenderness over the mid thoracic spine, no upper extremity lymphedema Neuro: nonfocal, well oriented, appropriate affect Breasts: Deferred  LAB RESULTS:  CMP     Component Value Date/Time   NA 137 05/20/2020 1414   K 4.1 05/20/2020 1414   CL 98 05/20/2020 1414   CO2  36 (H) 05/20/2020 1414   GLUCOSE 88 05/20/2020 1414   BUN 15 05/20/2020 1414   CREATININE 0.71 05/20/2020 1414   CALCIUM 9.0 05/20/2020 1414   PROT 6.2 (L) 05/20/2020 1414   ALBUMIN 3.6 05/20/2020 1414   AST 16 05/20/2020 1414   ALT 18 05/20/2020 1414   ALKPHOS 111 05/20/2020 1414   BILITOT 0.6 05/20/2020 1414   GFRNONAA >60 05/20/2020 1414   GFRAA >60 05/20/2020 1414    Lab Results  Component Value Date   TOTALPROTELP 5.7 (L) 04/15/2020     Lab Results  Component Value Date   KPAFRELGTCHN 1,244.5 (H) 04/15/2020  LAMBDASER 3.4 (L) 04/15/2020   KAPLAMBRATIO 2,329.41 (H) 05/20/2020    Lab Results  Component Value Date   WBC 3.7 (L) 05/20/2020   NEUTROABS 1.9 05/20/2020   HGB 10.9 (L) 05/20/2020   HCT 33.0 (L) 05/20/2020   MCV 94.0 05/20/2020   PLT 132 (L) 05/20/2020    No results found for: LABCA2  No components found for: [id number]  No results for input(s): INR in the last 168 hours.  No results found for: LABCA2  No results found for: JOI786  No results found for: VEH209  No results found for: OBS962  No results found for: CA2729  No components found for: HGQUANT  No results found for: CEA1 / No results found for: CEA1   No results found for: AFPTUMOR  No results found for: CHROMOGRNA  No results found for: HGBA, HGBA2QUANT, HGBFQUANT, HGBSQUAN (Hemoglobinopathy evaluation)   Lab Results  Component Value Date   LDH 240 (H) 04/08/2020    No results found for: IRON, TIBC, IRONPCTSAT (Iron and TIBC)  No results found for: FERRITIN  Urinalysis    Component Value Date/Time   COLORURINE YELLOW 09/15/2015 2315   APPEARANCEUR CLOUDY (A) 09/15/2015 2315   LABSPEC 1.014 09/15/2015 2315   PHURINE 8.5 (H) 09/15/2015 2315   GLUCOSEU NEGATIVE 09/15/2015 2315   HGBUR SMALL (A) 09/15/2015 2315   BILIRUBINUR NEGATIVE 09/15/2015 2315   KETONESUR 15 (A) 09/15/2015 2315   PROTEINUR NEGATIVE 09/15/2015 2315   UROBILINOGEN 1.0 05/24/2013 1913    NITRITE NEGATIVE 09/15/2015 2315   LEUKOCYTESUR TRACE (A) 09/15/2015 2315    STUDIES:  MR TOTAL SPINE METS SCREENING  Result Date: 05/07/2020 CLINICAL DATA:  Multiple myeloma, severe back spasms EXAM: MRI TOTAL SPINE WITHOUT AND WITH CONTRAST TECHNIQUE: Multisequence MR imaging of the spine from the cervical spine to the sacrum was performed prior to and following IV contrast administration for evaluation of spinal metastatic disease. CONTRAST:  29m GADAVIST GADOBUTROL 1 MMOL/ML IV SOLN COMPARISON:  Outside MRI 02/22/2020 FINDINGS: Sagittal imaging of the spine performed in upper and lower stations. Axial images were obtained of the lower cervical spine. MRI CERVICAL SPINE Alignment: Stable including mild retrolisthesis at C5-C6 and C6-C7. Vertebrae: Stable vertebral body heights. Abnormal marrow signal reflecting known myeloma. No significant epidural or foraminal extension. Cord: No abnormal signal.  No abnormal intrathecal enhancement. Posterior Fossa, vertebral arteries, paraspinal tissues: Unremarkable. Disc levels: Multilevel degenerative changes are present, greatest at C5-C6 and C6-C7. There is moderate to severe canal stenosis at these levels. However, this is unchanged from the prior study. Bilateral foraminal stenosis is also present. MRI THORACIC SPINE Alignment:  Stable. Vertebrae: Increased pathologic compression deformity of T7 without significant canal compromise. Abnormal marrow signal reflecting known myeloma. There is mild extraosseous extension about the left T3 posterior elements. No significant epidural or foraminal extension. Cord:  No abnormal signal.  No abnormal intrathecal enhancement. Paraspinal and other soft tissues: Unremarkable. Disc levels: No significant degenerative canal or foraminal stenosis. MRI LUMBAR SPINE Segmentation:  Standard. Alignment:  Preserved. Vertebrae: No compression deformity. Abnormal marrow signal reflecting known myeloma. Conus medullaris: Extends to the  L1 level and appears normal. Paraspinal and other soft tissues: Unremarkable. Disc levels: Disc bulge with endplate osteophytic ridging at L5-S1. No high-grade degenerative stenosis. IMPRESSION: Diffusely abnormal osseous marrow signal reflecting known myeloma. Increased pathologic compression deformity of T7 without significant canal compromise. No significant epidural disease or cord compression. Electronically Signed   By: PMacy MisM.D.   On: 05/07/2020 08:20  IR Radiologist Eval & Mgmt  Result Date: 04/29/2020 Please refer to notes tab for details about interventional procedure. (Op Note)    ELIGIBLE FOR AVAILABLE RESEARCH PROTOCOL: no   ASSESSMENT: 59 y.o. [place] woman presenting June 2021 with severe back pain, thoracic MRI 02/22/2020 suggestive of possible myeloma.  Work-up included:  (a) 03/05/2020 Myeloma panel shows no M protein but all immunoglobulins are decreased.  The lambda ratio is markedly abnormal at 131.34; review of the blood film is not diagnostic  (b) 24-hour urine: markedly elevated Kappa light chains at 9,656; urine kappa/lambda ratio 4180.24, and urine M spike of 239.  (c) bone survey on 03/16/2020 shows rounded lucencies throughout the skull which could be venous lakes  (d) bone marrow biopsy on 7/27 confirms multiple myeloma with 44% plasma cells on the aspirate by manual differential, and 70 to 80% on the biopsy by CD138 immunohistochemistry  (e) cytogenetics  03/24/2020: no metaphase cells available for analysis  (f) myeloma FISH studies 03/24/2020 show t(11,14), with NO del(17p), t(1/14) or t(14.16)  (g) on 04/08/2020 LDH 240, beta-2-microglobulin 4.4, albumin 4.2  (h) R-ISS Stage: II   (1) bortezomib/dexamethasone started 03/31/2020, discontinued after 05/20/2020 dose  (a) Lenalidomide added on 04/21/2020, stopped on 8/26 after two doses secondary to rash, resumed 04/29/2020 again with a very poor tolerance, treated with Medrol Dosepak, third  attempt 05/14/2020, again unable to tolerate   (b) on ASA and acyclovir for supportive care  (2) denosumab/Xgeva started 05/06/2020  (3) to start daratumumab hyaluronate/ bortezomib/ dexamethasone 05/27/2020  (a) dexametasone 20 mg po day of treatment and day 2   PLAN: Lielle will receive her eighth dose of bortezomib and dexamethasone today.  She has tolerated that well and it is producing a response with a significant drop in her urinary light chains.  She was being treated with lenalidomide, but after 3 attempts that is being discontinued.  Accordingly her second line chemotherapy will be daratumumab.  Today we discussed the possible toxicities side effects and complications of this agent which include not only cytopenias and infusion reactions, but also fatigue and other systemic symptoms.  She will receive 20 mg dexamethasone which H dose and repeat the dexamethasone on day 2.  I am hopeful by adding this antibody the response will be quicker and deeper so she can proceed to autologous transplantation sometime before the end of the year.  Unfortunately her son's participation by speaker phone was not entirely successful today.  We can try again at the next visit.  Total encounter time 45 minutes.Sarajane Jews C. Velma Agnes, MD 05/22/20 11:31 AM Medical Oncology and Hematology [facility] [place], [address] [address] Tel. [phone]    Fax. [phone]   I, Wilburn Mylar, am acting as scribe for Dr. Virgie Dad. Juston Goheen.  I, Lurline Del MD, have reviewed the above documentation for accuracy and completeness, and I agree with the above.   *Total Encounter Time as defined by the Centers for Medicare and Medicaid Services includes, in addition to the face-to-face time of a patient visit (documented in the note above) non-face-to-face time: obtaining and reviewing outside history, ordering and reviewing medications, tests or procedures, care coordination  (communications with other health care professionals or caregivers) and documentation in the medical record.

## 2020-05-21 ENCOUNTER — Telehealth: Payer: Self-pay | Admitting: Oncology

## 2020-05-21 ENCOUNTER — Encounter: Payer: Self-pay | Admitting: *Deleted

## 2020-05-21 LAB — KAPPA/LAMBDA LIGHT CHAINS, FREE, WITH RATIO, 24HR. URINE
FR KAPPA LT CH,24HR: 4602.92 mg/24 hr
FR LAMBDA LT CH,24HR: 1.98 mg/24 hr
Free Kappa Lt Chains,Ur: 4845.18 mg/L — ABNORMAL HIGH (ref 0.63–113.79)
Free Kappa/Lambda Ratio: 2329.41 — ABNORMAL HIGH (ref 1.03–31.76)
Free Lambda Lt Chains,Ur: 2.08 mg/L (ref 0.47–11.77)
Total Volume: 950

## 2020-05-21 NOTE — Telephone Encounter (Signed)
Scheduled MD visit between appt that were already scheduled per 9/22 los. Made no changes to pt's arrival time.

## 2020-05-22 DIAGNOSIS — C9000 Multiple myeloma not having achieved remission: Secondary | ICD-10-CM | POA: Insufficient documentation

## 2020-05-27 ENCOUNTER — Telehealth: Payer: Self-pay | Admitting: *Deleted

## 2020-05-27 ENCOUNTER — Inpatient Hospital Stay: Payer: BC Managed Care – PPO

## 2020-05-27 ENCOUNTER — Inpatient Hospital Stay (HOSPITAL_BASED_OUTPATIENT_CLINIC_OR_DEPARTMENT_OTHER): Payer: BC Managed Care – PPO | Admitting: Oncology

## 2020-05-27 ENCOUNTER — Other Ambulatory Visit: Payer: Self-pay

## 2020-05-27 ENCOUNTER — Other Ambulatory Visit: Payer: Self-pay | Admitting: *Deleted

## 2020-05-27 VITALS — BP 119/77 | HR 99 | Temp 97.7°F | Resp 19 | Wt 122.7 lb

## 2020-05-27 DIAGNOSIS — C9000 Multiple myeloma not having achieved remission: Secondary | ICD-10-CM

## 2020-05-27 DIAGNOSIS — Z5112 Encounter for antineoplastic immunotherapy: Secondary | ICD-10-CM | POA: Diagnosis not present

## 2020-05-27 DIAGNOSIS — Z7189 Other specified counseling: Secondary | ICD-10-CM

## 2020-05-27 LAB — CBC WITH DIFFERENTIAL (CANCER CENTER ONLY)
Abs Immature Granulocytes: 0.01 10*3/uL (ref 0.00–0.07)
Basophils Absolute: 0 10*3/uL (ref 0.0–0.1)
Basophils Relative: 0 %
Eosinophils Absolute: 0.1 10*3/uL (ref 0.0–0.5)
Eosinophils Relative: 2 %
HCT: 31.4 % — ABNORMAL LOW (ref 36.0–46.0)
Hemoglobin: 10.4 g/dL — ABNORMAL LOW (ref 12.0–15.0)
Immature Granulocytes: 0 %
Lymphocytes Relative: 38 %
Lymphs Abs: 1.3 10*3/uL (ref 0.7–4.0)
MCH: 31.8 pg (ref 26.0–34.0)
MCHC: 33.1 g/dL (ref 30.0–36.0)
MCV: 96 fL (ref 80.0–100.0)
Monocytes Absolute: 0.1 10*3/uL (ref 0.1–1.0)
Monocytes Relative: 3 %
Neutro Abs: 2 10*3/uL (ref 1.7–7.7)
Neutrophils Relative %: 57 %
Platelet Count: 166 10*3/uL (ref 150–400)
RBC: 3.27 MIL/uL — ABNORMAL LOW (ref 3.87–5.11)
RDW: 19.5 % — ABNORMAL HIGH (ref 11.5–15.5)
WBC Count: 3.5 10*3/uL — ABNORMAL LOW (ref 4.0–10.5)
nRBC: 0.6 % — ABNORMAL HIGH (ref 0.0–0.2)

## 2020-05-27 LAB — COMPREHENSIVE METABOLIC PANEL
ALT: 18 U/L (ref 0–44)
AST: 15 U/L (ref 15–41)
Albumin: 3.6 g/dL (ref 3.5–5.0)
Alkaline Phosphatase: 114 U/L (ref 38–126)
Anion gap: 10 (ref 5–15)
BUN: 16 mg/dL (ref 6–20)
CO2: 31 mmol/L (ref 22–32)
Calcium: 9.1 mg/dL (ref 8.9–10.3)
Chloride: 100 mmol/L (ref 98–111)
Creatinine, Ser: 0.72 mg/dL (ref 0.44–1.00)
GFR calc Af Amer: >60 mL/min (ref >60)
GFR calc non Af Amer: >60 mL/min (ref >60)
Glucose, Bld: 125 mg/dL — ABNORMAL HIGH (ref 70–99)
Potassium: 3.5 mmol/L (ref 3.5–5.1)
Sodium: 141 mmol/L (ref 135–145)
Total Bilirubin: 0.5 mg/dL (ref 0.3–1.2)
Total Protein: 6.2 g/dL — ABNORMAL LOW (ref 6.5–8.1)

## 2020-05-27 LAB — HEPATITIS C ANTIBODY: HCV Ab: NONREACTIVE

## 2020-05-27 LAB — HEPATITIS B CORE ANTIBODY, IGM: Hep B C IgM: NONREACTIVE

## 2020-05-27 LAB — HEPATITIS B SURFACE ANTIGEN: Hepatitis B Surface Ag: NONREACTIVE

## 2020-05-27 MED ORDER — PROCHLORPERAZINE MALEATE 10 MG PO TABS
10.0000 mg | ORAL_TABLET | Freq: Once | ORAL | Status: AC
  Administered 2020-05-27: 10 mg via ORAL

## 2020-05-27 MED ORDER — THALIDOMIDE 100 MG PO CAPS: 100.0000 mg | ORAL_CAPSULE | Freq: Every day | ORAL | 8 refills | Status: DC

## 2020-05-27 MED ORDER — DEXAMETHASONE 4 MG PO TABS
ORAL_TABLET | ORAL | Status: AC
  Filled 2020-05-27: qty 9

## 2020-05-27 MED ORDER — WARFARIN SODIUM 5 MG PO TABS: 5.0000 mg | ORAL_TABLET | Freq: Every day | ORAL | 3 refills | Status: DC

## 2020-05-27 MED ORDER — DEXAMETHASONE 4 MG PO TABS
40.0000 mg | ORAL_TABLET | Freq: Once | ORAL | Status: AC
  Administered 2020-05-27: 40 mg via ORAL

## 2020-05-27 MED ORDER — DEXAMETHASONE 4 MG PO TABS
ORAL_TABLET | ORAL | Status: AC
  Filled 2020-05-27: qty 1

## 2020-05-27 MED ORDER — PROCHLORPERAZINE MALEATE 10 MG PO TABS
ORAL_TABLET | ORAL | Status: AC
  Filled 2020-05-27: qty 1

## 2020-05-27 MED ORDER — BORTEZOMIB CHEMO SQ INJECTION 3.5 MG (2.5MG/ML)
1.3000 mg/m2 | Freq: Once | INTRAMUSCULAR | Status: AC
  Administered 2020-05-27: 2.25 mg via SUBCUTANEOUS
  Filled 2020-05-27: qty 0.9

## 2020-05-27 NOTE — Patient Instructions (Signed)
[facility] Discharge Instructions for Patients Receiving Chemotherapy  Today you received the following chemotherapy agents: bortezomib.  To help prevent nausea and vomiting after your treatment, we encourage you to take your nausea medication as directed.   If you develop nausea and vomiting that is not controlled by your nausea medication, call the clinic.   BELOW ARE SYMPTOMS THAT SHOULD BE REPORTED IMMEDIATELY:  *FEVER GREATER THAN 100.5 F  *CHILLS WITH OR WITHOUT FEVER  NAUSEA AND VOMITING THAT IS NOT CONTROLLED WITH YOUR NAUSEA MEDICATION  *UNUSUAL SHORTNESS OF BREATH  *UNUSUAL BRUISING OR BLEEDING  TENDERNESS IN MOUTH AND THROAT WITH OR WITHOUT PRESENCE OF ULCERS  *URINARY PROBLEMS  *BOWEL PROBLEMS  UNUSUAL RASH Items with * indicate a potential emergency and should be followed up as soon as possible.  Feel free to call the clinic should you have any questions or concerns. The clinic phone number is [phone].  Please show the CHEMO ALERT CARD at check-in to the Emergency Department and triage nurse.   

## 2020-05-27 NOTE — Progress Notes (Signed)
Robin Wilson  [phone] [phone]    ID: [name] DOB: [birth year]  MR#: [id number]  KZS#:[id number]  Patient Care Team: Maurice Small, MD as PCP - General (Family Medicine) Emonii Wienke, Virgie Dad, MD as Consulting Physician (Oncology) Latanya Maudlin, MD as Consulting Physician (Orthopedic Surgery) Everlene Farrier, MD as Consulting Physician (Obstetrics and Gynecology) Domingo Pulse, MD (Urology) Ronald Lobo, MD as Consulting Physician (Gastroenterology) Markus Daft, MD as Consulting Physician (Interventional Radiology) Erline Levine, MD as Consulting Physician (Neurosurgery) OTHER MD: Algis Liming. Lambird MD]  CHIEF COMPLAINT: Light chain myeloma  CURRENT TREATMENT: Bortezomib, dexamethasone; thalidomide; denosumab/Xgeva   INTERVAL HISTORY: Robin Wilson returns today for follow up of her myeloma accompanied by her husband.   She was scheduled to begin daratumumab today, in addition to her Bortezomib and Dexamethasone which were started 03/31/2020.  However insurance has denied this.  The reviewing physician that I spoke with says that she is still in her first line treatment even though we have to make a change since she could not tolerate lenalidomide, because we did not document a failure of the treatment.  Daratumumab of course is approved second line.  He said that they would approve thalidomide.  He has sent a denial letter.  We plan to appeal.  We are following the free kappa light chains in the urine and the kappa/lambda ratio in the urine Free Kappa Lt Chains,Ur 0.63 - 113.79 mg/L 4,845.18High  [id number]     Lab Results  Component Value Date   KAPLAMBRATIO 2,329.41 (H) 05/20/2020   KAPLAMBRATIO 7,322.02 (H) 04/23/2020   KAPLAMBRATIO 366.03 (H) 04/15/2020   KAPLAMBRATIO 5,427.06 (H) 03/24/2020   KAPLAMBRATIO 4,180.24 (H) 03/24/2020   She received her first dose of denosumab/Xgeva on 05/06/2020, with no unusual side effects.  This will  be repeated every 4 weeks.   REVIEW OF SYSTEMS: Cecilia will finally have her kyphoplasty at [facility] 06/02/2020.  I am hopeful that will make a big difference to her pain and energy but she is considering taking time off from work given the entire stress she is going through.  She has found that her former neighbor of hers now living in [place] was diagnosed with multiple myeloma 2 months ago and is being treated very similarly.  She has joined that support group and despite the time change is really learning a lot and enjoying it.  She continues to lose weight and this is a concern.  Aside from these issues a detailed review of systems today was stable.   HISTORY OF CURRENT ILLNESS: From the original intake note:  Robin MATSUO is a 59 y.o. woman that presented with low and thoracic back pain to Dr. Latanya Maudlin. She notes muscle spasms and a low grade fever of 100 degrees.   Labs completed on 02/19/2020 revealed a CBC that was within normal limits except for RBC 2.39, Hgb 8.8, HCT 26.3, MCV 110.0, MCH 36.8, RDW 17.7, AMC 91. CMP was within normal limits except for potassium 3.3, CO2 35. Uric Acid was within normal limits. Urine Protein was abnormal at 57. C-reactive protein was within normal limits. ANA screening was negative.   Thorasic Spine MRI on 02/22/2020 revealed diffuse marrow T1 hypointensity involving vertibral bodies and some spinous processes associated with mild STTR hyperintensity with mild wedge compression fracture of T7 and mild right superior endplate compression fracture of T11 that are suspicious for pathologic fractures. There is mild central zone narrowing at mid T7 with minimal-deformity of the  cord. Differential considerationsinclude Lymphoma, multiple myeloma, and diffuse metastatic disease. There is partially visualized mild-moderate spinal canal stenosis and mild deformity of the cord at C5-6 and C6-7 probable moderate-severe to severe-neutral foraminal narrowing  which would be better assessed dedicated MRI of the cervical spine as clinically warranted.   Lab Results  Component Value Date   TOTALPROTELP 6.1 03/05/2020    Lab Results  Component Value Date   KPAFRELGTCHN 919.4 (H) 03/05/2020   LAMBDASER 7.0 03/05/2020   KAPLAMBRATIO 131.34 (H) 03/05/2020   Of note she had a CT of the abdomen on 01/03/2017 at [facility] which showed a well-defined sclerotic lesion in the left iliac bone consistent with a bone island and no lytic or sclerotic lesions anywhere else.   PAST MEDICAL HISTORY: Past Medical History:  Diagnosis Date  . Hypertension   . Kidney stones   . Kidney stones     PAST SURGICAL HISTORY: Past Surgical History:  Procedure Laterality Date  . CESAREAN SECTION    . CYSTOSCOPY WITH RETROGRADE PYELOGRAM, URETEROSCOPY AND STENT PLACEMENT Right 09/16/2015   Procedure: CYSTOSCOPY WITH RETROGRADE PYELOGRAM, URETEROSCOPY AND STENT PLACEMENT WITH HOLMIUM LASER ABLATION ;  Surgeon: Alexis Frock, MD;  Location: [facility];  Service: Urology;  Laterality: Right;  . CYSTOSCOPY/RETROGRADE/URETEROSCOPY  06/04/2012   Procedure: CYSTOSCOPY/RETROGRADE/URETEROSCOPY;  Surgeon: Ailene Rud, MD;  Location: [facility];  Service: Urology;  Laterality: Left;  . IR RADIOLOGIST EVAL & MGMT  04/29/2020  . UTERINE FIBROID EMBOLIZATION      FAMILY HISTORY: No family history on file. The patient has 1 brother, 2 sisters.  Both her parents died in their 61s.  There is no cancer history in the family to her knowledge   GYNECOLOGIC HISTORY:  No LMP recorded. Patient has had an ablation. 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  Her 2 sons are from her first marriage, Robin Wilson who is a Administrator, arts at [employer] who  graduated from [employer] state in Merchandiser, retail.  In 2016 she married her second husband, [name].  He is a Loss adjuster, chartered for [employer].  He has 3 children of his own living in [place] and [place].    The patient attends [employer] DIRECTIVES: In the absence of any documents to the contrary the patient's husband is her healthcare power of attorney   HEALTH MAINTENANCE: Social History   Tobacco Use  . Smoking status: Never Smoker  . Smokeless tobacco: Never Used  Substance Use Topics  . Alcohol use: No  . Drug use: No    Colonoscopy: Due  PAP: Up-to-date  Bone density: Osteopenia Mammography: Up-to-date  Allergies  Allergen Reactions  . Sulfa Drugs Cross Reactors     From childhood per pt's mother , pt is unaware of allergy status    Current Outpatient Medications  Medication Sig Dispense Refill  . albuterol (PROVENTIL HFA;VENTOLIN HFA) 108 (90 Base) MCG/ACT inhaler Inhale 1-2 puffs into the lungs every 6 (six) hours as needed for wheezing or shortness of breath.    Marland Kitchen aspirin EC 325 MG tablet Take 1 tablet (325 mg total) by mouth daily. 30 tablet 0  . calcium carbonate (OS-CAL - DOSED IN MG OF ELEMENTAL CALCIUM) 1250 MG tablet Take 1 tablet by mouth daily.      Marland Kitchen  cetirizine (ZYRTEC) 10 MG tablet Take 10 mg by mouth daily.     . hydrOXYzine (ATARAX/VISTARIL) 10 MG tablet Take 1 tablet (10 mg total) by mouth every 4 (four) hours as needed for anxiety. 30 tablet 0  . ibuprofen (ADVIL) 200 MG tablet Take 400 mg by mouth every 6 (six) hours as needed for moderate pain.    Marland Kitchen losartan-hydrochlorothiazide (HYZAAR) 100-25 MG per tablet Take 1 tablet by mouth daily.     . Multiple Vitamins-Minerals (MULTIVITAMIN PO) Take 1 tablet by mouth daily.     . ondansetron (ZOFRAN) 4 MG tablet Take 1 tablet (4 mg total) by mouth every 6 (six) hours. 30 tablet 0  . oxyCODONE (OXY IR/ROXICODONE) 5 MG immediate release tablet Take 1-2 tablets (5-10 mg total) by mouth every 6 (six) hours as  needed for severe pain. 60 tablet 0  . potassium chloride (KLOR-CON) 10 MEQ tablet Take 2 tablets (20 mEq total) by mouth 2 (two) times daily. 120 tablet 0  . promethazine (PHENERGAN) 25 MG tablet Take 1 tablet (25 mg total) by mouth every 6 (six) hours as needed for nausea. 12 tablet 0  . senna-docusate (SENOKOT-S) 8.6-50 MG tablet Take 1 tablet by mouth 2 (two) times daily. While taking pain meds to prevent constipation 30 tablet 0  . sertraline (ZOLOFT) 50 MG tablet Take 75 mg by mouth daily.    . thalidomide (THALOMID) 100 MG capsule Take 1 capsule (100 mg total) by mouth at bedtime. [employer] Auth # [id number]    Date Obtained 05/27/2020 14 capsule 8  . traZODone (DESYREL) 50 MG tablet Take 50 mg by mouth at bedtime.    . valACYclovir (VALTREX) 1000 MG tablet Take 1 tablet (1,000 mg total) by mouth 2 (two) times daily. (Patient taking differently: Take 1,000 mg by mouth daily. ) 30 tablet 6   No current facility-administered medications for this visit.    OBJECTIVE: White woman who appears younger than stated age  67:   05/27/20 1400  BP: 119/77  Pulse: 99  Resp: 19  Temp: 97.7 F (36.5 C)  SpO2: 97%   Wt Readings from Last 3 Encounters:  05/27/20 122 lb 11.2 oz (55.7 kg)  05/20/20 124 lb (56.2 kg)  05/13/20 124 lb 3.2 oz (56.3 kg)   Body mass index is 17.61 kg/m.    ECOG FS:1 - Symptomatic but completely ambulatory  Sclerae unicteric, EOMs intact Wearing a mask No cervical or supraclavicular adenopathy Lungs no rales or rhonchi Heart regular rate and rhythm Abd soft, nontender, positive bowel sounds MSK no focal spinal tenderness, no upper extremity lymphedema Neuro: nonfocal, well oriented, appropriate affect Breasts: Deferred   LAB RESULTS:  CMP     Component Value Date/Time   NA 141 05/27/2020 1428   K 3.5 05/27/2020 1428   CL 100 05/27/2020 1428   CO2 31 05/27/2020 1428   GLUCOSE 125 (H) 05/27/2020 1428   BUN 16 05/27/2020 1428   CREATININE 0.72  05/27/2020 1428   CALCIUM 9.1 05/27/2020 1428   PROT 6.2 (L) 05/27/2020 1428   ALBUMIN 3.6 05/27/2020 1428   AST 15 05/27/2020 1428   ALT 18 05/27/2020 1428   ALKPHOS 114 05/27/2020 1428   BILITOT 0.5 05/27/2020 1428   GFRNONAA >60 05/27/2020 1428   GFRAA >60 05/27/2020 1428    Lab Results  Component Value Date   TOTALPROTELP 5.7 (L) 04/15/2020     Lab Results  Component Value Date   KPAFRELGTCHN 1,244.5 (H) 04/15/2020  LAMBDASER 3.4 (L) 04/15/2020   KAPLAMBRATIO 2,329.41 (H) 05/20/2020    Lab Results  Component Value Date   WBC 3.5 (L) 05/27/2020   NEUTROABS 2.0 05/27/2020   HGB 10.4 (L) 05/27/2020   HCT 31.4 (L) 05/27/2020   MCV 96.0 05/27/2020   PLT 166 05/27/2020    No results found for: LABCA2  No components found for: YPPJKD326  No results for input(s): INR in the last 168 hours.  No results found for: LABCA2  No results found for: ZTI458  No results found for: KDX833  No results found for: ASN053  No results found for: CA2729  No components found for: HGQUANT  No results found for: CEA1 / No results found for: CEA1   No results found for: AFPTUMOR  No results found for: CHROMOGRNA  No results found for: HGBA, HGBA2QUANT, HGBFQUANT, HGBSQUAN (Hemoglobinopathy evaluation)   Lab Results  Component Value Date   LDH 240 (H) 04/08/2020    No results found for: IRON, TIBC, IRONPCTSAT (Iron and TIBC)  No results found for: FERRITIN  Urinalysis    Component Value Date/Time   COLORURINE YELLOW 09/15/2015 2315   APPEARANCEUR CLOUDY (A) 09/15/2015 2315   LABSPEC 1.014 09/15/2015 2315   PHURINE 8.5 (H) 09/15/2015 2315   GLUCOSEU NEGATIVE 09/15/2015 2315   HGBUR SMALL (A) 09/15/2015 2315   BILIRUBINUR NEGATIVE 09/15/2015 2315   KETONESUR 15 (A) 09/15/2015 2315   PROTEINUR NEGATIVE 09/15/2015 2315   UROBILINOGEN 1.0 05/24/2013 1913   NITRITE NEGATIVE 09/15/2015 2315   LEUKOCYTESUR TRACE (A) 09/15/2015 2315    STUDIES:  MR TOTAL SPINE  METS SCREENING  Result Date: 05/07/2020 CLINICAL DATA:  Multiple myeloma, severe back spasms EXAM: MRI TOTAL SPINE WITHOUT AND WITH CONTRAST TECHNIQUE: Multisequence MR imaging of the spine from the cervical spine to the sacrum was performed prior to and following IV contrast administration for evaluation of spinal metastatic disease. CONTRAST:  3m GADAVIST GADOBUTROL 1 MMOL/ML IV SOLN COMPARISON:  Outside MRI 02/22/2020 FINDINGS: Sagittal imaging of the spine performed in upper and lower stations. Axial images were obtained of the lower cervical spine. MRI CERVICAL SPINE Alignment: Stable including mild retrolisthesis at C5-C6 and C6-C7. Vertebrae: Stable vertebral body heights. Abnormal marrow signal reflecting known myeloma. No significant epidural or foraminal extension. Cord: No abnormal signal.  No abnormal intrathecal enhancement. Posterior Fossa, vertebral arteries, paraspinal tissues: Unremarkable. Disc levels: Multilevel degenerative changes are present, greatest at C5-C6 and C6-C7. There is moderate to severe canal stenosis at these levels. However, this is unchanged from the prior study. Bilateral foraminal stenosis is also present. MRI THORACIC SPINE Alignment:  Stable. Vertebrae: Increased pathologic compression deformity of T7 without significant canal compromise. Abnormal marrow signal reflecting known myeloma. There is mild extraosseous extension about the left T3 posterior elements. No significant epidural or foraminal extension. Cord:  No abnormal signal.  No abnormal intrathecal enhancement. Paraspinal and other soft tissues: Unremarkable. Disc levels: No significant degenerative canal or foraminal stenosis. MRI LUMBAR SPINE Segmentation:  Standard. Alignment:  Preserved. Vertebrae: No compression deformity. Abnormal marrow signal reflecting known myeloma. Conus medullaris: Extends to the L1 level and appears normal. Paraspinal and other soft tissues: Unremarkable. Disc levels: Disc bulge with  endplate osteophytic ridging at L5-S1. No high-grade degenerative stenosis. IMPRESSION: Diffusely abnormal osseous marrow signal reflecting known myeloma. Increased pathologic compression deformity of T7 without significant canal compromise. No significant epidural disease or cord compression. Electronically Signed   By: PMacy MisM.D.   On: 05/07/2020 08:20  IR Radiologist Eval & Mgmt  Result Date: 04/29/2020 Please refer to notes tab for details about interventional procedure. (Op Note)    ELIGIBLE FOR AVAILABLE RESEARCH PROTOCOL: no   ASSESSMENT: 59 y.o. [place] woman presenting June 2021 with severe back pain, thoracic MRI 02/22/2020 suggestive of possible myeloma.  Work-up included:  (a) 03/05/2020 Myeloma panel shows no M protein but all immunoglobulins are decreased.  The lambda ratio is markedly abnormal at 131.34; review of the blood film is not diagnostic  (b) 24-hour urine: markedly elevated Kappa light chains at 9,656; urine kappa/lambda ratio 4180.24, and urine M spike of 239.  (c) bone survey on 03/16/2020 shows rounded lucencies throughout the skull which could be venous lakes  (d) bone marrow biopsy on 7/27 confirms multiple myeloma with 44% plasma cells on the aspirate by manual differential, and 70 to 80% on the biopsy by CD138 immunohistochemistry  (e) cytogenetics  03/24/2020: no metaphase cells available for analysis  (f) myeloma FISH studies 03/24/2020 show t(11,14), with NO del(17p), t(1/14) or t(14.16)  (g) on 04/08/2020 LDH 240, beta-2-microglobulin 4.4, albumin 4.2  (h) R-ISS Stage: II   (1) bortezomib/dexamethasone started 03/31/2020, discontinued after 05/20/2020 dose  (a) Lenalidomide added on 04/21/2020, stopped on 8/26 after two doses secondary to rash, resumed 04/29/2020 again with a very poor tolerance, treated with Medrol Dosepak, third attempt 05/14/2020, again unable to tolerate   (b) originally on ASA and acyclovir for supportive care,  changed September 2021  (2) denosumab/Xgeva started 05/06/2020  (3) to add thalidomide to bortezomib/ dexamethasone 05/27/2020  (a) to start warfarin after planned kyphoplasty, continuing Valtrex 1000 mg daily     PLAN: Avamarie received her ninth dose of bortezomib and dexamethasone today.  This is essentially all she has received to date because she was not able to tolerate the lenalidomide despite 3 attempts.  We were going to start daratumumab which was suggested by Dr. Aris Lot and we had discussed that in detail but this was refused by her insurance today.  They are approving thalidomide.  I do not know if she will be able to tolerate that any better than she was able to tolerate the lenalidomide and if she does not then there will be further delays in getting into remission.  I let the reviewing physician know that I think it is inappropriate for them not to approve a drug that was suggested by 2 separate physicians treating this patient and which is clearly indicated in my opinion given the fact that she did not tolerate her first line treatment with lenalidomide.  The issue is not what is the technical definition of first line or second line, the issue is what is best for the patient.  Today we discussed lenalidomide in detail.  She understands the possible toxicities side effects and complications.  I have suggested that she take it at bedtime.  We will have to watch for neuropathy of course as well as issues regarding constipation and  hypercoagulability.  I am starting her on warfarin after her kyphoplasty and we will be checking an INR on a weekly basis until stable. We are continuing the Valtrex at 1000 mg daily.  I will see her again 06/03/2020 and hopefully by then she will be on her new treatment regimen.  Total encounter time 35 minutes.   Virgie Dad. Brucha Ahlquist, MD 05/27/20 6:13 PM Medical Oncology and Hematology [facility] [place], [place]  [address] Tel. [phone]    Fax.  [phone]   I, Wilburn Mylar, am acting as scribe for Dr. Virgie Dad. Sayge Salvato.  I, Lurline Del MD, have reviewed the above documentation for accuracy and completeness, and I agree with the above.   *Total Encounter Time as defined by the Centers for Medicare and Medicaid Services includes, in addition to the face-to-face time of a patient visit (documented in the note above) non-face-to-face time: obtaining and reviewing outside history, ordering and reviewing medications, tests or procedures, care coordination (communications with other health care professionals or caregivers) and documentation in the medical record.

## 2020-05-27 NOTE — Progress Notes (Signed)
DISCONTINUE OFF PATHWAY REGIMEN - Multiple Myeloma and Other Plasma Cell Dyscrasias   Custom Intervention:Medical: [Bortezomib, Thalidomide, Dexamethasone]:     Bortezomib      Thalidomide      Dexamethasone   **Always confirm dose/schedule in your pharmacy ordering system**  REASON: Other Reason PRIOR TREATMENT: Off Pathway: Medical: [Bortezomib, Thalidomide, Dexamethasone] TREATMENT RESPONSE: Partial Response (PR)  START OFF PATHWAY REGIMEN - Multiple Myeloma and Other Plasma Cell Dyscrasias   [id number]:Bortezomib (SQ) + Dexamethasone + Thalidomide:   A cycle is every 21 days:     Bortezomib      Thalidomide      Dexamethasone   **Always confirm dose/schedule in your pharmacy ordering system**  Patient Characteristics: Multiple Myeloma, Newly Diagnosed, Transplant Eligible, Standard Risk Disease Classification: Multiple Myeloma R-ISS Staging: III Therapeutic Status: Newly Diagnosed Is Patient Eligible for Transplant<= Transplant Eligible Risk Status: Standard Risk Intent of Therapy: Non-Curative / Palliative Intent, Discussed with Patient

## 2020-05-27 NOTE — Progress Notes (Signed)
DISCONTINUE ON PATHWAY REGIMEN - Multiple Myeloma and Other Plasma Cell Dyscrasias     A cycle is every 21 days:     Bortezomib      Lenalidomide      Dexamethasone   **Always confirm dose/schedule in your pharmacy ordering system**  REASON: Toxicities / Adverse Event PRIOR TREATMENT: MMOS103: VRd (Bortezomib 1.3 mg/m2 SUBQ D1, 4, 8, 11 + Lenalidomide 25 mg + Dexamethasone 20 mg) q21 Days x 8 Cycles TREATMENT RESPONSE: Partial Response (PR)  START OFF PATHWAY REGIMEN - Multiple Myeloma and Other Plasma Cell Dyscrasias   Custom Intervention:Medical: [Bortezomib, Thalidomide, Dexamethasone]:     Bortezomib      Thalidomide      Dexamethasone   **Always confirm dose/schedule in your pharmacy ordering system**  Patient Characteristics: Multiple Myeloma, Newly Diagnosed, Transplant Eligible, Standard Risk Disease Classification: Multiple Myeloma R-ISS Staging: III Therapeutic Status: Newly Diagnosed Is Patient Eligible for Transplant<= Transplant Eligible Risk Status: Standard Risk Intent of Therapy: Non-Curative / Palliative Intent, Discussed with Patient

## 2020-05-27 NOTE — Progress Notes (Signed)
Give same doses as last week per Dr. Jana Hakim.  No Daratumumab today.  Insur denied. Dr. Jana Hakim may appeal for Daratumumab.  Kennith Center, Pharm.D., CPP 05/27/2020@4 :06 PM

## 2020-05-27 NOTE — Telephone Encounter (Signed)
This RN enrolled pt in [facility] for thalidomide ( pt stopping lenlidomide ) and sent request per MD for 14 day supply per use of 1 week on and 1 week off regimen.  Contacted by [facility] per above prior to dispensing for verification of dosing per medication comes in blister pack that cannot be broken or cut.  Pt will receive a 2 month supply but MD will have to do authorization in 1 month - [facility] will contact this office when needed.

## 2020-05-28 ENCOUNTER — Telehealth: Payer: Self-pay | Admitting: Pharmacist

## 2020-05-28 ENCOUNTER — Telehealth: Payer: Self-pay

## 2020-05-28 ENCOUNTER — Other Ambulatory Visit: Payer: Self-pay | Admitting: *Deleted

## 2020-05-28 DIAGNOSIS — C9000 Multiple myeloma not having achieved remission: Secondary | ICD-10-CM

## 2020-05-28 MED ORDER — THALIDOMIDE 100 MG PO CAPS: 100.0000 mg | ORAL_CAPSULE | Freq: Every day | ORAL | 8 refills | Status: DC

## 2020-05-28 MED ORDER — THALIDOMIDE 100 MG PO CAPS: 100.0000 mg | ORAL_CAPSULE | Freq: Every day | ORAL | 0 refills | Status: DC

## 2020-05-28 MED FILL — WARFARIN SODIUM 5 MG TABLET: 5 | 30 days supply | Qty: 30 | Fill #0

## 2020-05-28 NOTE — Telephone Encounter (Signed)
Oral Oncology Patient Advocate Encounter  Prior Authorization for Thalomid has been approved.    PA# [id number] Effective dates: 04/28/20 through 05/28/23  Patient [facility] will continue to follow.   [place] Patient [place] Phone [phone] Fax [phone] 05/28/2020 12:02 PM

## 2020-05-28 NOTE — Telephone Encounter (Signed)
Oral Oncology Pharmacist Encounter  Received new prescription for Thalomid (thalidomide) for the treatment of multiple myeloma in conjunction with Velcade and dexamethasone, planned duration until transplant eligible.  Prescription dose and frequency assessed for appropriateness. Appropriate for therapy initiation.   CMP and CBC w/ Diff from 05/27/20 assessed, labs OK for treatment initiation.  Current medication list in Epic reviewed, DDIs with Thalidomide identified:  Caution use of Thalidomide with concomitant CNS depressants, such as oxycodone, cetirizine, hydroxyzine, promethazine, and trazodone due to potential for enhanced CNS depression - will counsel patient to monitor for increased drowsiness/somnolence while on Thalidomide.   Evaluated chart and no patient barriers to medication adherence noted.   Prescription has been e-scribed to [facility] given it is a limited distribution medication.  Oral Oncology Clinic will continue to follow for insurance authorization, copayment issues, initial counseling and start date.  Leron Croak, PharmD, BCPS Hematology/Oncology Clinical Pharmacist [facility] [phone] 05/28/2020 8:46 AM

## 2020-05-28 NOTE — Telephone Encounter (Signed)
Oral Oncology Patient Advocate Encounter  Received notification from [facility] that prior authorization for Thalomid is required.  PA submitted on CoverMyMeds Key [id number] Status is pending  Oral Oncology Clinic will continue to follow.   [place] Patient [place] Phone [phone] Fax [phone] 05/28/2020 9:06 AM

## 2020-05-28 NOTE — Telephone Encounter (Signed)
This RN spoke with [facility] per need to change dispense amount to 28 tablets due to how Thalidomide is packaged.

## 2020-05-29 ENCOUNTER — Telehealth: Payer: Self-pay | Admitting: Oncology

## 2020-05-29 ENCOUNTER — Other Ambulatory Visit: Payer: Self-pay | Admitting: Adult Health

## 2020-05-29 ENCOUNTER — Other Ambulatory Visit: Payer: Self-pay | Admitting: *Deleted

## 2020-05-29 DIAGNOSIS — C9000 Multiple myeloma not having achieved remission: Secondary | ICD-10-CM

## 2020-05-29 DIAGNOSIS — S22060G Wedge compression fracture of T7-T8 vertebra, subsequent encounter for fracture with delayed healing: Secondary | ICD-10-CM | POA: Diagnosis not present

## 2020-05-29 MED ORDER — OXYCODONE HCL 5 MG PO TABS: 5.0000 mg | ORAL_TABLET | Freq: Four times a day (QID) | ORAL | 0 refills | Status: DC | PRN

## 2020-05-29 NOTE — Telephone Encounter (Signed)
No 9/29 los. No changes made to pt's schedule.

## 2020-05-29 NOTE — Progress Notes (Signed)
Refilling patient oxycodone based on her pain management regimen for her multiple myeloma with bone involvement.  PMP aware reviewed, refills appropriate, giving #120 so she is not having to get filled so frequently.  Dr. Jana Hakim aware and in agreement.  Wilber Bihari, NP

## 2020-06-02 NOTE — Progress Notes (Signed)
[place]  Telephone:([phone]) [phone] Fax:(336) [phone]    ID: Robin Wilson DOB: 12-13-1960  MR#: [id number]  BWG#:[id number]  Patient Care Team: Maurice Small, MD as PCP - General (Family Medicine) Raeley Gilmore, Virgie Dad, MD as Consulting Physician (Oncology) Latanya Maudlin, MD as Consulting Physician (Orthopedic Surgery) Everlene Farrier, MD as Consulting Physician (Obstetrics and Gynecology) Domingo Pulse, MD (Urology) Ronald Lobo, MD as Consulting Physician (Gastroenterology) Markus Daft, MD as Consulting Physician (Interventional Radiology) Erline Levine, MD as Consulting Physician (Neurosurgery) Aris Lot Ramon Dredge, MD as Referring Physician (Hematology and Oncology) OTHER MD: Algis Liming. Lambird MD]  CHIEF COMPLAINT: Light chain myeloma  CURRENT TREATMENT: Bortezomib, dexamethasone; thalidomide; denosumab/Xgeva   INTERVAL HISTORY: [name] returns today for follow up of her myeloma accompanied by her sister  [name] was switched to thalidomide at her last visit on 05/27/2020 due to her insurance denying daratumumab.  She however has not yet received the medication.  She has been told that she will receive it tomorrow.  She has been instructed on side effects both by me, our oral chemotherapy pharmacy specialists, and also the provider pharmacy.  We are following the free kappa light chains in the urine and the kappa/lambda ratio in the urine Free Kappa Lt Chains,Ur 0.63 - 113.79 mg/L 4,845.18High  [id number]     Lab Results  Component Value Date   KAPLAMBRATIO 2,329.41 (H) 05/20/2020   KAPLAMBRATIO 1,779.39 (H) 04/23/2020   KAPLAMBRATIO 366.03 (H) 04/15/2020   KAPLAMBRATIO 0,300.92 (H) 03/24/2020   KAPLAMBRATIO 4,180.24 (H) 03/24/2020   She received her first dose of denosumab/Xgeva on 05/06/2020, with no unusual side effects.  This will be repeated every 4 weeks.   REVIEW OF SYSTEMS: Justiss tells me she contacted her insurance company and let them  know she was very displeased that the opinion of her 2 physicians was overridden.  We certainly plan to appeal.  In the meantime she is feeling very tired and she is taking next week off.  She continues to have pain of course and is scheduled for kyphoplasty at [facility] 06/09/2020.  Hopefully after that the pain issue will improve and she will have a little bit more energy but at this point she is considering going on disability for several months.  2 this point she has had absolutely no peripheral neuropathy symptoms.   HISTORY OF CURRENT ILLNESS: From the original intake note:  FRAYA Wilson is a 59 y.o. woman that presented with low and thoracic back pain to Dr. Latanya Maudlin. She notes muscle spasms and a low grade fever of 100 degrees.   Labs completed on 02/19/2020 revealed a CBC that was within normal limits except for RBC 2.39, Hgb 8.8, HCT 26.3, MCV 110.0, MCH 36.8, RDW 17.7, AMC 91. CMP was within normal limits except for potassium 3.3, CO2 35. Uric Acid was within normal limits. Urine Protein was abnormal at 57. C-reactive protein was within normal limits. ANA screening was negative.   Thorasic Spine MRI on 02/22/2020 revealed diffuse marrow T1 hypointensity involving vertibral bodies and some spinous processes associated with mild STTR hyperintensity with mild wedge compression fracture of T7 and mild right superior endplate compression fracture of T11 that are suspicious for pathologic fractures. There is mild central zone narrowing at mid T7 with minimal-deformity of the cord. Differential considerationsinclude Lymphoma, multiple myeloma, and diffuse metastatic disease. There is partially visualized mild-moderate spinal canal stenosis and mild deformity of the cord at C5-6 and C6-7 probable moderate-severe to severe-neutral foraminal narrowing  which would be better assessed dedicated MRI of the cervical spine as clinically warranted.   Lab Results  Component Value Date   TOTALPROTELP  6.1 03/05/2020    Lab Results  Component Value Date   KPAFRELGTCHN 919.4 (H) 03/05/2020   LAMBDASER 7.0 03/05/2020   KAPLAMBRATIO 131.34 (H) 03/05/2020   Of note she had a CT of the abdomen on 01/03/2017 at [facility] which showed a well-defined sclerotic lesion in the left iliac bone consistent with a bone island and no lytic or sclerotic lesions anywhere else.   PAST MEDICAL HISTORY: Past Medical History:  Diagnosis Date  . Hypertension   . Kidney stones   . Kidney stones     PAST SURGICAL HISTORY: Past Surgical History:  Procedure Laterality Date  . CESAREAN SECTION    . CYSTOSCOPY WITH RETROGRADE PYELOGRAM, URETEROSCOPY AND STENT PLACEMENT Right 09/16/2015   Procedure: CYSTOSCOPY WITH RETROGRADE PYELOGRAM, URETEROSCOPY AND STENT PLACEMENT WITH HOLMIUM LASER ABLATION ;  Surgeon: Alexis Frock, MD;  Location: [facility];  Service: Urology;  Laterality: Right;  . CYSTOSCOPY/RETROGRADE/URETEROSCOPY  06/04/2012   Procedure: CYSTOSCOPY/RETROGRADE/URETEROSCOPY;  Surgeon: Ailene Rud, MD;  Location: [facility];  Service: Urology;  Laterality: Left;  . IR RADIOLOGIST EVAL & MGMT  04/29/2020  . UTERINE FIBROID EMBOLIZATION      FAMILY HISTORY: No family history on file. The patient has 1 brother, 2 sisters.  Both her parents died in their 22s.  There is no cancer history in the family to her knowledge   GYNECOLOGIC HISTORY:  No LMP recorded. Patient has had an ablation. 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  Her 2 sons are from her first marriage, [name] who is a Administrator, arts at [employer] who graduated from [employer] in Merchandiser, retail.  In 2016 she married her second husband, [name].  He is a Loss adjuster, chartered for [employer].  He has 3 children of his own  living in [place] and [place].    The patient attends [employer] DIRECTIVES: In the absence of any documents to the contrary the patient's husband is her healthcare power of attorney   HEALTH MAINTENANCE: Social History   Tobacco Use  . Smoking status: Never Smoker  . Smokeless tobacco: Never Used  Substance Use Topics  . Alcohol use: No  . Drug use: No    Colonoscopy: Due  PAP: Up-to-date  Bone density: Osteopenia Mammography: Up-to-date  Allergies  Allergen Reactions  . Sulfa Drugs Cross Reactors     From childhood per pt's mother , pt is unaware of allergy status    Current Outpatient Medications  Medication Sig Dispense Refill  . albuterol (PROVENTIL HFA;VENTOLIN HFA) 108 (90 Base) MCG/ACT inhaler Inhale 1-2 puffs into the lungs every 6 (six) hours as needed for wheezing or shortness of breath.    Marland Kitchen aspirin EC 325 MG tablet Take 1 tablet (325 mg total) by mouth daily. 30 tablet 0  . calcium carbonate (OS-CAL - DOSED IN MG OF ELEMENTAL CALCIUM) 1250 MG tablet Take 1 tablet by mouth daily.      . cetirizine (ZYRTEC) 10 MG tablet Take 10 mg by mouth daily.     Marland Kitchen dexamethasone (DECADRON) 4 MG tablet Take 10 tablets (40 mg total) by mouth  once.    . hydrOXYzine (ATARAX/VISTARIL) 10 MG tablet Take 1 tablet (10 mg total) by mouth every 4 (four) hours as needed for anxiety. 30 tablet 0  . ibuprofen (ADVIL) 200 MG tablet Take 400 mg by mouth every 6 (six) hours as needed for moderate pain.    Marland Kitchen losartan-hydrochlorothiazide (HYZAAR) 100-25 MG per tablet Take 1 tablet by mouth daily.     . Multiple Vitamins-Minerals (MULTIVITAMIN PO) Take 1 tablet by mouth daily.     . ondansetron (ZOFRAN) 4 MG tablet Take 1 tablet (4 mg total) by mouth every 6 (six) hours. 30 tablet 0  . oxyCODONE (OXY IR/ROXICODONE) 5 MG immediate release tablet Take 1-2 tablets (5-10 mg total) by mouth every 6 (six) hours as needed for severe pain. 120 tablet 0  . potassium chloride  (KLOR-CON) 10 MEQ tablet Take 2 tablets (20 mEq total) by mouth 2 (two) times daily. 120 tablet 0  . promethazine (PHENERGAN) 25 MG tablet Take 1 tablet (25 mg total) by mouth every 6 (six) hours as needed for nausea. 12 tablet 0  . senna-docusate (SENOKOT-S) 8.6-50 MG tablet Take 1 tablet by mouth 2 (two) times daily. While taking pain meds to prevent constipation 30 tablet 0  . sertraline (ZOLOFT) 50 MG tablet Take 75 mg by mouth daily.    . thalidomide (THALOMID) 100 MG capsule Take 1 capsule (100 mg total) by mouth at bedtime. Take 14 days on, 7 days off, repeat every 21 days. [employer] Auth # [id number]; Date Obtained 05/27/2020 28 capsule 0  . traZODone (DESYREL) 50 MG tablet Take 50 mg by mouth at bedtime.    . valACYclovir (VALTREX) 1000 MG tablet Take 1 tablet (1,000 mg total) by mouth 2 (two) times daily. (Patient taking differently: Take 1,000 mg by mouth daily. ) 30 tablet 6  . warfarin (COUMADIN) 5 MG tablet Take 1 tablet (5 mg total) by mouth daily. 30 tablet 3   No current facility-administered medications for this visit.    OBJECTIVE: White woman who appears younger than stated age  37:   06/03/20 1321  BP: 125/80  Pulse: 97  Resp: 20  Temp: 97.8 F (36.6 C)  SpO2: 100%   Wt Readings from Last 3 Encounters:  06/03/20 122 lb 11.2 oz (55.7 kg)  05/27/20 122 lb 11.2 oz (55.7 kg)  05/20/20 124 lb (56.2 kg)   Body mass index is 17.61 kg/m.    ECOG FS:1 - Symptomatic but completely ambulatory  Sclerae unicteric, EOMs intact Wearing a mask No cervical or supraclavicular adenopathy Lungs no rales or rhonchi Heart regular rate and rhythm Abd soft, nontender, positive bowel sounds MSK no focal spinal tenderness, no upper extremity lymphedema Neuro: nonfocal, well oriented, appropriate affect Breasts: Deferred   LAB RESULTS:  CMP     Component Value Date/Time   NA 141 06/03/2020 1354   K 3.9 06/03/2020 1354   CL 104 06/03/2020 1354   CO2 34 (H) 06/03/2020 1354     GLUCOSE 88 06/03/2020 1354   BUN 18 06/03/2020 1354   CREATININE 0.63 06/03/2020 1354   CALCIUM 9.2 06/03/2020 1354   PROT 5.9 (L) 06/03/2020 1354   ALBUMIN 3.6 06/03/2020 1354   AST 16 06/03/2020 1354   ALT 17 06/03/2020 1354   ALKPHOS 108 06/03/2020 1354   BILITOT 0.5 06/03/2020 1354   GFRNONAA >60 06/03/2020 1354   GFRAA >60 05/27/2020 1428    Lab Results  Component Value Date   TOTALPROTELP 5.7 (L) 04/15/2020  Lab Results  Component Value Date   KPAFRELGTCHN 1,244.5 (H) 04/15/2020   LAMBDASER 3.4 (L) 04/15/2020   KAPLAMBRATIO 2,329.41 (H) 05/20/2020    Lab Results  Component Value Date   WBC 3.5 (L) 06/03/2020   NEUTROABS 1.9 06/03/2020   HGB 9.6 (L) 06/03/2020   HCT 28.9 (L) 06/03/2020   MCV 97.6 06/03/2020   PLT 152 06/03/2020    No results found for: LABCA2  No components found for: OQHUTM546  Recent Labs  Lab 06/03/20 1353  INR 0.9    No results found for: LABCA2  No results found for: TKP546  No results found for: FKC127  No results found for: NTZ001  No results found for: CA2729  No components found for: HGQUANT  No results found for: CEA1 / No results found for: CEA1   No results found for: AFPTUMOR  No results found for: CHROMOGRNA  No results found for: HGBA, HGBA2QUANT, HGBFQUANT, HGBSQUAN (Hemoglobinopathy evaluation)   Lab Results  Component Value Date   LDH 240 (H) 04/08/2020    No results found for: IRON, TIBC, IRONPCTSAT (Iron and TIBC)  No results found for: FERRITIN  Urinalysis    Component Value Date/Time   COLORURINE YELLOW 09/15/2015 2315   APPEARANCEUR CLOUDY (A) 09/15/2015 2315   LABSPEC 1.014 09/15/2015 2315   PHURINE 8.5 (H) 09/15/2015 2315   GLUCOSEU NEGATIVE 09/15/2015 2315   HGBUR SMALL (A) 09/15/2015 2315   BILIRUBINUR NEGATIVE 09/15/2015 2315   KETONESUR 15 (A) 09/15/2015 2315   PROTEINUR NEGATIVE 09/15/2015 2315   UROBILINOGEN 1.0 05/24/2013 1913   NITRITE NEGATIVE 09/15/2015 2315    LEUKOCYTESUR TRACE (A) 09/15/2015 2315    STUDIES:  MR TOTAL SPINE METS SCREENING  Result Date: 05/07/2020 CLINICAL DATA:  Multiple myeloma, severe back spasms EXAM: MRI TOTAL SPINE WITHOUT AND WITH CONTRAST TECHNIQUE: Multisequence MR imaging of the spine from the cervical spine to the sacrum was performed prior to and following IV contrast administration for evaluation of spinal metastatic disease. CONTRAST:  29m GADAVIST GADOBUTROL 1 MMOL/ML IV SOLN COMPARISON:  Outside MRI 02/22/2020 FINDINGS: Sagittal imaging of the spine performed in upper and lower stations. Axial images were obtained of the lower cervical spine. MRI CERVICAL SPINE Alignment: Stable including mild retrolisthesis at C5-C6 and C6-C7. Vertebrae: Stable vertebral body heights. Abnormal marrow signal reflecting known myeloma. No significant epidural or foraminal extension. Cord: No abnormal signal.  No abnormal intrathecal enhancement. Posterior Fossa, vertebral arteries, paraspinal tissues: Unremarkable. Disc levels: Multilevel degenerative changes are present, greatest at C5-C6 and C6-C7. There is moderate to severe canal stenosis at these levels. However, this is unchanged from the prior study. Bilateral foraminal stenosis is also present. MRI THORACIC SPINE Alignment:  Stable. Vertebrae: Increased pathologic compression deformity of T7 without significant canal compromise. Abnormal marrow signal reflecting known myeloma. There is mild extraosseous extension about the left T3 posterior elements. No significant epidural or foraminal extension. Cord:  No abnormal signal.  No abnormal intrathecal enhancement. Paraspinal and other soft tissues: Unremarkable. Disc levels: No significant degenerative canal or foraminal stenosis. MRI LUMBAR SPINE Segmentation:  Standard. Alignment:  Preserved. Vertebrae: No compression deformity. Abnormal marrow signal reflecting known myeloma. Conus medullaris: Extends to the L1 level and appears normal.  Paraspinal and other soft tissues: Unremarkable. Disc levels: Disc bulge with endplate osteophytic ridging at L5-S1. No high-grade degenerative stenosis. IMPRESSION: Diffusely abnormal osseous marrow signal reflecting known myeloma. Increased pathologic compression deformity of T7 without significant canal compromise. No significant epidural disease or cord compression. Electronically  Signed   By: Macy Mis M.D.   On: 05/07/2020 08:20     ELIGIBLE FOR AVAILABLE RESEARCH PROTOCOL: no   ASSESSMENT: 59 y.o. [place] woman presenting June 2021 with severe back pain, thoracic MRI 02/22/2020 suggestive of possible myeloma.  Work-up included:  (a) 03/05/2020 Myeloma panel shows no M protein but all immunoglobulins are decreased.  The lambda ratio is markedly abnormal at 131.34; review of the blood film is not diagnostic  (b) 24-hour urine: markedly elevated Kappa light chains at 9,656; urine kappa/lambda ratio 4180.24, and urine M spike of 239.  (c) bone survey on 03/16/2020 shows rounded lucencies throughout the skull which could be venous lakes  (d) bone marrow biopsy on 7/27 confirms multiple myeloma with 44% plasma cells on the aspirate by manual differential, and 70 to 80% on the biopsy by CD138 immunohistochemistry  (e) cytogenetics  03/24/2020: no metaphase cells available for analysis  (f) myeloma FISH studies 03/24/2020 show t(11,14), with NO del(17p), t(1/14) or t(14.16)  (g) on 04/08/2020 LDH 240, beta-2-microglobulin 4.4, albumin 4.2  (h) R-ISS Stage: II   (1) bortezomib/dexamethasone started 03/31/2020, discontinued after 05/20/2020 dose  (a) Lenalidomide added on 04/21/2020, stopped on 8/26 after two doses secondary to rash, resumed 04/29/2020 again with a very poor tolerance, treated with Medrol Dosepak, third attempt 05/14/2020, again unable to tolerate   (b) originally on ASA and acyclovir for supportive care, changed September 2021  (2) denosumab/Xgeva started  05/06/2020  (3) to add thalidomide to bortezomib/ dexamethasone 05/27/2020  (a) to start warfarin after planned kyphoplasty, continuing Valtrex 1000 mg daily    PLAN: Allesha will again receive dexamethasone and bortezomib today.  I note that the total protein is down while the albumin is not and that may indicate some additional evidence of response.  We will repeat a 24-hour urine which is the best way to follow her cancer when she returns to see me in 2 weeks.  She is very much looking to the kyphoplasty procedure and hopefully it will go well.  I am not going to start her on Coumadin until after that is done distally there are no complications.  With thalidomide however I prefer to go with Coumadin at 1.8/2.2 INR and she can go off aspirin at that time.  She knows to call for any other issue that may develop before the next visit  Total encounter time 25 minutes.   Virgie Dad. Arthelia Callicott, MD 06/03/20 3:54 PM Medical Oncology and Hematology [facility] [place], [place] [address] Tel. [phone]    Fax. [phone]   I, Wilburn Mylar, am acting as scribe for Dr. Virgie Dad. Luay Balding.  I, Lurline Del MD, have reviewed the above documentation for accuracy and completeness, and I agree with the above.   *Total Encounter Time as defined by the Centers for Medicare and Medicaid Services includes, in addition to the face-to-face time of a patient visit (documented in the note above) non-face-to-face time: obtaining and reviewing outside history, ordering and reviewing medications, tests or procedures, care coordination (communications with other health care professionals or caregivers) and documentation in the medical record.

## 2020-06-02 NOTE — Telephone Encounter (Signed)
Oral Chemotherapy Pharmacist Encounter   [facility] [phone]) to check status of Thalidomide. Representative stated prescription is ready to be scheduled and they will be reaching out to patient to schedule shipment.  Confirmed with Dr. Jana Hakim on 06/01/20 that patient will be taking Thalidomide 100 mg capsules, 1 capsule by mouth at bedtime for 14 days on, 7 days off, repeated every 21 days. Per MD patient may start Thalidomide once received from [facility].  Will reach out to patient to provide initial counseling and updated information regarding medication acquisition.   Leron Croak, PharmD, BCPS Hematology/Oncology Clinical Pharmacist [facility] [phone] 06/02/2020 10:37 AM

## 2020-06-02 NOTE — Telephone Encounter (Signed)
Oral Chemotherapy Pharmacist Encounter  I spoke with patient for overview of: Thalomid (thalidomide) for the treatment of multiple myeloma in conjunction with Velcade and dexamethasone, planned duration until transplant eligible or disease progression or unacceptable drug toxicity.  Counseled patient on administration, dosing, side effects, monitoring, drug-food interactions, safe handling, storage, and disposal.  Patient will take Thalomid 119m capsules, 1 capsule by mouth once daily at bedtime, at least 1 hour after evening meal.  Thalomid will be given 14 days on, 7 days off, repeat every 21 days.  Patient will take dexamethasone 459mtablets, 10 tablets (4050mby mouth once weekly on days of Velcade injection.  Thalomid start date: per MD patient may start once received from [facility]  Adverse effects of Thalomid include but are not limited to: nausea, constipation, abdominal pain, peripheral edema, peripheral neuropathy, tremor, rash, fatigue, and decreased blood counts, and seizures.    Reviewed with patient importance of keeping a medication schedule and plan for any missed doses. No barriers to medication adherence identified.  Medication reconciliation performed and medication/allergy list updated.  Discussed drug-drug interactions between Thalidomid and CNS depressant medications increasing risk for increased sedation/lethargy. Patient knows to notify MD office if this occurs.   Patient currently has valacyclovir Rx for VZV prophylaxis. Dexamethasone is given to patient weekly with Velcade injection.  Discussed the use of warfarin for DVT prophylaxis while on Thalomid. We discussed the purpose of weekly INR monitoring as well as monitoring for s/sx of increased bleeding. Patient will start warfarin after kyphoplasty, which is planned for 06/09/20.  Insurance authorization for Thalomid has been obtained.  Thalomid prescription is being dispensed from [facility]  it is a limited distribution medication.  All questions answered.  Ms. HowShaubiced understanding and appreciation.   Medication education handout placed in mail for patient. Patient knows to call the office with questions or concerns. Oral Chemotherapy Clinic phone number provided to patient.   RebLeron CroakharmD, BCPS Hematology/Oncology Clinical Pharmacist [phone]/12/2019 1:33 PM

## 2020-06-03 ENCOUNTER — Inpatient Hospital Stay: Payer: BC Managed Care – PPO | Attending: Oncology

## 2020-06-03 ENCOUNTER — Inpatient Hospital Stay: Payer: BC Managed Care – PPO

## 2020-06-03 ENCOUNTER — Other Ambulatory Visit: Payer: Self-pay

## 2020-06-03 ENCOUNTER — Inpatient Hospital Stay (HOSPITAL_BASED_OUTPATIENT_CLINIC_OR_DEPARTMENT_OTHER): Payer: BC Managed Care – PPO | Admitting: Oncology

## 2020-06-03 VITALS — BP 125/80 | HR 97 | Temp 97.8°F | Resp 20 | Ht 70.0 in | Wt 122.7 lb

## 2020-06-03 DIAGNOSIS — Z23 Encounter for immunization: Secondary | ICD-10-CM | POA: Insufficient documentation

## 2020-06-03 DIAGNOSIS — C9000 Multiple myeloma not having achieved remission: Secondary | ICD-10-CM | POA: Diagnosis not present

## 2020-06-03 DIAGNOSIS — Z5112 Encounter for antineoplastic immunotherapy: Secondary | ICD-10-CM | POA: Diagnosis not present

## 2020-06-03 DIAGNOSIS — Z7189 Other specified counseling: Secondary | ICD-10-CM

## 2020-06-03 LAB — CBC WITH DIFFERENTIAL (CANCER CENTER ONLY)
Abs Immature Granulocytes: 0.02 10*3/uL (ref 0.00–0.07)
Basophils Absolute: 0 10*3/uL (ref 0.0–0.1)
Basophils Relative: 0 %
Eosinophils Absolute: 0.1 10*3/uL (ref 0.0–0.5)
Eosinophils Relative: 3 %
HCT: 28.9 % — ABNORMAL LOW (ref 36.0–46.0)
Hemoglobin: 9.6 g/dL — ABNORMAL LOW (ref 12.0–15.0)
Immature Granulocytes: 1 %
Lymphocytes Relative: 40 %
Lymphs Abs: 1.4 10*3/uL (ref 0.7–4.0)
MCH: 32.4 pg (ref 26.0–34.0)
MCHC: 33.2 g/dL (ref 30.0–36.0)
MCV: 97.6 fL (ref 80.0–100.0)
Monocytes Absolute: 0.1 10*3/uL (ref 0.1–1.0)
Monocytes Relative: 3 %
Neutro Abs: 1.9 10*3/uL (ref 1.7–7.7)
Neutrophils Relative %: 53 %
Platelet Count: 152 10*3/uL (ref 150–400)
RBC: 2.96 MIL/uL — ABNORMAL LOW (ref 3.87–5.11)
RDW: 19.9 % — ABNORMAL HIGH (ref 11.5–15.5)
WBC Count: 3.5 10*3/uL — ABNORMAL LOW (ref 4.0–10.5)
nRBC: 0.6 % — ABNORMAL HIGH (ref 0.0–0.2)

## 2020-06-03 LAB — COMPREHENSIVE METABOLIC PANEL
ALT: 17 U/L (ref 0–44)
AST: 16 U/L (ref 15–41)
Albumin: 3.6 g/dL (ref 3.5–5.0)
Alkaline Phosphatase: 108 U/L (ref 38–126)
Anion gap: 3 — ABNORMAL LOW (ref 5–15)
BUN: 18 mg/dL (ref 6–20)
CO2: 34 mmol/L — ABNORMAL HIGH (ref 22–32)
Calcium: 9.2 mg/dL (ref 8.9–10.3)
Chloride: 104 mmol/L (ref 98–111)
Creatinine, Ser: 0.63 mg/dL (ref 0.44–1.00)
GFR calc non Af Amer: >60 mL/min (ref >60)
Glucose, Bld: 88 mg/dL (ref 70–99)
Potassium: 3.9 mmol/L (ref 3.5–5.1)
Sodium: 141 mmol/L (ref 135–145)
Total Bilirubin: 0.5 mg/dL (ref 0.3–1.2)
Total Protein: 5.9 g/dL — ABNORMAL LOW (ref 6.5–8.1)

## 2020-06-03 LAB — PROTIME-INR
INR: 0.9 (ref 0.8–1.2)
Prothrombin Time: 12.1 seconds (ref 11.4–15.2)

## 2020-06-03 MED ORDER — DEXAMETHASONE 4 MG PO TABS
40.0000 mg | ORAL_TABLET | Freq: Once | ORAL | Status: AC
  Administered 2020-06-03: 40 mg via ORAL

## 2020-06-03 MED ORDER — BORTEZOMIB CHEMO SQ INJECTION 3.5 MG (2.5MG/ML)
1.3000 mg/m2 | Freq: Once | INTRAMUSCULAR | Status: AC
  Administered 2020-06-03: 2.25 mg via SUBCUTANEOUS
  Filled 2020-06-03: qty 0.9

## 2020-06-03 MED ORDER — DEXAMETHASONE 4 MG PO TABS
ORAL_TABLET | ORAL | Status: AC
  Filled 2020-06-03: qty 10

## 2020-06-03 NOTE — Patient Instructions (Signed)
[facility] Discharge Instructions for Patients Receiving Chemotherapy  Today you received the following chemotherapy agents: bortezomib.  To help prevent nausea and vomiting after your treatment, we encourage you to take your nausea medication as directed.   If you develop nausea and vomiting that is not controlled by your nausea medication, call the clinic.   BELOW ARE SYMPTOMS THAT SHOULD BE REPORTED IMMEDIATELY:  *FEVER GREATER THAN 100.5 F  *CHILLS WITH OR WITHOUT FEVER  NAUSEA AND VOMITING THAT IS NOT CONTROLLED WITH YOUR NAUSEA MEDICATION  *UNUSUAL SHORTNESS OF BREATH  *UNUSUAL BRUISING OR BLEEDING  TENDERNESS IN MOUTH AND THROAT WITH OR WITHOUT PRESENCE OF ULCERS  *URINARY PROBLEMS  *BOWEL PROBLEMS  UNUSUAL RASH Items with * indicate a potential emergency and should be followed up as soon as possible.  Feel free to call the clinic should you have any questions or concerns. The clinic phone number is [phone].  Please show the CHEMO ALERT CARD at check-in to the Emergency Department and triage nurse.   

## 2020-06-05 ENCOUNTER — Telehealth: Payer: Self-pay | Admitting: Oncology

## 2020-06-05 NOTE — Telephone Encounter (Signed)
No 10/6 los, no changes made to pt schedule   

## 2020-06-09 DIAGNOSIS — S22080G Wedge compression fracture of T11-T12 vertebra, subsequent encounter for fracture with delayed healing: Secondary | ICD-10-CM | POA: Diagnosis not present

## 2020-06-09 DIAGNOSIS — M4854XA Collapsed vertebra, not elsewhere classified, thoracic region, initial encounter for fracture: Secondary | ICD-10-CM | POA: Diagnosis not present

## 2020-06-09 DIAGNOSIS — S22060G Wedge compression fracture of T7-T8 vertebra, subsequent encounter for fracture with delayed healing: Secondary | ICD-10-CM | POA: Diagnosis not present

## 2020-06-09 DIAGNOSIS — X58XXXA Exposure to other specified factors, initial encounter: Secondary | ICD-10-CM | POA: Diagnosis not present

## 2020-06-10 ENCOUNTER — Inpatient Hospital Stay: Payer: BC Managed Care – PPO

## 2020-06-10 ENCOUNTER — Other Ambulatory Visit: Payer: Self-pay

## 2020-06-10 VITALS — BP 107/71 | HR 93 | Temp 99.6°F | Resp 17 | Wt 121.5 lb

## 2020-06-10 DIAGNOSIS — C9000 Multiple myeloma not having achieved remission: Secondary | ICD-10-CM | POA: Diagnosis not present

## 2020-06-10 DIAGNOSIS — Z23 Encounter for immunization: Secondary | ICD-10-CM | POA: Diagnosis not present

## 2020-06-10 DIAGNOSIS — E876 Hypokalemia: Secondary | ICD-10-CM

## 2020-06-10 DIAGNOSIS — Z7189 Other specified counseling: Secondary | ICD-10-CM

## 2020-06-10 DIAGNOSIS — Z5112 Encounter for antineoplastic immunotherapy: Secondary | ICD-10-CM | POA: Diagnosis not present

## 2020-06-10 LAB — CBC WITH DIFFERENTIAL (CANCER CENTER ONLY)
Abs Immature Granulocytes: 0.02 10*3/uL (ref 0.00–0.07)
Basophils Absolute: 0 10*3/uL (ref 0.0–0.1)
Basophils Relative: 0 %
Eosinophils Absolute: 0.1 10*3/uL (ref 0.0–0.5)
Eosinophils Relative: 3 %
HCT: 28.7 % — ABNORMAL LOW (ref 36.0–46.0)
Hemoglobin: 9.6 g/dL — ABNORMAL LOW (ref 12.0–15.0)
Immature Granulocytes: 1 %
Lymphocytes Relative: 24 %
Lymphs Abs: 1 10*3/uL (ref 0.7–4.0)
MCH: 32.3 pg (ref 26.0–34.0)
MCHC: 33.4 g/dL (ref 30.0–36.0)
MCV: 96.6 fL (ref 80.0–100.0)
Monocytes Absolute: 0.1 10*3/uL (ref 0.1–1.0)
Monocytes Relative: 3 %
Neutro Abs: 2.9 10*3/uL (ref 1.7–7.7)
Neutrophils Relative %: 69 %
Platelet Count: 151 10*3/uL (ref 150–400)
RBC: 2.97 MIL/uL — ABNORMAL LOW (ref 3.87–5.11)
RDW: 20.3 % — ABNORMAL HIGH (ref 11.5–15.5)
WBC Count: 4.1 10*3/uL (ref 4.0–10.5)
nRBC: 0 % (ref 0.0–0.2)

## 2020-06-10 LAB — COMPREHENSIVE METABOLIC PANEL
ALT: 11 U/L (ref 0–44)
AST: 17 U/L (ref 15–41)
Albumin: 3.7 g/dL (ref 3.5–5.0)
Alkaline Phosphatase: 130 U/L — ABNORMAL HIGH (ref 38–126)
Anion gap: 4 — ABNORMAL LOW (ref 5–15)
BUN: 14 mg/dL (ref 6–20)
CO2: 37 mmol/L — ABNORMAL HIGH (ref 22–32)
Calcium: 8.9 mg/dL (ref 8.9–10.3)
Chloride: 98 mmol/L (ref 98–111)
Creatinine, Ser: 0.75 mg/dL (ref 0.44–1.00)
GFR, Estimated: >60 mL/min (ref >60)
Glucose, Bld: 103 mg/dL — ABNORMAL HIGH (ref 70–99)
Potassium: 3.1 mmol/L — ABNORMAL LOW (ref 3.5–5.1)
Sodium: 139 mmol/L (ref 135–145)
Total Bilirubin: 0.9 mg/dL (ref 0.3–1.2)
Total Protein: 6 g/dL — ABNORMAL LOW (ref 6.5–8.1)

## 2020-06-10 LAB — PROTIME-INR
INR: 1.1 (ref 0.8–1.2)
Prothrombin Time: 13.3 seconds (ref 11.4–15.2)

## 2020-06-10 MED ORDER — PROCHLORPERAZINE MALEATE 10 MG PO TABS
ORAL_TABLET | ORAL | Status: AC
  Filled 2020-06-10: qty 1

## 2020-06-10 MED ORDER — BORTEZOMIB CHEMO SQ INJECTION 3.5 MG (2.5MG/ML)
1.3000 mg/m2 | Freq: Once | INTRAMUSCULAR | Status: AC
  Administered 2020-06-10: 2.25 mg via SUBCUTANEOUS
  Filled 2020-06-10: qty 0.9

## 2020-06-10 MED ORDER — POTASSIUM CHLORIDE 10 MEQ/100ML IV SOLN
INTRAVENOUS | Status: AC
  Filled 2020-06-10: qty 100

## 2020-06-10 MED ORDER — PROCHLORPERAZINE MALEATE 10 MG PO TABS
10.0000 mg | ORAL_TABLET | Freq: Once | ORAL | Status: AC
  Administered 2020-06-10: 10 mg via ORAL

## 2020-06-10 MED ORDER — DEXAMETHASONE 4 MG PO TABS
ORAL_TABLET | ORAL | Status: AC
  Filled 2020-06-10: qty 10

## 2020-06-10 MED ORDER — POTASSIUM CHLORIDE 10 MEQ/100ML IV SOLN
10.0000 meq | Freq: Once | INTRAVENOUS | Status: AC
  Administered 2020-06-10: 10 meq via INTRAVENOUS

## 2020-06-10 MED ORDER — DENOSUMAB 120 MG/1.7ML ~~LOC~~ SOLN
SUBCUTANEOUS | Status: AC
  Filled 2020-06-10: qty 1.7

## 2020-06-10 MED ORDER — DEXAMETHASONE 4 MG PO TABS
40.0000 mg | ORAL_TABLET | Freq: Once | ORAL | Status: AC
  Administered 2020-06-10: 40 mg via ORAL

## 2020-06-10 MED ORDER — DENOSUMAB 120 MG/1.7ML ~~LOC~~ SOLN
120.0000 mg | Freq: Once | SUBCUTANEOUS | Status: AC
  Administered 2020-06-10: 120 mg via SUBCUTANEOUS

## 2020-06-10 NOTE — Patient Instructions (Signed)
[facility] Discharge Instructions for Patients Receiving Chemotherapy  Today you received the following chemotherapy agents: bortezomib.  To help prevent nausea and vomiting after your treatment, we encourage you to take your nausea medication as directed.   If you develop nausea and vomiting that is not controlled by your nausea medication, call the clinic.   BELOW ARE SYMPTOMS THAT SHOULD BE REPORTED IMMEDIATELY:  *FEVER GREATER THAN 100.5 F  *CHILLS WITH OR WITHOUT FEVER  NAUSEA AND VOMITING THAT IS NOT CONTROLLED WITH YOUR NAUSEA MEDICATION  *UNUSUAL SHORTNESS OF BREATH  *UNUSUAL BRUISING OR BLEEDING  TENDERNESS IN MOUTH AND THROAT WITH OR WITHOUT PRESENCE OF ULCERS  *URINARY PROBLEMS  *BOWEL PROBLEMS  UNUSUAL RASH Items with * indicate a potential emergency and should be followed up as soon as possible.  Feel free to call the clinic should you have any questions or concerns. The clinic phone number is [phone].  Please show the CHEMO ALERT CARD at check-in to the Emergency Department and triage nurse.   

## 2020-06-10 NOTE — Progress Notes (Signed)
RN reviewed K+ level with MD.  K+ 3.1 -   Pt reports take potassium 10 mEq 2 tablets, twice daily.   Per MD - Recommendations for IV potassium while in clinic today.   Orders placed.  Infusion nurse aware.

## 2020-06-12 ENCOUNTER — Telehealth: Payer: Self-pay

## 2020-06-12 NOTE — Telephone Encounter (Signed)
Pt called and LVM stating she has temp of 100.1 oral and she is concerned. This LPN attempted to return pt's call, but no answer. LVM for pt to call me back.

## 2020-06-16 ENCOUNTER — Telehealth: Payer: Self-pay | Admitting: Oncology

## 2020-06-16 NOTE — Telephone Encounter (Signed)
Scheduled apt per 10/13 staff message - pt aware of appt on 10/20

## 2020-06-17 ENCOUNTER — Encounter: Payer: Self-pay | Admitting: *Deleted

## 2020-06-17 ENCOUNTER — Inpatient Hospital Stay: Payer: BC Managed Care – PPO

## 2020-06-17 ENCOUNTER — Other Ambulatory Visit: Payer: Self-pay

## 2020-06-17 ENCOUNTER — Inpatient Hospital Stay (HOSPITAL_BASED_OUTPATIENT_CLINIC_OR_DEPARTMENT_OTHER): Payer: BC Managed Care – PPO | Admitting: Oncology

## 2020-06-17 VITALS — BP 110/64 | HR 85 | Temp 98.0°F | Resp 17 | Ht 70.0 in | Wt 125.7 lb

## 2020-06-17 DIAGNOSIS — C9000 Multiple myeloma not having achieved remission: Secondary | ICD-10-CM

## 2020-06-17 DIAGNOSIS — Z5112 Encounter for antineoplastic immunotherapy: Secondary | ICD-10-CM | POA: Diagnosis not present

## 2020-06-17 DIAGNOSIS — Z7189 Other specified counseling: Secondary | ICD-10-CM | POA: Diagnosis not present

## 2020-06-17 DIAGNOSIS — Z23 Encounter for immunization: Secondary | ICD-10-CM | POA: Diagnosis not present

## 2020-06-17 LAB — CBC WITH DIFFERENTIAL (CANCER CENTER ONLY)
Abs Immature Granulocytes: 0.02 10*3/uL (ref 0.00–0.07)
Basophils Absolute: 0 10*3/uL (ref 0.0–0.1)
Basophils Relative: 0 %
Eosinophils Absolute: 0.2 10*3/uL (ref 0.0–0.5)
Eosinophils Relative: 5 %
HCT: 27.2 % — ABNORMAL LOW (ref 36.0–46.0)
Hemoglobin: 8.9 g/dL — ABNORMAL LOW (ref 12.0–15.0)
Immature Granulocytes: 1 %
Lymphocytes Relative: 26 %
Lymphs Abs: 1 10*3/uL (ref 0.7–4.0)
MCH: 32.8 pg (ref 26.0–34.0)
MCHC: 32.7 g/dL (ref 30.0–36.0)
MCV: 100.4 fL — ABNORMAL HIGH (ref 80.0–100.0)
Monocytes Absolute: 0.2 10*3/uL (ref 0.1–1.0)
Monocytes Relative: 5 %
Neutro Abs: 2.6 10*3/uL (ref 1.7–7.7)
Neutrophils Relative %: 63 %
Platelet Count: 178 10*3/uL (ref 150–400)
RBC: 2.71 MIL/uL — ABNORMAL LOW (ref 3.87–5.11)
RDW: 21.1 % — ABNORMAL HIGH (ref 11.5–15.5)
WBC Count: 4 10*3/uL (ref 4.0–10.5)
nRBC: 0.5 % — ABNORMAL HIGH (ref 0.0–0.2)

## 2020-06-17 LAB — COMPREHENSIVE METABOLIC PANEL
ALT: 12 U/L (ref 0–44)
AST: 12 U/L — ABNORMAL LOW (ref 15–41)
Albumin: 3.5 g/dL (ref 3.5–5.0)
Alkaline Phosphatase: 113 U/L (ref 38–126)
Anion gap: 6 (ref 5–15)
BUN: 22 mg/dL — ABNORMAL HIGH (ref 6–20)
CO2: 33 mmol/L — ABNORMAL HIGH (ref 22–32)
Calcium: 9.4 mg/dL (ref 8.9–10.3)
Chloride: 102 mmol/L (ref 98–111)
Creatinine, Ser: 0.62 mg/dL (ref 0.44–1.00)
GFR, Estimated: >60 mL/min (ref >60)
Glucose, Bld: 93 mg/dL (ref 70–99)
Potassium: 3.8 mmol/L (ref 3.5–5.1)
Sodium: 141 mmol/L (ref 135–145)
Total Bilirubin: 0.3 mg/dL (ref 0.3–1.2)
Total Protein: 6 g/dL — ABNORMAL LOW (ref 6.5–8.1)

## 2020-06-17 LAB — PROTIME-INR
INR: 1.1 (ref 0.8–1.2)
Prothrombin Time: 13.5 seconds (ref 11.4–15.2)

## 2020-06-17 MED ORDER — WARFARIN SODIUM 5 MG PO TABS: 5.0000 mg | ORAL_TABLET | Freq: Every day | ORAL | 6 refills | Status: DC

## 2020-06-17 MED ORDER — DEXAMETHASONE 4 MG PO TABS
ORAL_TABLET | ORAL | Status: AC
  Filled 2020-06-17: qty 10

## 2020-06-17 MED ORDER — BORTEZOMIB CHEMO SQ INJECTION 3.5 MG (2.5MG/ML)
1.3000 mg/m2 | Freq: Once | INTRAMUSCULAR | Status: AC
  Administered 2020-06-17: 2.25 mg via SUBCUTANEOUS
  Filled 2020-06-17: qty 0.9

## 2020-06-17 MED ORDER — DEXAMETHASONE 4 MG PO TABS
40.0000 mg | ORAL_TABLET | Freq: Once | ORAL | Status: AC
  Administered 2020-06-17: 40 mg via ORAL

## 2020-06-17 MED ORDER — SODIUM CHLORIDE 0.9 % IV SOLN
Freq: Once | INTRAVENOUS | Status: DC
  Filled 2020-06-17: qty 250

## 2020-06-17 NOTE — Progress Notes (Signed)
Per MD request RN successfully completed and faxed [employer] drug and safety form regarding pt rash reaction while on Revlimid.

## 2020-06-17 NOTE — Patient Instructions (Signed)
[facility] Discharge Instructions for Patients Receiving Chemotherapy  Today you received the following chemotherapy agents Velcade.  To help prevent nausea and vomiting after your treatment, we encourage you to take your nausea medication as directed.  If you develop nausea and vomiting that is not controlled by your nausea medication, call the clinic.   BELOW ARE SYMPTOMS THAT SHOULD BE REPORTED IMMEDIATELY:  *FEVER GREATER THAN 100.5 F  *CHILLS WITH OR WITHOUT FEVER  NAUSEA AND VOMITING THAT IS NOT CONTROLLED WITH YOUR NAUSEA MEDICATION  *UNUSUAL SHORTNESS OF BREATH  *UNUSUAL BRUISING OR BLEEDING  TENDERNESS IN MOUTH AND THROAT WITH OR WITHOUT PRESENCE OF ULCERS  *URINARY PROBLEMS  *BOWEL PROBLEMS  UNUSUAL RASH Items with * indicate a potential emergency and should be followed up as soon as possible.  Feel free to call the clinic should you have any questions or concerns. The clinic phone number is [phone].  Please show the CHEMO ALERT CARD at check-in to the Emergency Department and triage nurse.   

## 2020-06-17 NOTE — Progress Notes (Signed)
[facility]  [phone] [phone]    ID: Robin Wilson DOB: [birth year]  MR#: [id number]  MIW#:[id number]  Patient Care Team: Robin Small, MD as PCP - General (Family Medicine) Robin Wilson, Robin Dad, MD as Consulting Physician (Oncology) Robin Maudlin, MD as Consulting Physician (Orthopedic Surgery) Robin Farrier, MD as Consulting Physician (Obstetrics and Gynecology) Robin Pulse, MD (Urology) Robin Lobo, MD as Consulting Physician (Gastroenterology) Robin Daft, MD as Consulting Physician (Interventional Radiology) Robin Levine, MD as Consulting Physician (Neurosurgery) Robin Lot Ramon Dredge, MD as Referring Physician (Hematology and Oncology) Robin Smart, MD as Referring Physician (Diagnostic Radiology) OTHER MD: Robin Wilson. Lambird MD]  CHIEF COMPLAINT: Light chain myeloma  CURRENT TREATMENT: Bortezomib, dexamethasone; thalidomide; denosumab/Xgeva   INTERVAL HISTORY: Robin Wilson returns today for follow up of her myeloma accompanied by her husband Robin Wilson.  She was [facility] at [facility] for possible kyphoplasty at T7.  She was found to have additional compression fractures at T11 and  T12.  She underwent kyphoplasty to all 3 of those areas on 06/09/2020 under Robin Wilson in radiology.  She tolerated the procedure well and she has had a significant reduction in pain, now a 1 or 2 of 10.  She has not needed to take any narcotics since the procedure.  Robin Wilson continues on bortezomib/dexamethasone. Thalidomide was added on 06/04/2020.  So far she has had no rash no constipation and no sleepiness or confusion.  She does take it at bedtime.  She has puffy eyes.  Her skin feels a little bit dry.  Otherwise she is tolerating it well.  We are following the free kappa light chains in the urine and the kappa/lambda ratio in the urine Lab Results  Component Value Date   KPAFRELGTCHN 1,244.5 (H) 04/15/2020   KPAFRELGTCHN 919.4 (H) 03/05/2020    Lab Results  Component Value Date   KAPLAMBRATIO 2,329.41 (H) 05/20/2020   KAPLAMBRATIO 2,500.37 (H) 04/23/2020   KAPLAMBRATIO 366.03 (H) 04/15/2020   KAPLAMBRATIO 0,488.89 (H) 03/24/2020   KAPLAMBRATIO 4,180.24 (H) 03/24/2020   She continues on denosumab/Xgeva every 4 weeks, with her most recent dose on 06/10/2020.   REVIEW OF SYSTEMS: Robin Wilson tells me her appetite is better and she is gained a little bit of weight.  She had a temperature after the kyphoplasty, rising to 100.2 degrees on 06/15/2020 but since then she has had no further fever issues.  She is now walking much better, and is training herself by going up a slope right near her house.  She is having no peripheral neuropathy symptoms.  Detailed review of systems today was otherwise noncontributory   HISTORY OF CURRENT ILLNESS: From the original intake note:  Robin Wilson is a 59 y.o. woman that presented with low and thoracic back pain to Dr. Latanya Wilson. She notes muscle spasms and a low grade fever of 100 degrees.   Labs completed on 02/19/2020 revealed a CBC that was within normal limits except for RBC 2.39, Hgb 8.8, HCT 26.3, MCV 110.0, MCH 36.8, RDW 17.7, AMC 91. CMP was within normal limits except for potassium 3.3, CO2 35. Uric Acid was within normal limits. Urine Protein was abnormal at 57. C-reactive protein was within normal limits. ANA screening was negative.   Thorasic Spine MRI on 02/22/2020 revealed diffuse marrow T1 hypointensity involving vertibral bodies and some spinous processes associated with mild STTR hyperintensity with mild wedge compression fracture of T7 and mild right superior endplate compression fracture of T11 that are suspicious for pathologic fractures.  There is mild central zone narrowing at mid T7 with minimal-deformity of the cord. Differential considerationsinclude Lymphoma, multiple myeloma, and diffuse metastatic disease. There is partially visualized mild-moderate spinal canal stenosis and  mild deformity of the cord at C5-6 and C6-7 probable moderate-severe to severe-neutral foraminal narrowing which would be better assessed dedicated MRI of the cervical spine as clinically warranted.   Lab Results  Component Value Date   TOTALPROTELP 6.1 03/05/2020    Lab Results  Component Value Date   KPAFRELGTCHN 919.4 (H) 03/05/2020   LAMBDASER 7.0 03/05/2020   KAPLAMBRATIO 131.34 (H) 03/05/2020   Of note she had a CT of the abdomen on 01/03/2017 at [facility] which showed a well-defined sclerotic lesion in the left iliac bone consistent with a bone island and no lytic or sclerotic lesions anywhere else.   PAST MEDICAL HISTORY: Past Medical History:  Diagnosis Date  . Hypertension   . Kidney stones   . Kidney stones     PAST SURGICAL HISTORY: Past Surgical History:  Procedure Laterality Date  . CESAREAN SECTION    . CYSTOSCOPY WITH RETROGRADE PYELOGRAM, URETEROSCOPY AND STENT PLACEMENT Right 09/16/2015   Procedure: CYSTOSCOPY WITH RETROGRADE PYELOGRAM, URETEROSCOPY AND STENT PLACEMENT WITH HOLMIUM LASER ABLATION ;  Surgeon: Robin Frock, MD;  Location: [facility];  Service: Urology;  Laterality: Right;  . CYSTOSCOPY/RETROGRADE/URETEROSCOPY  06/04/2012   Procedure: CYSTOSCOPY/RETROGRADE/URETEROSCOPY;  Surgeon: Robin Rud, MD;  Location: [facility];  Service: Urology;  Laterality: Left;  . IR RADIOLOGIST EVAL & MGMT  04/29/2020  . UTERINE FIBROID EMBOLIZATION      FAMILY HISTORY: No family history on file. The patient has 1 brother, 2 sisters.  Both her parents died in their 16s.  There is no cancer history in the family to her knowledge   GYNECOLOGIC HISTORY:  No LMP recorded. Patient has had an ablation. 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  Her 2 sons are from her first marriage, Robin Wilson who is a Administrator, arts at [employer] who graduated from [employer] in Merchandiser, retail.  In 2016 she married her second husband, Robin Wilson.  He is a Loss adjuster, chartered for [employer].  He has 3 children of his own living in [place] and [place].    The patient attends [employer] DIRECTIVES: In the absence of any documents to the contrary the patient's husband is her healthcare power of attorney   HEALTH MAINTENANCE: Social History   Tobacco Use  . Smoking status: Never Smoker  . Smokeless tobacco: Never Used  Substance Use Topics  . Alcohol use: No  . Drug use: No    Colonoscopy: Due  PAP: Up-to-date  Bone density: Osteopenia Mammography: Up-to-date  Allergies  Allergen Reactions  . Sulfa Drugs Cross Reactors     From childhood per pt's mother , pt is unaware of allergy status    Current Outpatient Medications  Medication Sig Dispense Refill  . albuterol (PROVENTIL HFA;VENTOLIN HFA) 108 (90 Base) MCG/ACT inhaler Inhale 1-2 puffs into the lungs every 6 (six) hours as needed for wheezing or shortness of breath.    Marland Kitchen aspirin EC 325 MG tablet Take 1 tablet (325 mg total) by mouth daily. 30 tablet 0  . calcium carbonate (OS-CAL - DOSED IN MG  OF ELEMENTAL CALCIUM) 1250 MG tablet Take 1 tablet by mouth daily.      . cetirizine (ZYRTEC) 10 MG tablet Take 10 mg by mouth daily.     . hydrOXYzine (ATARAX/VISTARIL) 10 MG tablet Take 1 tablet (10 mg total) by mouth every 4 (four) hours as needed for anxiety. 30 tablet 0  . ibuprofen (ADVIL) 200 MG tablet Take 400 mg by mouth every 6 (six) hours as needed for moderate pain.    Marland Kitchen losartan-hydrochlorothiazide (HYZAAR) 100-25 MG per tablet Take 1 tablet by mouth daily.     . Multiple Vitamins-Minerals (MULTIVITAMIN PO) Take 1 tablet by mouth daily.     . ondansetron (ZOFRAN) 4 MG tablet Take 1 tablet (4 mg total) by mouth every 6 (six) hours. 30 tablet 0  . oxyCODONE (OXY  IR/ROXICODONE) 5 MG immediate release tablet Take 1-2 tablets (5-10 mg total) by mouth every 6 (six) hours as needed for severe pain. 120 tablet 0  . potassium chloride (KLOR-CON) 10 MEQ tablet Take 2 tablets (20 mEq total) by mouth 2 (two) times daily. 120 tablet 0  . promethazine (PHENERGAN) 25 MG tablet Take 1 tablet (25 mg total) by mouth every 6 (six) hours as needed for nausea. 12 tablet 0  . senna-docusate (SENOKOT-S) 8.6-50 MG tablet Take 1 tablet by mouth 2 (two) times daily. While taking pain meds to prevent constipation 30 tablet 0  . sertraline (ZOLOFT) 50 MG tablet Take 75 mg by mouth daily.    . thalidomide (THALOMID) 100 MG capsule Take 1 capsule (100 mg total) by mouth at bedtime. Take 14 days on, 7 days off, repeat every 21 days. [employer] Auth # [id number]; Date Obtained 05/27/2020 28 capsule 0  . traZODone (DESYREL) 50 MG tablet Take 50 mg by mouth at bedtime.    . valACYclovir (VALTREX) 1000 MG tablet Take 1 tablet (1,000 mg total) by mouth 2 (two) times daily. (Patient taking differently: Take 1,000 mg by mouth daily. ) 30 tablet 6  . warfarin (COUMADIN) 5 MG tablet Take 1 tablet (5 mg total) by mouth daily. 30 tablet 3   No current facility-administered medications for this visit.    OBJECTIVE: White woman who appears younger than stated age  15:   06/17/20 1308  BP: 110/64  Wilson: 85  Resp: 17  Temp: 98 F (36.7 C)  SpO2: 99%   Wt Readings from Last 3 Encounters:  06/17/20 125 lb 11.2 oz (57 kg)  06/10/20 121 lb 8 oz (55.1 kg)  06/03/20 122 lb 11.2 oz (55.7 kg)   Body mass index is 18.04 kg/m.    ECOG FS:1 - Symptomatic but completely ambulatory  Sclerae unicteric, EOMs intact Wearing a mask No cervical or supraclavicular adenopathy Lungs no rales or rhonchi Heart regular rate and rhythm Abd soft, nontender, positive bowel sounds MSK no focal spinal tenderness, no upper extremity lymphedema Neuro: nonfocal, well oriented, appropriate affect Breasts:  Deferred Skin: In the left upper extremity laterally there is a spot which is tender on deep palpation, with no mass noted on the soft tissue.  There may be a myeloma lesion underlying that.   LAB RESULTS:  CMP     Component Value Date/Time   NA 141 06/17/2020 1248   K 3.8 06/17/2020 1248   CL 102 06/17/2020 1248   CO2 33 (H) 06/17/2020 1248   GLUCOSE 93 06/17/2020 1248   BUN 22 (H) 06/17/2020 1248   CREATININE 0.62 06/17/2020 1248   CALCIUM 9.4 06/17/2020 1248  PROT 6.0 (L) 06/17/2020 1248   ALBUMIN 3.5 06/17/2020 1248   AST 12 (L) 06/17/2020 1248   ALT 12 06/17/2020 1248   ALKPHOS 113 06/17/2020 1248   BILITOT 0.3 06/17/2020 1248   GFRNONAA >60 06/17/2020 1248   GFRAA >60 05/27/2020 1428    Lab Results  Component Value Date   TOTALPROTELP 5.7 (L) 04/15/2020     Lab Results  Component Value Date   KPAFRELGTCHN 1,244.5 (H) 04/15/2020   LAMBDASER 3.4 (L) 04/15/2020   KAPLAMBRATIO 2,329.41 (H) 05/20/2020    Lab Results  Component Value Date   WBC 4.0 06/17/2020   NEUTROABS 2.6 06/17/2020   HGB 8.9 (L) 06/17/2020   HCT 27.2 (L) 06/17/2020   MCV 100.4 (H) 06/17/2020   PLT 178 06/17/2020    No results found for: LABCA2  No components found for: XNTZGY174  Recent Labs  Lab 06/10/20 1421  INR 1.1    No results found for: LABCA2  No results found for: BSW967  No results found for: RFF638  No results found for: GYK599  No results found for: CA2729  No components found for: HGQUANT  No results found for: CEA1 / No results found for: CEA1   No results found for: AFPTUMOR  No results found for: CHROMOGRNA  No results found for: HGBA, HGBA2QUANT, HGBFQUANT, HGBSQUAN (Hemoglobinopathy evaluation)   Lab Results  Component Value Date   LDH 240 (H) 04/08/2020    No results found for: IRON, TIBC, IRONPCTSAT (Iron and TIBC)  No results found for: FERRITIN  Urinalysis    Component Value Date/Time   COLORURINE YELLOW 09/15/2015 2315    APPEARANCEUR CLOUDY (A) 09/15/2015 2315   LABSPEC 1.014 09/15/2015 2315   PHURINE 8.5 (H) 09/15/2015 2315   GLUCOSEU NEGATIVE 09/15/2015 2315   HGBUR Wilson (A) 09/15/2015 2315   BILIRUBINUR NEGATIVE 09/15/2015 2315   KETONESUR 15 (A) 09/15/2015 2315   PROTEINUR NEGATIVE 09/15/2015 2315   UROBILINOGEN 1.0 05/24/2013 1913   NITRITE NEGATIVE 09/15/2015 2315   LEUKOCYTESUR TRACE (A) 09/15/2015 2315    STUDIES:  No results found.   ELIGIBLE FOR AVAILABLE RESEARCH PROTOCOL: no   ASSESSMENT: 59 y.o. [place] woman presenting June 2021 with severe back pain, thoracic MRI 02/22/2020 suggestive of possible myeloma.  Work-up included:  (a) 03/05/2020 Myeloma panel shows no M protein but all immunoglobulins are decreased.  The lambda ratio is markedly abnormal at 131.34; review of the blood film is not diagnostic  (b) 24-hour urine: markedly elevated Kappa light chains at 9,656; urine kappa/lambda ratio 4180.24, and urine M spike of 239.  (c) bone survey on 03/16/2020 shows rounded lucencies throughout the skull which could be venous lakes  (d) bone marrow biopsy on 7/27 confirms multiple myeloma with 44% plasma cells on the aspirate by manual differential, and 70 to 80% on the biopsy by CD138 immunohistochemistry  (e) cytogenetics  03/24/2020: no metaphase cells available for analysis  (f) myeloma FISH studies 03/24/2020 show t(11,14), with NO del(17p), t(1/14) or t(14.16)  (g) on 04/08/2020 LDH 240, beta-2-microglobulin 4.4, albumin 4.2  (h) R-ISS Stage: II   (1) bortezomib/dexamethasone started 03/31/2020, discontinued after 05/20/2020 dose  (a) Lenalidomide added on 04/21/2020, stopped on 8/26 after two doses secondary to rash, resumed 04/29/2020 again with a very poor tolerance, treated with Medrol Dosepak, third attempt 05/14/2020, again unable to tolerate   (b) originally on ASA and acyclovir for supportive care, changed September 2021  (2) denosumab/Xgeva started  05/06/2020  (3) thalidomide added 06/04/2020  to bortezomib/ dexamethasone 05/27/2020  (a) to start warfarin after planned kyphoplasty, continuing Valtrex 1000 mg daily  (4) s/p kyphoplasty to T7, T11 and T12 on 06/09/2020 at [place]: Robin Wilson is doing much better as far as her pain is concerned after the kyphoplasty which she generally tolerated well.  Right now she is on no pain medications.  She stopped the aspirin before the kyphoplasty.  She was supposed to have gone on warfarin after but never did start it.  We discussed that today and I have placed the order again in case for some reason I did not go through her pharmacy.  We will be following the PT/INR on a weekly basis.  She tells me that she has already contacted [facility] regarding the autologous transplant she hopes to undergo sometime in December perhaps and that "insurance is progressing as it should".  She will finish her first cycle of thalidomide tomorrow.  She will have a 24-hour urine a week from today and we will repeat that every 3 weeks.  She is a little bit more anemic.  She is not very symptomatic from this.  We will continue to follow.  She knows to call for any other issue that may develop before the next visit  Total encounter time 35 minutes.Sarajane Jews C. Jentry Mcqueary, MD 06/17/20 1:43 PM Medical Oncology and Hematology [facility] [address] [place], [place] [address] Tel. [phone]    Fax. [phone]   I, Wilburn Mylar, am acting as scribe for Dr. Virgie Wilson. Laasya Peyton.  I, Lurline Del MD, have reviewed the above documentation for accuracy and completeness, and I agree with the above.   *Total Encounter Time as defined by the Centers for Medicare and Medicaid Services includes, in addition to the face-to-face time of a patient visit (documented in the note above) non-face-to-face time: obtaining and reviewing outside history, ordering and reviewing medications, tests or  procedures, care coordination (communications with other health care professionals or caregivers) and documentation in the medical record.

## 2020-06-18 LAB — KAPPA/LAMBDA LIGHT CHAINS
Kappa free light chain: 398.9 mg/L — ABNORMAL HIGH (ref 3.3–19.4)
Kappa, lambda light chain ratio: 88.64 — ABNORMAL HIGH (ref 0.26–1.65)
Lambda free light chains: 4.5 mg/L — ABNORMAL LOW (ref 5.7–26.3)

## 2020-06-19 LAB — PROTEIN ELECTROPHORESIS, SERUM
A/G Ratio: 1.8 — ABNORMAL HIGH (ref 0.7–1.7)
Albumin ELP: 3.5 g/dL (ref 2.9–4.4)
Alpha-1-Globulin: 0.3 g/dL (ref 0.0–0.4)
Alpha-2-Globulin: 0.7 g/dL (ref 0.4–1.0)
Beta Globulin: 0.7 g/dL (ref 0.7–1.3)
Gamma Globulin: 0.3 g/dL — ABNORMAL LOW (ref 0.4–1.8)
Globulin, Total: 1.9 g/dL — ABNORMAL LOW (ref 2.2–3.9)
Total Protein ELP: 5.4 g/dL — ABNORMAL LOW (ref 6.0–8.5)

## 2020-06-22 DIAGNOSIS — S22060G Wedge compression fracture of T7-T8 vertebra, subsequent encounter for fracture with delayed healing: Secondary | ICD-10-CM | POA: Diagnosis not present

## 2020-06-24 ENCOUNTER — Other Ambulatory Visit: Payer: Self-pay

## 2020-06-24 ENCOUNTER — Inpatient Hospital Stay: Payer: BC Managed Care – PPO

## 2020-06-24 VITALS — BP 114/53 | HR 80 | Temp 98.6°F | Ht 70.0 in | Wt 126.5 lb

## 2020-06-24 DIAGNOSIS — C9000 Multiple myeloma not having achieved remission: Secondary | ICD-10-CM

## 2020-06-24 DIAGNOSIS — Z7189 Other specified counseling: Secondary | ICD-10-CM

## 2020-06-24 DIAGNOSIS — Z23 Encounter for immunization: Secondary | ICD-10-CM | POA: Diagnosis not present

## 2020-06-24 DIAGNOSIS — Z5112 Encounter for antineoplastic immunotherapy: Secondary | ICD-10-CM | POA: Diagnosis not present

## 2020-06-24 LAB — COMPREHENSIVE METABOLIC PANEL
ALT: 29 U/L (ref 0–44)
AST: 19 U/L (ref 15–41)
Albumin: 3.8 g/dL (ref 3.5–5.0)
Alkaline Phosphatase: 99 U/L (ref 38–126)
Anion gap: 5 (ref 5–15)
BUN: 13 mg/dL (ref 6–20)
CO2: 31 mmol/L (ref 22–32)
Calcium: 9.2 mg/dL (ref 8.9–10.3)
Chloride: 103 mmol/L (ref 98–111)
Creatinine, Ser: 0.63 mg/dL (ref 0.44–1.00)
GFR, Estimated: >60 mL/min (ref >60)
Glucose, Bld: 90 mg/dL (ref 70–99)
Potassium: 4.3 mmol/L (ref 3.5–5.1)
Sodium: 139 mmol/L (ref 135–145)
Total Bilirubin: 0.5 mg/dL (ref 0.3–1.2)
Total Protein: 6.1 g/dL — ABNORMAL LOW (ref 6.5–8.1)

## 2020-06-24 LAB — CBC WITH DIFFERENTIAL (CANCER CENTER ONLY)
Abs Immature Granulocytes: 0.02 10*3/uL (ref 0.00–0.07)
Basophils Absolute: 0 10*3/uL (ref 0.0–0.1)
Basophils Relative: 1 %
Eosinophils Absolute: 0.1 10*3/uL (ref 0.0–0.5)
Eosinophils Relative: 3 %
HCT: 28.7 % — ABNORMAL LOW (ref 36.0–46.0)
Hemoglobin: 9.2 g/dL — ABNORMAL LOW (ref 12.0–15.0)
Immature Granulocytes: 1 %
Lymphocytes Relative: 34 %
Lymphs Abs: 1.3 10*3/uL (ref 0.7–4.0)
MCH: 32.2 pg (ref 26.0–34.0)
MCHC: 32.1 g/dL (ref 30.0–36.0)
MCV: 100.3 fL — ABNORMAL HIGH (ref 80.0–100.0)
Monocytes Absolute: 0.3 10*3/uL (ref 0.1–1.0)
Monocytes Relative: 8 %
Neutro Abs: 2.1 10*3/uL (ref 1.7–7.7)
Neutrophils Relative %: 53 %
Platelet Count: 199 10*3/uL (ref 150–400)
RBC: 2.86 MIL/uL — ABNORMAL LOW (ref 3.87–5.11)
RDW: 22.2 % — ABNORMAL HIGH (ref 11.5–15.5)
WBC Count: 3.9 10*3/uL — ABNORMAL LOW (ref 4.0–10.5)
nRBC: 1 % — ABNORMAL HIGH (ref 0.0–0.2)

## 2020-06-24 LAB — PROTIME-INR
INR: 1 (ref 0.8–1.2)
Prothrombin Time: 12.3 s (ref 11.4–15.2)

## 2020-06-24 MED ORDER — INFLUENZA VAC SPLIT QUAD 0.5 ML IM SUSY
0.5000 mL | PREFILLED_SYRINGE | Freq: Once | INTRAMUSCULAR | Status: AC
  Administered 2020-06-24: 0.5 mL via INTRAMUSCULAR

## 2020-06-24 MED ORDER — DEXAMETHASONE 4 MG PO TABS
ORAL_TABLET | ORAL | Status: AC
  Filled 2020-06-24: qty 10

## 2020-06-24 MED ORDER — BORTEZOMIB CHEMO SQ INJECTION 3.5 MG (2.5MG/ML)
1.3000 mg/m2 | Freq: Once | INTRAMUSCULAR | Status: AC
  Administered 2020-06-24: 2.25 mg via SUBCUTANEOUS
  Filled 2020-06-24: qty 0.9

## 2020-06-24 MED ORDER — INFLUENZA VAC SPLIT QUAD 0.5 ML IM SUSY
PREFILLED_SYRINGE | INTRAMUSCULAR | Status: AC
  Filled 2020-06-24: qty 0.5

## 2020-06-24 MED ORDER — DEXAMETHASONE 4 MG PO TABS
40.0000 mg | ORAL_TABLET | Freq: Once | ORAL | Status: AC
  Administered 2020-06-24: 40 mg via ORAL

## 2020-06-24 NOTE — Patient Instructions (Signed)
[facility] Discharge Instructions for Patients Receiving Chemotherapy  Today you received the following chemotherapy agents Velcade.  To help prevent nausea and vomiting after your treatment, we encourage you to take your nausea medication as directed.  If you develop nausea and vomiting that is not controlled by your nausea medication, call the clinic.   BELOW ARE SYMPTOMS THAT SHOULD BE REPORTED IMMEDIATELY:  *FEVER GREATER THAN 100.5 F  *CHILLS WITH OR WITHOUT FEVER  NAUSEA AND VOMITING THAT IS NOT CONTROLLED WITH YOUR NAUSEA MEDICATION  *UNUSUAL SHORTNESS OF BREATH  *UNUSUAL BRUISING OR BLEEDING  TENDERNESS IN MOUTH AND THROAT WITH OR WITHOUT PRESENCE OF ULCERS  *URINARY PROBLEMS  *BOWEL PROBLEMS  UNUSUAL RASH Items with * indicate a potential emergency and should be followed up as soon as possible.  Feel free to call the clinic should you have any questions or concerns. The clinic phone number is [phone].  Please show the CHEMO ALERT CARD at check-in to the Emergency Department and triage nurse.   

## 2020-06-25 ENCOUNTER — Other Ambulatory Visit: Payer: Self-pay | Admitting: Oncology

## 2020-06-25 DIAGNOSIS — C9000 Multiple myeloma not having achieved remission: Secondary | ICD-10-CM

## 2020-06-25 NOTE — Progress Notes (Signed)
Robin Wilson's INR yesterday is not therapeutic.  She actually only started the Coumadin 2 days prior because she had difficulty getting into the pharmacy.  Accordingly I am not making any dose adjustments she will have labs repeated she returns next week

## 2020-06-26 DIAGNOSIS — Z23 Encounter for immunization: Secondary | ICD-10-CM | POA: Diagnosis not present

## 2020-06-26 DIAGNOSIS — Z5112 Encounter for antineoplastic immunotherapy: Secondary | ICD-10-CM | POA: Diagnosis not present

## 2020-06-26 DIAGNOSIS — C9000 Multiple myeloma not having achieved remission: Secondary | ICD-10-CM | POA: Diagnosis not present

## 2020-06-27 ENCOUNTER — Encounter: Payer: Self-pay | Admitting: Oncology

## 2020-06-27 LAB — KAPPA/LAMBDA LIGHT CHAINS, FREE, WITH RATIO, 24HR. URINE
FR KAPPA LT CH,24HR: 1462.47 mg/24 hr
FR LAMBDA LT CH,24HR: 1.59 mg/24 hr
Free Kappa Lt Chains,Ur: 1720.55 mg/L — ABNORMAL HIGH (ref 0.63–113.79)
Free Kappa/Lambda Ratio: 920.08 — ABNORMAL HIGH (ref 1.03–31.76)
Free Lambda Lt Chains,Ur: 1.87 mg/L (ref 0.47–11.77)
Total Volume: 850

## 2020-07-01 ENCOUNTER — Other Ambulatory Visit: Payer: Self-pay

## 2020-07-01 ENCOUNTER — Other Ambulatory Visit: Payer: Self-pay | Admitting: Oncology

## 2020-07-01 ENCOUNTER — Inpatient Hospital Stay: Payer: BC Managed Care – PPO | Attending: Oncology

## 2020-07-01 ENCOUNTER — Telehealth: Payer: Self-pay | Admitting: *Deleted

## 2020-07-01 ENCOUNTER — Inpatient Hospital Stay: Payer: BC Managed Care – PPO

## 2020-07-01 VITALS — BP 120/60 | HR 83 | Temp 98.7°F | Resp 18 | Ht 70.0 in | Wt 127.4 lb

## 2020-07-01 DIAGNOSIS — Z5112 Encounter for antineoplastic immunotherapy: Secondary | ICD-10-CM | POA: Insufficient documentation

## 2020-07-01 DIAGNOSIS — C9000 Multiple myeloma not having achieved remission: Secondary | ICD-10-CM | POA: Insufficient documentation

## 2020-07-01 DIAGNOSIS — Z7189 Other specified counseling: Secondary | ICD-10-CM

## 2020-07-01 LAB — CBC WITH DIFFERENTIAL (CANCER CENTER ONLY)
Abs Immature Granulocytes: 0.02 10*3/uL (ref 0.00–0.07)
Basophils Absolute: 0 10*3/uL (ref 0.0–0.1)
Basophils Relative: 1 %
Eosinophils Absolute: 0.1 10*3/uL (ref 0.0–0.5)
Eosinophils Relative: 4 %
HCT: 29 % — ABNORMAL LOW (ref 36.0–46.0)
Hemoglobin: 9.3 g/dL — ABNORMAL LOW (ref 12.0–15.0)
Immature Granulocytes: 1 %
Lymphocytes Relative: 42 %
Lymphs Abs: 1.6 10*3/uL (ref 0.7–4.0)
MCH: 33.1 pg (ref 26.0–34.0)
MCHC: 32.1 g/dL (ref 30.0–36.0)
MCV: 103.2 fL — ABNORMAL HIGH (ref 80.0–100.0)
Monocytes Absolute: 0.3 10*3/uL (ref 0.1–1.0)
Monocytes Relative: 7 %
Neutro Abs: 1.7 10*3/uL (ref 1.7–7.7)
Neutrophils Relative %: 45 %
Platelet Count: 238 10*3/uL (ref 150–400)
RBC: 2.81 MIL/uL — ABNORMAL LOW (ref 3.87–5.11)
RDW: 22.2 % — ABNORMAL HIGH (ref 11.5–15.5)
WBC Count: 3.7 10*3/uL — ABNORMAL LOW (ref 4.0–10.5)
nRBC: 0.5 % — ABNORMAL HIGH (ref 0.0–0.2)

## 2020-07-01 LAB — COMPREHENSIVE METABOLIC PANEL
ALT: 57 U/L — ABNORMAL HIGH (ref 0–44)
AST: 21 U/L (ref 15–41)
Albumin: 3.8 g/dL (ref 3.5–5.0)
Alkaline Phosphatase: 98 U/L (ref 38–126)
Anion gap: 8 (ref 5–15)
BUN: 18 mg/dL (ref 6–20)
CO2: 30 mmol/L (ref 22–32)
Calcium: 8.8 mg/dL — ABNORMAL LOW (ref 8.9–10.3)
Chloride: 102 mmol/L (ref 98–111)
Creatinine, Ser: 0.69 mg/dL (ref 0.44–1.00)
GFR, Estimated: >60 mL/min (ref >60)
Glucose, Bld: 84 mg/dL (ref 70–99)
Potassium: 3.3 mmol/L — ABNORMAL LOW (ref 3.5–5.1)
Sodium: 140 mmol/L (ref 135–145)
Total Bilirubin: 0.5 mg/dL (ref 0.3–1.2)
Total Protein: 6.1 g/dL — ABNORMAL LOW (ref 6.5–8.1)

## 2020-07-01 LAB — PROTIME-INR
INR: 1.5 — ABNORMAL HIGH (ref 0.8–1.2)
Prothrombin Time: 17.6 seconds — ABNORMAL HIGH (ref 11.4–15.2)

## 2020-07-01 MED ORDER — BORTEZOMIB CHEMO SQ INJECTION 3.5 MG (2.5MG/ML)
1.3000 mg/m2 | Freq: Once | INTRAMUSCULAR | Status: AC
  Administered 2020-07-01: 2.25 mg via SUBCUTANEOUS
  Filled 2020-07-01: qty 0.9

## 2020-07-01 MED ORDER — DEXAMETHASONE 4 MG PO TABS
ORAL_TABLET | ORAL | Status: AC
  Filled 2020-07-01: qty 10

## 2020-07-01 MED ORDER — PREDNISONE 5 MG PO TABS: ORAL_TABLET | ORAL | 0 refills | Status: DC

## 2020-07-01 MED ORDER — DEXAMETHASONE 4 MG PO TABS
40.0000 mg | ORAL_TABLET | Freq: Once | ORAL | Status: AC
  Administered 2020-07-01: 40 mg via ORAL

## 2020-07-01 NOTE — Progress Notes (Unsigned)
Robin Wilson has developed a rash with thalidomide.    This is not unexpected since she had a pretty horrible rash previously with lenalidomide which is related.  We are going to try to get back to daratumumab which is what we wanted to do in the first place.  I will be entering the orders today and hopefully she can get started a week from today.

## 2020-07-01 NOTE — Telephone Encounter (Signed)
Received vm call from [facility] asking for return call regarding ref # [id number] & missing [employer] Auth.  Returned call after talking with Dr Jana Hakim & cancelled script b/c pt was seen today with a rash.

## 2020-07-01 NOTE — Patient Instructions (Signed)
[facility] Discharge Instructions for Patients Receiving Chemotherapy  Today you received the following chemotherapy agents Velcade.  To help prevent nausea and vomiting after your treatment, we encourage you to take your nausea medication as directed.  If you develop nausea and vomiting that is not controlled by your nausea medication, call the clinic.   BELOW ARE SYMPTOMS THAT SHOULD BE REPORTED IMMEDIATELY:  *FEVER GREATER THAN 100.5 F  *CHILLS WITH OR WITHOUT FEVER  NAUSEA AND VOMITING THAT IS NOT CONTROLLED WITH YOUR NAUSEA MEDICATION  *UNUSUAL SHORTNESS OF BREATH  *UNUSUAL BRUISING OR BLEEDING  TENDERNESS IN MOUTH AND THROAT WITH OR WITHOUT PRESENCE OF ULCERS  *URINARY PROBLEMS  *BOWEL PROBLEMS  UNUSUAL RASH Items with * indicate a potential emergency and should be followed up as soon as possible.  Feel free to call the clinic should you have any questions or concerns. The clinic phone number is [phone].  Please show the CHEMO ALERT CARD at check-in to the Emergency Department and triage nurse.   

## 2020-07-04 ENCOUNTER — Encounter: Payer: Self-pay | Admitting: Oncology

## 2020-07-06 ENCOUNTER — Other Ambulatory Visit: Payer: Self-pay | Admitting: Oncology

## 2020-07-08 ENCOUNTER — Inpatient Hospital Stay: Payer: BC Managed Care – PPO

## 2020-07-08 ENCOUNTER — Other Ambulatory Visit: Payer: Self-pay | Admitting: Oncology

## 2020-07-08 ENCOUNTER — Other Ambulatory Visit: Payer: Self-pay

## 2020-07-08 VITALS — BP 113/68 | HR 96 | Temp 98.5°F | Resp 18

## 2020-07-08 DIAGNOSIS — C9000 Multiple myeloma not having achieved remission: Secondary | ICD-10-CM | POA: Diagnosis not present

## 2020-07-08 DIAGNOSIS — Z5112 Encounter for antineoplastic immunotherapy: Secondary | ICD-10-CM | POA: Diagnosis not present

## 2020-07-08 DIAGNOSIS — Z7189 Other specified counseling: Secondary | ICD-10-CM

## 2020-07-08 LAB — CBC WITH DIFFERENTIAL (CANCER CENTER ONLY)
Abs Immature Granulocytes: 0.03 10*3/uL (ref 0.00–0.07)
Basophils Absolute: 0 10*3/uL (ref 0.0–0.1)
Basophils Relative: 1 %
Eosinophils Absolute: 0 10*3/uL (ref 0.0–0.5)
Eosinophils Relative: 0 %
HCT: 31.1 % — ABNORMAL LOW (ref 36.0–46.0)
Hemoglobin: 10.2 g/dL — ABNORMAL LOW (ref 12.0–15.0)
Immature Granulocytes: 1 %
Lymphocytes Relative: 34 %
Lymphs Abs: 1.7 10*3/uL (ref 0.7–4.0)
MCH: 34.1 pg — ABNORMAL HIGH (ref 26.0–34.0)
MCHC: 32.8 g/dL (ref 30.0–36.0)
MCV: 104 fL — ABNORMAL HIGH (ref 80.0–100.0)
Monocytes Absolute: 0.1 10*3/uL (ref 0.1–1.0)
Monocytes Relative: 3 %
Neutro Abs: 3 10*3/uL (ref 1.7–7.7)
Neutrophils Relative %: 61 %
Platelet Count: 329 10*3/uL (ref 150–400)
RBC: 2.99 MIL/uL — ABNORMAL LOW (ref 3.87–5.11)
RDW: 21.9 % — ABNORMAL HIGH (ref 11.5–15.5)
WBC Count: 4.9 10*3/uL (ref 4.0–10.5)
nRBC: 0.6 % — ABNORMAL HIGH (ref 0.0–0.2)

## 2020-07-08 LAB — COMPREHENSIVE METABOLIC PANEL
ALT: 25 U/L (ref 0–44)
AST: 14 U/L — ABNORMAL LOW (ref 15–41)
Albumin: 3.9 g/dL (ref 3.5–5.0)
Alkaline Phosphatase: 96 U/L (ref 38–126)
Anion gap: 10 (ref 5–15)
BUN: 19 mg/dL (ref 6–20)
CO2: 30 mmol/L (ref 22–32)
Calcium: 9.2 mg/dL (ref 8.9–10.3)
Chloride: 100 mmol/L (ref 98–111)
Creatinine, Ser: 0.73 mg/dL (ref 0.44–1.00)
GFR, Estimated: >60 mL/min (ref >60)
Glucose, Bld: 142 mg/dL — ABNORMAL HIGH (ref 70–99)
Potassium: 3.8 mmol/L (ref 3.5–5.1)
Sodium: 140 mmol/L (ref 135–145)
Total Bilirubin: 0.6 mg/dL (ref 0.3–1.2)
Total Protein: 6.2 g/dL — ABNORMAL LOW (ref 6.5–8.1)

## 2020-07-08 LAB — PROTIME-INR
INR: 1.3 — ABNORMAL HIGH (ref 0.8–1.2)
Prothrombin Time: 15.9 seconds — ABNORMAL HIGH (ref 11.4–15.2)

## 2020-07-08 MED ORDER — ACETAMINOPHEN 325 MG PO TABS
650.0000 mg | ORAL_TABLET | Freq: Once | ORAL | Status: AC
  Administered 2020-07-08: 650 mg via ORAL

## 2020-07-08 MED ORDER — DEXAMETHASONE 4 MG PO TABS: 20.0000 mg | ORAL_TABLET | ORAL | 1 refills | Status: DC

## 2020-07-08 MED ORDER — ACETAMINOPHEN 325 MG PO TABS
ORAL_TABLET | ORAL | Status: AC
  Filled 2020-07-08: qty 2

## 2020-07-08 MED ORDER — DEXAMETHASONE 4 MG PO TABS
20.0000 mg | ORAL_TABLET | Freq: Once | ORAL | Status: AC
  Administered 2020-07-08: 20 mg via ORAL

## 2020-07-08 MED ORDER — MONTELUKAST SODIUM 10 MG PO TABS
10.0000 mg | ORAL_TABLET | Freq: Once | ORAL | Status: AC
  Administered 2020-07-08: 10 mg via ORAL

## 2020-07-08 MED ORDER — BORTEZOMIB CHEMO SQ INJECTION 3.5 MG (2.5MG/ML)
1.3000 mg/m2 | Freq: Once | INTRAMUSCULAR | Status: AC
  Administered 2020-07-08: 2.25 mg via SUBCUTANEOUS
  Filled 2020-07-08: qty 0.9

## 2020-07-08 MED ORDER — DENOSUMAB 120 MG/1.7ML ~~LOC~~ SOLN
120.0000 mg | Freq: Once | SUBCUTANEOUS | Status: AC
  Administered 2020-07-08: 120 mg via SUBCUTANEOUS

## 2020-07-08 MED ORDER — DENOSUMAB 120 MG/1.7ML ~~LOC~~ SOLN
SUBCUTANEOUS | Status: AC
  Filled 2020-07-08: qty 1.7

## 2020-07-08 MED ORDER — DEXAMETHASONE 4 MG PO TABS
ORAL_TABLET | ORAL | Status: AC
  Filled 2020-07-08: qty 5

## 2020-07-08 MED ORDER — MONTELUKAST SODIUM 10 MG PO TABS
ORAL_TABLET | ORAL | Status: AC
  Filled 2020-07-08: qty 1

## 2020-07-08 MED ORDER — DIPHENHYDRAMINE HCL 25 MG PO CAPS
ORAL_CAPSULE | ORAL | Status: AC
  Filled 2020-07-08: qty 2

## 2020-07-08 MED ORDER — DARATUMUMAB-HYALURONIDASE-FIHJ 1800-30000 MG-UT/15ML ~~LOC~~ SOLN
1800.0000 mg | Freq: Once | SUBCUTANEOUS | Status: AC
  Administered 2020-07-08: 1800 mg via SUBCUTANEOUS
  Filled 2020-07-08: qty 15

## 2020-07-08 MED ORDER — DIPHENHYDRAMINE HCL 25 MG PO CAPS
50.0000 mg | ORAL_CAPSULE | Freq: Once | ORAL | Status: AC
  Administered 2020-07-08: 50 mg via ORAL

## 2020-07-08 NOTE — Progress Notes (Signed)
I met with Robin Wilson, discussed the possible toxicities side effects and complications of daratumumab and gave her written information on possible side effects and reasons to call us.  I am going to see her again next week just to make sure she tolerated the first dose well.

## 2020-07-08 NOTE — Patient Instructions (Addendum)
[facility] Discharge Instructions for Patients Receiving Chemotherapy  Today you received the following chemotherapy agents: Velcade & Darzalex Faspro injection  To help prevent nausea and vomiting after your treatment, we encourage you to take your nausea medication as directed.  If you develop nausea and vomiting that is not controlled by your nausea medication, call the clinic.   BELOW ARE SYMPTOMS THAT SHOULD BE REPORTED IMMEDIATELY:  *FEVER GREATER THAN 100.5 F  *CHILLS WITH OR WITHOUT FEVER  NAUSEA AND VOMITING THAT IS NOT CONTROLLED WITH YOUR NAUSEA MEDICATION  *UNUSUAL SHORTNESS OF BREATH  *UNUSUAL BRUISING OR BLEEDING  TENDERNESS IN MOUTH AND THROAT WITH OR WITHOUT PRESENCE OF ULCERS  *URINARY PROBLEMS  *BOWEL PROBLEMS  UNUSUAL RASH Items with * indicate a potential emergency and should be followed up as soon as possible.  Feel free to call the clinic should you have any questions or concerns. The clinic phone number is [phone].  Please show the [facility] at check-in to the Emergency Department and triage nurse.    Daratumumab injection What is this medicine? DARATUMUMAB (dar a toom ue mab) is a monoclonal antibody. It is used to treat multiple myeloma. This medicine may be used for other purposes; ask your health care provider or pharmacist if you have questions. COMMON BRAND NAME(S): DARZALEX What should I tell my health care provider before I take this medicine? They need to know if you have any of these conditions:  infection (especially a virus infection such as chickenpox, herpes, or hepatitis B virus)  lung or breathing disease  an unusual or allergic reaction to daratumumab, other medicines, foods, dyes, or preservatives  pregnant or trying to get pregnant  breast-feeding How should I use this medicine? This medicine is for infusion into a vein. It is given by a health care professional in a hospital or clinic  setting. Talk to your pediatrician regarding the use of this medicine in children. Special care may be needed. Overdosage: If you think you have taken too much of this medicine contact a poison control center or emergency room at once. NOTE: This medicine is only for you. Do not share this medicine with others. What if I miss a dose? Keep appointments for follow-up doses as directed. It is important not to miss your dose. Call your doctor or health care professional if you are unable to keep an appointment. What may interact with this medicine? Interactions have not been studied. This list may not describe all possible interactions. Give your health care provider a list of all the medicines, herbs, non-prescription drugs, or dietary supplements you use. Also tell them if you smoke, drink alcohol, or use illegal drugs. Some items may interact with your medicine. What should I watch for while using this medicine? This drug may make you feel generally unwell. Report any side effects. Continue your course of treatment even though you feel ill unless your doctor tells you to stop. This medicine can cause serious allergic reactions. To reduce your risk you may need to take medicine before treatment with this medicine. Take your medicine as directed. This medicine can affect the results of blood tests to match your blood type. These changes can last for up to 6 months after the final dose. Your healthcare provider will do blood tests to match your blood type before you start treatment. Tell all of your healthcare providers that you are being treated with this medicine before receiving a blood transfusion. This medicine can affect  the results of some tests used to determine treatment response; extra tests may be needed to evaluate response. Do not become pregnant while taking this medicine or for 3 months after stopping it. Women should inform their doctor if they wish to become pregnant or think they might be  pregnant. There is a potential for serious side effects to an unborn child. Talk to your health care professional or pharmacist for more information. What side effects may I notice from receiving this medicine? Side effects that you should report to your doctor or health care professional as soon as possible:  allergic reactions like skin rash, itching or hives, swelling of the face, lips, or tongue  breathing problems  chills  cough  dizziness  feeling faint or lightheaded  headache  low blood counts - this medicine may decrease the number of white blood cells, red blood cells and platelets. You may be at increased risk for infections and bleeding.  nausea, vomiting  shortness of breath  signs of decreased platelets or bleeding - bruising, pinpoint red spots on the skin, black, tarry stools, blood in the urine  signs of decreased red blood cells - unusually weak or tired, feeling faint or lightheaded, falls  signs of infection - fever or chills, cough, sore throat, pain or difficulty passing urine  signs and symptoms of liver injury like dark yellow or brown urine; general ill feeling or flu-like symptoms; light-colored stools; loss of appetite; right upper belly pain; unusually weak or tired; yellowing of the eyes or skin Side effects that usually do not require medical attention (report to your doctor or health care professional if they continue or are bothersome):  back pain  constipation  diarrhea  joint pain  muscle cramps  pain, tingling, numbness in the hands or feet  swelling of the ankles, feet, hands  tiredness  trouble sleeping This list may not describe all possible side effects. Call your doctor for medical advice about side effects. You may report side effects to FDA at 1-800-FDA-1088. Where should I keep my medicine? This drug is given in a hospital or clinic and will not be stored at home. NOTE: This sheet is a summary. It may not cover all  possible information. If you have questions about this medicine, talk to your doctor, pharmacist, or health care provider.  2020 Elsevier/Gold Standard (2019-04-23 18:10:54)   Denosumab injection What is this medicine? DENOSUMAB (den oh sue mab) slows bone breakdown. Prolia is used to treat osteoporosis in women after menopause and in men, and in people who are taking corticosteroids for 6 months or more. Delton See is used to treat a high calcium level due to cancer and to prevent bone fractures and other bone problems caused by multiple myeloma or cancer bone metastases. Delton See is also used to treat giant cell tumor of the bone. This medicine may be used for other purposes; ask your health care provider or pharmacist if you have questions. COMMON BRAND NAME(S): Prolia, XGEVA What should I tell my health care provider before I take this medicine? They need to know if you have any of these conditions:  dental disease  having surgery or tooth extraction  infection  kidney disease  low levels of calcium or Vitamin D in the blood  malnutrition  on hemodialysis  skin conditions or sensitivity  thyroid or parathyroid disease  an unusual reaction to denosumab, other medicines, foods, dyes, or preservatives  pregnant or trying to get pregnant  breast-feeding How should I use  this medicine? This medicine is for injection under the skin. It is given by a health care professional in a hospital or clinic setting. A special MedGuide will be given to you before each treatment. Be sure to read this information carefully each time. For Prolia, talk to your pediatrician regarding the use of this medicine in children. Special care may be needed. For Delton See, talk to your pediatrician regarding the use of this medicine in children. While this drug may be prescribed for children as young as 13 years for selected conditions, precautions do apply. Overdosage: If you think you have taken too much of this  medicine contact a poison control center or emergency room at once. NOTE: This medicine is only for you. Do not share this medicine with others. What if I miss a dose? It is important not to miss your dose. Call your doctor or health care professional if you are unable to keep an appointment. What may interact with this medicine? Do not take this medicine with any of the following medications:  other medicines containing denosumab This medicine may also interact with the following medications:  medicines that lower your chance of fighting infection  steroid medicines like prednisone or cortisone This list may not describe all possible interactions. Give your health care provider a list of all the medicines, herbs, non-prescription drugs, or dietary supplements you use. Also tell them if you smoke, drink alcohol, or use illegal drugs. Some items may interact with your medicine. What should I watch for while using this medicine? Visit your doctor or health care professional for regular checks on your progress. Your doctor or health care professional may order blood tests and other tests to see how you are doing. Call your doctor or health care professional for advice if you get a fever, chills or sore throat, or other symptoms of a cold or flu. Do not treat yourself. This drug may decrease your body's ability to fight infection. Try to avoid being around people who are sick. You should make sure you get enough calcium and vitamin D while you are taking this medicine, unless your doctor tells you not to. Discuss the foods you eat and the vitamins you take with your health care professional. See your dentist regularly. Brush and floss your teeth as directed. Before you have any dental work done, tell your dentist you are receiving this medicine. Do not become pregnant while taking this medicine or for 5 months after stopping it. Talk with your doctor or health care professional about your birth  control options while taking this medicine. Women should inform their doctor if they wish to become pregnant or think they might be pregnant. There is a potential for serious side effects to an unborn child. Talk to your health care professional or pharmacist for more information. What side effects may I notice from receiving this medicine? Side effects that you should report to your doctor or health care professional as soon as possible:  allergic reactions like skin rash, itching or hives, swelling of the face, lips, or tongue  bone pain  breathing problems  dizziness  jaw pain, especially after dental work  redness, blistering, peeling of the skin  signs and symptoms of infection like fever or chills; cough; sore throat; pain or trouble passing urine  signs of low calcium like fast heartbeat, muscle cramps or muscle pain; pain, tingling, numbness in the hands or feet; seizures  unusual bleeding or bruising  unusually weak or tired Side effects  that usually do not require medical attention (report to your doctor or health care professional if they continue or are bothersome):  constipation  diarrhea  headache  joint pain  loss of appetite  muscle pain  runny nose  tiredness  upset stomach This list may not describe all possible side effects. Call your doctor for medical advice about side effects. You may report side effects to FDA at 1-800-FDA-1088. Where should I keep my medicine? This medicine is only given in a clinic, doctor's office, or other health care setting and will not be stored at home. NOTE: This sheet is a summary. It may not cover all possible information. If you have questions about this medicine, talk to your doctor, pharmacist, or health care provider.  2020 Elsevier/Gold Standard (2017-12-22 16:10:44)

## 2020-07-08 NOTE — Progress Notes (Signed)
MD Magrinat met with pt in infusion suite to review new treatment regimen.  Pt remained for 2 hours post injection, tolerated well.  VS stable.

## 2020-07-14 NOTE — Progress Notes (Signed)
[place]  [phone] [phone]    ID: FERRIN LIEBIG DOB: 1960/11/09  MR#: [id number]  RJJ#:[id number]  Patient Care Team: Maurice Small, MD as PCP - General (Family Medicine) Santana Gosdin, Virgie Dad, MD as Consulting Physician (Oncology) Latanya Maudlin, MD as Consulting Physician (Orthopedic Surgery) Everlene Farrier, MD as Consulting Physician (Obstetrics and Gynecology) Domingo Pulse, MD (Urology) Ronald Lobo, MD as Consulting Physician (Gastroenterology) Markus Daft, MD as Consulting Physician (Interventional Radiology) Erline Levine, MD as Consulting Physician (Neurosurgery) Aris Lot Ramon Dredge, MD as Referring Physician (Hematology and Oncology) Milda Smart, MD as Referring Physician (Diagnostic Radiology) OTHER MD: Algis Liming. Lambird MD]  CHIEF COMPLAINT: Light chain myeloma  CURRENT TREATMENT: Bortezomib, dexamethasone; thalidomide; denosumab/Xgeva   INTERVAL HISTORY: [name] returns today for follow up and treatment of her myeloma accompanied by her husband [name].  She was switched to daratumumab on 07/08/2020 after she developed a rash from the thalidomide. She received her first daratumumab dose last week and had absolutely no side effects from it that she is aware of.  We are following the free kappa light chains in the urine and the kappa/lambda ratio in the urine Lab Results  Component Value Date   KPAFRELGTCHN 398.9 (H) 06/17/2020   KPAFRELGTCHN 1,244.5 (H) 04/15/2020   KPAFRELGTCHN 919.4 (H) 03/05/2020   Lab Results  Component Value Date   KAPLAMBRATIO 920.08 (H) 06/26/2020   KAPLAMBRATIO 88.64 (H) 06/17/2020   KAPLAMBRATIO 2,329.41 (H) 05/20/2020   KAPLAMBRATIO 0,160.10 (H) 04/23/2020   KAPLAMBRATIO 366.03 (H) 04/15/2020   She continues on denosumab/Xgeva every 4 weeks, with her most recent dose on 57/13/2021.   REVIEW OF SYSTEMS: Seanna has much less back pain than before the kyphoplasty, but she still has a  little bit of discomfort. Sometimes she takes Motrin for that. She has developed what looks like a paper cut in the PIP joint of the left index finger. This is erythematous and slightly swollen. She has no peripheral neuropathy symptoms. A detailed review of systems today was stable.   COVID 19 VACCINATION STATUS:    HISTORY OF CURRENT ILLNESS: From the original intake note:  SHIARA Wilson is a 59 y.o. woman that presented with low and thoracic back pain to Dr. Latanya Maudlin. She notes muscle spasms and a low grade fever of 100 degrees.   Labs completed on 02/19/2020 revealed a CBC that was within normal limits except for RBC 2.39, Hgb 8.8, HCT 26.3, MCV 110.0, MCH 36.8, RDW 17.7, AMC 91. CMP was within normal limits except for potassium 3.3, CO2 35. Uric Acid was within normal limits. Urine Protein was abnormal at 57. C-reactive protein was within normal limits. ANA screening was negative.   Thorasic Spine MRI on 02/22/2020 revealed diffuse marrow T1 hypointensity involving vertibral bodies and some spinous processes associated with mild STTR hyperintensity with mild wedge compression fracture of T7 and mild right superior endplate compression fracture of T11 that are suspicious for pathologic fractures. There is mild central zone narrowing at mid T7 with minimal-deformity of the cord. Differential considerationsinclude Lymphoma, multiple myeloma, and diffuse metastatic disease. There is partially visualized mild-moderate spinal canal stenosis and mild deformity of the cord at C5-6 and C6-7 probable moderate-severe to severe-neutral foraminal narrowing which would be better assessed dedicated MRI of the cervical spine as clinically warranted.   Lab Results  Component Value Date   TOTALPROTELP 6.1 03/05/2020    Lab Results  Component Value Date   KPAFRELGTCHN 919.4 (H) 03/05/2020  LAMBDASER 7.0 03/05/2020   KAPLAMBRATIO 131.34 (H) 03/05/2020   Of note she had a CT of the abdomen on  [facility]/03/2017 at [facility] which showed a well-defined sclerotic lesion in the left iliac bone consistent with a bone island and no lytic or sclerotic lesions anywhere else.   PAST MEDICAL HISTORY: Past Medical History:  Diagnosis Date  . Hypertension   . Kidney stones   . Kidney stones     PAST SURGICAL HISTORY: Past Surgical History:  Procedure Laterality Date  . CESAREAN SECTION    . CYSTOSCOPY WITH RETROGRADE PYELOGRAM, URETEROSCOPY AND STENT PLACEMENT Right 09/16/2015   Procedure: CYSTOSCOPY WITH RETROGRADE PYELOGRAM, URETEROSCOPY AND STENT PLACEMENT WITH HOLMIUM LASER ABLATION ;  Surgeon: Alexis Frock, MD;  Location: [facility];  Service: Urology;  Laterality: Right;  . CYSTOSCOPY/RETROGRADE/URETEROSCOPY  06/04/2012   Procedure: CYSTOSCOPY/RETROGRADE/URETEROSCOPY;  Surgeon: Ailene Rud, MD;  Location: [facility];  Service: Urology;  Laterality: Left;  . IR RADIOLOGIST EVAL & MGMT  04/29/2020  . UTERINE FIBROID EMBOLIZATION      FAMILY HISTORY: No family history on file. The patient has 1 brother, 2 sisters.  Both her parents died in their 62s.  There is no cancer history in the family to her knowledge   GYNECOLOGIC HISTORY:  No LMP recorded. Patient has had an ablation. 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  Her 2 sons are from her first marriage, [name] who is a Administrator, arts at [employer] who graduated from [employer] in Merchandiser, retail.  In 2016 she married her second husband, [name].  He is a Loss adjuster, chartered for [employer].  He has 3 children of his own living in [place] and [place].    The patient attends [employer] DIRECTIVES: In the absence of any documents to the contrary the patient's husband is her healthcare power of  attorney   HEALTH MAINTENANCE: Social History   Tobacco Use  . Smoking status: Never Smoker  . Smokeless tobacco: Never Used  Substance Use Topics  . Alcohol use: No  . Drug use: No    Colonoscopy: Due  PAP: Up-to-date  Bone density: Osteopenia Mammography: Up-to-date  Allergies  Allergen Reactions  . Sulfa Drugs Cross Reactors     From childhood per pt's mother , pt is unaware of allergy status    Current Outpatient Medications  Medication Sig Dispense Refill  . albuterol (PROVENTIL HFA;VENTOLIN HFA) 108 (90 Base) MCG/ACT inhaler Inhale 1-2 puffs into the lungs every 6 (six) hours as needed for wheezing or shortness of breath.    . calcium carbonate (OS-CAL - DOSED IN MG OF ELEMENTAL CALCIUM) 1250 MG tablet Take 1 tablet by mouth daily.      . cephALEXin (KEFLEX) 500 MG capsule Take 1 capsule (500 mg total) by mouth 2 (two) times daily. 6 capsule 0  . dexamethasone (DECADRON) 4 MG tablet Take 5 tablets (20 mg total) by mouth once a week. Take the day after each daratumumab dose. 100 tablet 1  . hydrOXYzine (ATARAX/VISTARIL) 10 MG tablet Take 1 tablet (10 mg total) by mouth every 4 (four) hours as needed for anxiety. 30 tablet 0  . losartan-hydrochlorothiazide (HYZAAR) 100-25 MG per tablet Take 1 tablet by mouth daily.     Marland Kitchen  Multiple Vitamins-Minerals (MULTIVITAMIN PO) Take 1 tablet by mouth daily.     . potassium chloride (KLOR-CON) 10 MEQ tablet Take 2 tablets (20 mEq total) by mouth 2 (two) times daily. 120 tablet 0  . sertraline (ZOLOFT) 50 MG tablet Take 75 mg by mouth daily.    . traZODone (DESYREL) 50 MG tablet Take 50 mg by mouth at bedtime.    . valACYclovir (VALTREX) 1000 MG tablet Take 1 tablet (1,000 mg total) by mouth 2 (two) times daily. (Patient taking differently: Take 1,000 mg by mouth daily. ) 30 tablet 6  . warfarin (COUMADIN) 5 MG tablet Take 1 tablet (5 mg total) by mouth daily. 60 tablet 6   No current facility-administered medications for this visit.    Facility-Administered Medications Ordered in Other Visits  Medication Dose Route Frequency Provider Last Rate Last Admin  . 0.9 %  sodium chloride infusion   Intravenous Once Ruchi Stoney, Virgie Dad, MD        OBJECTIVE: White woman who appears younger than stated age  19:   07/15/20 1017  BP: 96/78  Pulse: (!) 103  Resp: (!) 98  Temp: (!) 97 F (36.1 C)   Wt Readings from Last 3 Encounters:  07/15/20 129 lb 14.4 oz (58.9 kg)  07/01/20 127 lb 6 oz (57.8 kg)  06/24/20 126 lb 8 oz (57.4 kg)   Body mass index is 18.64 kg/m.    ECOG FS:1 - Symptomatic but completely ambulatory  Sclerae unicteric, EOMs intact Wearing a mask No cervical or supraclavicular adenopathy Lungs no rales or rhonchi Heart regular rate and rhythm Abd soft, nontender, positive bowel sounds MSK no focal spinal tenderness, left index finger cellulitis as described above Neuro: nonfocal, well oriented, appropriate affect Breasts: Deferred   LAB RESULTS:  CMP     Component Value Date/Time   NA 143 07/15/2020 0952   K 3.2 (L) 07/15/2020 0952   CL 103 07/15/2020 0952   CO2 32 07/15/2020 0952   GLUCOSE 91 07/15/2020 0952   BUN 17 07/15/2020 0952   CREATININE 0.68 07/15/2020 0952   CALCIUM 9.1 07/15/2020 0952   PROT 6.1 (L) 07/15/2020 0952   ALBUMIN 3.8 07/15/2020 0952   AST 19 07/15/2020 0952   ALT 32 07/15/2020 0952   ALKPHOS 96 07/15/2020 0952   BILITOT 0.5 07/15/2020 0952   GFRNONAA >60 07/15/2020 0952   GFRAA >60 05/27/2020 1428    Lab Results  Component Value Date   TOTALPROTELP 5.4 (L) 06/17/2020   ALBUMINELP 3.5 06/17/2020   A1GS 0.3 06/17/2020   A2GS 0.7 06/17/2020   BETS 0.7 06/17/2020   GAMS 0.3 (L) 06/17/2020   MSPIKE Not Observed 06/17/2020   SPEI Comment 06/17/2020     Lab Results  Component Value Date   KPAFRELGTCHN 398.9 (H) 06/17/2020   LAMBDASER 4.5 (L) 06/17/2020   KAPLAMBRATIO 920.08 (H) 06/26/2020    Lab Results  Component Value Date   WBC 4.0  07/15/2020   NEUTROABS 2.7 07/15/2020   HGB 10.2 (L) 07/15/2020   HCT 31.5 (L) 07/15/2020   MCV 105.4 (H) 07/15/2020   PLT 231 07/15/2020    No results found for: LABCA2  No components found for: QGBEEF007  Recent Labs  Lab 07/15/20 0951  INR 2.6*    No results found for: LABCA2  No results found for: HQR975  No results found for: OIT254  No results found for: DIY641  No results found for: CA2729  No components found for: HGQUANT  No results  found for: CEA1 / No results found for: CEA1   No results found for: AFPTUMOR  No results found for: CHROMOGRNA  No results found for: HGBA, HGBA2QUANT, HGBFQUANT, HGBSQUAN (Hemoglobinopathy evaluation)   Lab Results  Component Value Date   LDH 240 (H) 04/08/2020    No results found for: IRON, TIBC, IRONPCTSAT (Iron and TIBC)  No results found for: FERRITIN  Urinalysis    Component Value Date/Time   COLORURINE YELLOW 09/15/2015 2315   APPEARANCEUR CLOUDY (A) 09/15/2015 2315   LABSPEC 1.014 09/15/2015 2315   PHURINE 8.5 (H) 09/15/2015 2315   GLUCOSEU NEGATIVE 09/15/2015 2315   HGBUR SMALL (A) 09/15/2015 2315   BILIRUBINUR NEGATIVE 09/15/2015 2315   KETONESUR 15 (A) 09/15/2015 2315   PROTEINUR NEGATIVE 09/15/2015 2315   UROBILINOGEN 1.0 05/24/2013 1913   NITRITE NEGATIVE 09/15/2015 2315   LEUKOCYTESUR TRACE (A) 09/15/2015 2315    STUDIES:  No results found.   ELIGIBLE FOR AVAILABLE RESEARCH PROTOCOL: no   ASSESSMENT: 59 y.o. [place] woman presenting June 2021 with severe back pain, thoracic MRI 02/22/2020 suggestive of possible myeloma.  Work-up included:  (a) 03/05/2020 Myeloma panel shows no M protein but all immunoglobulins are decreased.  The lambda ratio is markedly abnormal at 131.34; review of the blood film is not diagnostic  (b) 24-hour urine: markedly elevated Kappa light chains at 9,656; urine kappa/lambda ratio 4180.24, and urine M spike of 239.  (c) bone survey on 03/16/2020  shows rounded lucencies throughout the skull which could be venous lakes  (d) bone marrow biopsy on 7/27 confirms multiple myeloma with 44% plasma cells on the aspirate by manual differential, and 70 to 80% on the biopsy by CD138 immunohistochemistry  (e) cytogenetics  03/24/2020: no metaphase cells available for analysis  (f) myeloma FISH studies 03/24/2020 show t(11,14), with NO del(17p), t(1/14) or t(14.16)  (g) on 04/08/2020 LDH 240, beta-2-microglobulin 4.4, albumin 4.2  (h) R-ISS Stage: II   (1) bortezomib/dexamethasone started 03/31/2020, discontinued after 05/20/2020 dose  (a) Lenalidomide added on 04/21/2020, stopped on 8/26 after two doses secondary to rash, resumed 04/29/2020 again with a very poor tolerance, treated with Medrol Dosepak, third attempt 05/14/2020, again unable to tolerate   (b) originally on ASA and acyclovir for supportive care, changed September 2021  (2) denosumab/Xgeva started 05/06/2020  (3) thalidomide added 06/04/2020 to bortezomib/ dexamethasone 05/27/2020  (a) to start warfarin after planned kyphoplasty, continuing Valtrex 1000 mg daily  (4) s/p kyphoplasty to T7, T11 and T12 on 06/09/2020 at [place]: Aliece tolerated the daratumumab without any unusual side effects or really any side effects at all that she is aware of. We are continuing that weekly for now. She will have 20 mg of Decadron before the dose and then 20 mg the next day to prevent late reactions.  I am putting her on Keflex 500 mg twice daily for 3 days to help clear the left index finger infection.  We are going to repeat a 24-hour urine at 07/29/2020 so that she will have those results when she returns to [facility] a week later.  At this point I feel she does not need to see is quite so frequently so she will see me the week after the visit to [facility]. She knows to call us for any other issue that may develop before then.  Total encounter time 35 minutes.Sarajane Jews C.  Edvardo Honse, MD 07/15/20 5:20 PM Medical Oncology and Hematology [facility] [place], [place] [address] Tel. [phone]    Fax. [phone]   I, Wilburn Mylar, am acting as scribe for Dr. Virgie Dad. Smitty Ackerley.  I, Lurline Del MD, have reviewed the above documentation for accuracy and completeness, and I agree with the above.   *Total Encounter Time as defined by the Centers for Medicare and Medicaid Services includes, in addition to the face-to-face time of a patient visit (documented in the note above) non-face-to-face time: obtaining and reviewing outside history, ordering and reviewing medications, tests or procedures, care coordination (communications with other health care professionals or caregivers) and documentation in the medical record.

## 2020-07-15 ENCOUNTER — Other Ambulatory Visit: Payer: Self-pay

## 2020-07-15 ENCOUNTER — Telehealth: Payer: Self-pay | Admitting: Oncology

## 2020-07-15 ENCOUNTER — Inpatient Hospital Stay (HOSPITAL_BASED_OUTPATIENT_CLINIC_OR_DEPARTMENT_OTHER): Payer: BC Managed Care – PPO | Admitting: Oncology

## 2020-07-15 ENCOUNTER — Inpatient Hospital Stay: Payer: BC Managed Care – PPO

## 2020-07-15 ENCOUNTER — Encounter: Payer: Self-pay | Admitting: Oncology

## 2020-07-15 VITALS — BP 96/78 | HR 103 | Temp 97.0°F | Resp 98 | Ht 70.0 in | Wt 129.9 lb

## 2020-07-15 VITALS — BP 102/72 | HR 88 | Temp 98.2°F | Resp 18

## 2020-07-15 DIAGNOSIS — Z5112 Encounter for antineoplastic immunotherapy: Secondary | ICD-10-CM | POA: Diagnosis not present

## 2020-07-15 DIAGNOSIS — C9000 Multiple myeloma not having achieved remission: Secondary | ICD-10-CM

## 2020-07-15 DIAGNOSIS — Z7189 Other specified counseling: Secondary | ICD-10-CM

## 2020-07-15 LAB — COMPREHENSIVE METABOLIC PANEL
ALT: 32 U/L (ref 0–44)
AST: 19 U/L (ref 15–41)
Albumin: 3.8 g/dL (ref 3.5–5.0)
Alkaline Phosphatase: 96 U/L (ref 38–126)
Anion gap: 8 (ref 5–15)
BUN: 17 mg/dL (ref 6–20)
CO2: 32 mmol/L (ref 22–32)
Calcium: 9.1 mg/dL (ref 8.9–10.3)
Chloride: 103 mmol/L (ref 98–111)
Creatinine, Ser: 0.68 mg/dL (ref 0.44–1.00)
GFR, Estimated: >60 mL/min (ref >60)
Glucose, Bld: 91 mg/dL (ref 70–99)
Potassium: 3.2 mmol/L — ABNORMAL LOW (ref 3.5–5.1)
Sodium: 143 mmol/L (ref 135–145)
Total Bilirubin: 0.5 mg/dL (ref 0.3–1.2)
Total Protein: 6.1 g/dL — ABNORMAL LOW (ref 6.5–8.1)

## 2020-07-15 LAB — CBC WITH DIFFERENTIAL (CANCER CENTER ONLY)
Abs Immature Granulocytes: 0.01 10*3/uL (ref 0.00–0.07)
Basophils Absolute: 0 10*3/uL (ref 0.0–0.1)
Basophils Relative: 1 %
Eosinophils Absolute: 0.1 10*3/uL (ref 0.0–0.5)
Eosinophils Relative: 3 %
HCT: 31.5 % — ABNORMAL LOW (ref 36.0–46.0)
Hemoglobin: 10.2 g/dL — ABNORMAL LOW (ref 12.0–15.0)
Immature Granulocytes: 0 %
Lymphocytes Relative: 24 %
Lymphs Abs: 1 10*3/uL (ref 0.7–4.0)
MCH: 34.1 pg — ABNORMAL HIGH (ref 26.0–34.0)
MCHC: 32.4 g/dL (ref 30.0–36.0)
MCV: 105.4 fL — ABNORMAL HIGH (ref 80.0–100.0)
Monocytes Absolute: 0.2 10*3/uL (ref 0.1–1.0)
Monocytes Relative: 5 %
Neutro Abs: 2.7 10*3/uL (ref 1.7–7.7)
Neutrophils Relative %: 67 %
Platelet Count: 231 10*3/uL (ref 150–400)
RBC: 2.99 MIL/uL — ABNORMAL LOW (ref 3.87–5.11)
RDW: 21.6 % — ABNORMAL HIGH (ref 11.5–15.5)
WBC Count: 4 10*3/uL (ref 4.0–10.5)
nRBC: 0.5 % — ABNORMAL HIGH (ref 0.0–0.2)

## 2020-07-15 LAB — PROTIME-INR
INR: 2.6 — ABNORMAL HIGH (ref 0.8–1.2)
Prothrombin Time: 27 seconds — ABNORMAL HIGH (ref 11.4–15.2)

## 2020-07-15 MED ORDER — DEXAMETHASONE 4 MG PO TABS
ORAL_TABLET | ORAL | Status: AC
  Filled 2020-07-15: qty 5

## 2020-07-15 MED ORDER — MONTELUKAST SODIUM 10 MG PO TABS
ORAL_TABLET | ORAL | Status: AC
  Filled 2020-07-15: qty 1

## 2020-07-15 MED ORDER — CEPHALEXIN 500 MG PO CAPS: 500.0000 mg | ORAL_CAPSULE | Freq: Two times a day (BID) | ORAL | 0 refills | Status: DC

## 2020-07-15 MED ORDER — DIPHENHYDRAMINE HCL 25 MG PO CAPS
50.0000 mg | ORAL_CAPSULE | Freq: Once | ORAL | Status: AC
  Administered 2020-07-15: 50 mg via ORAL

## 2020-07-15 MED ORDER — ACETAMINOPHEN 325 MG PO TABS
650.0000 mg | ORAL_TABLET | Freq: Once | ORAL | Status: AC
  Administered 2020-07-15: 650 mg via ORAL

## 2020-07-15 MED ORDER — DARATUMUMAB-HYALURONIDASE-FIHJ 1800-30000 MG-UT/15ML ~~LOC~~ SOLN
1800.0000 mg | Freq: Once | SUBCUTANEOUS | Status: AC
  Administered 2020-07-15: 1800 mg via SUBCUTANEOUS
  Filled 2020-07-15: qty 15

## 2020-07-15 MED ORDER — DIPHENHYDRAMINE HCL 25 MG PO CAPS
ORAL_CAPSULE | ORAL | Status: AC
  Filled 2020-07-15: qty 2

## 2020-07-15 MED ORDER — MONTELUKAST SODIUM 10 MG PO TABS
10.0000 mg | ORAL_TABLET | Freq: Once | ORAL | Status: AC
  Administered 2020-07-15: 10 mg via ORAL

## 2020-07-15 MED ORDER — DEXAMETHASONE 4 MG PO TABS
20.0000 mg | ORAL_TABLET | Freq: Once | ORAL | Status: AC
  Administered 2020-07-15: 20 mg via ORAL

## 2020-07-15 MED ORDER — BORTEZOMIB CHEMO SQ INJECTION 3.5 MG (2.5MG/ML)
1.3000 mg/m2 | Freq: Once | INTRAMUSCULAR | Status: AC
  Administered 2020-07-15: 2.25 mg via SUBCUTANEOUS
  Filled 2020-07-15: qty 0.9

## 2020-07-15 MED ORDER — ACETAMINOPHEN 325 MG PO TABS
ORAL_TABLET | ORAL | Status: AC
  Filled 2020-07-15: qty 2

## 2020-07-15 MED ORDER — SODIUM CHLORIDE 0.9 % IV SOLN
Freq: Once | INTRAVENOUS | Status: DC
  Filled 2020-07-15: qty 250

## 2020-07-15 NOTE — Telephone Encounter (Signed)
Scheduled appts per 11/17 los. Gave pt a print out of AVS.

## 2020-07-15 NOTE — Progress Notes (Signed)
Patent waited her full 1 hour post observation with no issues VSS

## 2020-07-20 ENCOUNTER — Other Ambulatory Visit: Payer: Self-pay

## 2020-07-20 MED ORDER — DEXAMETHASONE 4 MG PO TABS
20.0000 mg | ORAL_TABLET | ORAL | 1 refills | Status: DC
Start: 2020-07-20 — End: 2021-03-30

## 2020-07-20 NOTE — Telephone Encounter (Signed)
Pt called stating she has misplaced her Decadron and is asking if we can send in another Rx for it. Per Dr Jana Hakim this is OK, and I explained to pt's spouse, [name] that her insurance may not pay for it since it was last filled on 07/08/20. Worthy Keeler to try [facility] in case medication is expensive.

## 2020-07-22 ENCOUNTER — Other Ambulatory Visit: Payer: Self-pay

## 2020-07-22 ENCOUNTER — Inpatient Hospital Stay: Payer: BC Managed Care – PPO

## 2020-07-22 ENCOUNTER — Other Ambulatory Visit: Payer: BC Managed Care – PPO

## 2020-07-22 ENCOUNTER — Ambulatory Visit: Payer: BC Managed Care – PPO | Admitting: Oncology

## 2020-07-22 VITALS — BP 133/80 | HR 95 | Temp 98.2°F | Resp 18 | Wt 130.5 lb

## 2020-07-22 DIAGNOSIS — C9000 Multiple myeloma not having achieved remission: Secondary | ICD-10-CM

## 2020-07-22 DIAGNOSIS — Z7189 Other specified counseling: Secondary | ICD-10-CM

## 2020-07-22 DIAGNOSIS — Z5112 Encounter for antineoplastic immunotherapy: Secondary | ICD-10-CM | POA: Diagnosis not present

## 2020-07-22 LAB — COMPREHENSIVE METABOLIC PANEL
ALT: 66 U/L — ABNORMAL HIGH (ref 0–44)
AST: 26 U/L (ref 15–41)
Albumin: 3.7 g/dL (ref 3.5–5.0)
Alkaline Phosphatase: 97 U/L (ref 38–126)
Anion gap: 9 (ref 5–15)
BUN: 17 mg/dL (ref 6–20)
CO2: 29 mmol/L (ref 22–32)
Calcium: 8.7 mg/dL — ABNORMAL LOW (ref 8.9–10.3)
Chloride: 104 mmol/L (ref 98–111)
Creatinine, Ser: 0.68 mg/dL (ref 0.44–1.00)
GFR, Estimated: >60 mL/min (ref >60)
Glucose, Bld: 109 mg/dL — ABNORMAL HIGH (ref 70–99)
Potassium: 3.3 mmol/L — ABNORMAL LOW (ref 3.5–5.1)
Sodium: 142 mmol/L (ref 135–145)
Total Bilirubin: 0.4 mg/dL (ref 0.3–1.2)
Total Protein: 5.8 g/dL — ABNORMAL LOW (ref 6.5–8.1)

## 2020-07-22 LAB — CBC WITH DIFFERENTIAL (CANCER CENTER ONLY)
Abs Immature Granulocytes: 0 10*3/uL (ref 0.00–0.07)
Basophils Absolute: 0 10*3/uL (ref 0.0–0.1)
Basophils Relative: 1 %
Eosinophils Absolute: 0.1 10*3/uL (ref 0.0–0.5)
Eosinophils Relative: 3 %
HCT: 30.4 % — ABNORMAL LOW (ref 36.0–46.0)
Hemoglobin: 9.9 g/dL — ABNORMAL LOW (ref 12.0–15.0)
Immature Granulocytes: 0 %
Lymphocytes Relative: 33 %
Lymphs Abs: 1.3 10*3/uL (ref 0.7–4.0)
MCH: 34.7 pg — ABNORMAL HIGH (ref 26.0–34.0)
MCHC: 32.6 g/dL (ref 30.0–36.0)
MCV: 106.7 fL — ABNORMAL HIGH (ref 80.0–100.0)
Monocytes Absolute: 0.2 10*3/uL (ref 0.1–1.0)
Monocytes Relative: 6 %
Neutro Abs: 2.2 10*3/uL (ref 1.7–7.7)
Neutrophils Relative %: 57 %
Platelet Count: 286 10*3/uL (ref 150–400)
RBC: 2.85 MIL/uL — ABNORMAL LOW (ref 3.87–5.11)
RDW: 20.7 % — ABNORMAL HIGH (ref 11.5–15.5)
WBC Count: 3.8 10*3/uL — ABNORMAL LOW (ref 4.0–10.5)
nRBC: 0 % (ref 0.0–0.2)

## 2020-07-22 LAB — PROTIME-INR
INR: 2.1 — ABNORMAL HIGH (ref 0.8–1.2)
Prothrombin Time: 22.9 seconds — ABNORMAL HIGH (ref 11.4–15.2)

## 2020-07-22 MED ORDER — DEXAMETHASONE 4 MG PO TABS
20.0000 mg | ORAL_TABLET | Freq: Once | ORAL | Status: AC
  Administered 2020-07-22: 20 mg via ORAL

## 2020-07-22 MED ORDER — MONTELUKAST SODIUM 10 MG PO TABS
10.0000 mg | ORAL_TABLET | Freq: Once | ORAL | Status: AC
  Administered 2020-07-22: 10 mg via ORAL

## 2020-07-22 MED ORDER — MONTELUKAST SODIUM 10 MG PO TABS
ORAL_TABLET | ORAL | Status: AC
  Filled 2020-07-22: qty 1

## 2020-07-22 MED ORDER — ACETAMINOPHEN 325 MG PO TABS
650.0000 mg | ORAL_TABLET | Freq: Once | ORAL | Status: AC
  Administered 2020-07-22: 650 mg via ORAL

## 2020-07-22 MED ORDER — ACETAMINOPHEN 325 MG PO TABS
ORAL_TABLET | ORAL | Status: AC
  Filled 2020-07-22: qty 2

## 2020-07-22 MED ORDER — DIPHENHYDRAMINE HCL 25 MG PO CAPS
50.0000 mg | ORAL_CAPSULE | Freq: Once | ORAL | Status: AC
  Administered 2020-07-22: 50 mg via ORAL

## 2020-07-22 MED ORDER — BORTEZOMIB CHEMO SQ INJECTION 3.5 MG (2.5MG/ML)
1.3000 mg/m2 | Freq: Once | INTRAMUSCULAR | Status: AC
  Administered 2020-07-22: 2.25 mg via SUBCUTANEOUS
  Filled 2020-07-22: qty 0.9

## 2020-07-22 MED ORDER — DEXAMETHASONE 4 MG PO TABS
ORAL_TABLET | ORAL | Status: AC
  Filled 2020-07-22: qty 5

## 2020-07-22 MED ORDER — DARATUMUMAB-HYALURONIDASE-FIHJ 1800-30000 MG-UT/15ML ~~LOC~~ SOLN
1800.0000 mg | Freq: Once | SUBCUTANEOUS | Status: AC
  Administered 2020-07-22: 1800 mg via SUBCUTANEOUS
  Filled 2020-07-22: qty 15

## 2020-07-22 MED ORDER — DIPHENHYDRAMINE HCL 25 MG PO CAPS
ORAL_CAPSULE | ORAL | Status: AC
  Filled 2020-07-22: qty 2

## 2020-07-22 NOTE — Patient Instructions (Signed)
[facility] Discharge Instructions for Patients Receiving Chemotherapy  Today you received the following chemotherapy agents: Velcade & Darzalex Faspro injection  To help prevent nausea and vomiting after your treatment, we encourage you to take your nausea medication as directed.  If you develop nausea and vomiting that is not controlled by your nausea medication, call the clinic.   BELOW ARE SYMPTOMS THAT SHOULD BE REPORTED IMMEDIATELY:  *FEVER GREATER THAN 100.5 F  *CHILLS WITH OR WITHOUT FEVER  NAUSEA AND VOMITING THAT IS NOT CONTROLLED WITH YOUR NAUSEA MEDICATION  *UNUSUAL SHORTNESS OF BREATH  *UNUSUAL BRUISING OR BLEEDING  TENDERNESS IN MOUTH AND THROAT WITH OR WITHOUT PRESENCE OF ULCERS  *URINARY PROBLEMS  *BOWEL PROBLEMS  UNUSUAL RASH Items with * indicate a potential emergency and should be followed up as soon as possible.  Feel free to call the clinic should you have any questions or concerns. The clinic phone number is [phone].  Please show the CHEMO ALERT CARD at check-in to the Emergency Department and triage nurse.    Daratumumab injection What is this medicine? DARATUMUMAB (dar a toom ue mab) is a monoclonal antibody. It is used to treat multiple myeloma. This medicine may be used for other purposes; ask your health care provider or pharmacist if you have questions. COMMON BRAND NAME(S): DARZALEX What should I tell my health care provider before I take this medicine? They need to know if you have any of these conditions:  infection (especially a virus infection such as chickenpox, herpes, or hepatitis B virus)  lung or breathing disease  an unusual or allergic reaction to daratumumab, other medicines, foods, dyes, or preservatives  pregnant or trying to get pregnant  breast-feeding How should I use this medicine? This medicine is for infusion into a vein. It is given by a health care professional in a hospital or clinic  setting. Talk to your pediatrician regarding the use of this medicine in children. Special care may be needed. Overdosage: If you think you have taken too much of this medicine contact a poison control center or emergency room at once. NOTE: This medicine is only for you. Do not share this medicine with others. What if I miss a dose? Keep appointments for follow-up doses as directed. It is important not to miss your dose. Call your doctor or health care professional if you are unable to keep an appointment. What may interact with this medicine? Interactions have not been studied. This list may not describe all possible interactions. Give your health care provider a list of all the medicines, herbs, non-prescription drugs, or dietary supplements you use. Also tell them if you smoke, drink alcohol, or use illegal drugs. Some items may interact with your medicine. What should I watch for while using this medicine? This drug may make you feel generally unwell. Report any side effects. Continue your course of treatment even though you feel ill unless your doctor tells you to stop. This medicine can cause serious allergic reactions. To reduce your risk you may need to take medicine before treatment with this medicine. Take your medicine as directed. This medicine can affect the results of blood tests to match your blood type. These changes can last for up to 6 months after the final dose. Your healthcare provider will do blood tests to match your blood type before you start treatment. Tell all of your healthcare providers that you are being treated with this medicine before receiving a blood transfusion. This medicine can affect  the results of some tests used to determine treatment response; extra tests may be needed to evaluate response. Do not become pregnant while taking this medicine or for 3 months after stopping it. Women should inform their doctor if they wish to become pregnant or think they might be  pregnant. There is a potential for serious side effects to an unborn child. Talk to your health care professional or pharmacist for more information. What side effects may I notice from receiving this medicine? Side effects that you should report to your doctor or health care professional as soon as possible:  allergic reactions like skin rash, itching or hives, swelling of the face, lips, or tongue  breathing problems  chills  cough  dizziness  feeling faint or lightheaded  headache  low blood counts - this medicine may decrease the number of white blood cells, red blood cells and platelets. You may be at increased risk for infections and bleeding.  nausea, vomiting  shortness of breath  signs of decreased platelets or bleeding - bruising, pinpoint red spots on the skin, black, tarry stools, blood in the urine  signs of decreased red blood cells - unusually weak or tired, feeling faint or lightheaded, falls  signs of infection - fever or chills, cough, sore throat, pain or difficulty passing urine  signs and symptoms of liver injury like dark yellow or brown urine; general ill feeling or flu-like symptoms; light-colored stools; loss of appetite; right upper belly pain; unusually weak or tired; yellowing of the eyes or skin Side effects that usually do not require medical attention (report to your doctor or health care professional if they continue or are bothersome):  back pain  constipation  diarrhea  joint pain  muscle cramps  pain, tingling, numbness in the hands or feet  swelling of the ankles, feet, hands  tiredness  trouble sleeping This list may not describe all possible side effects. Call your doctor for medical advice about side effects. You may report side effects to FDA at 1-800-FDA-1088. Where should I keep my medicine? This drug is given in a hospital or clinic and will not be stored at home. NOTE: This sheet is a summary. It may not cover all  possible information. If you have questions about this medicine, talk to your doctor, pharmacist, or health care provider.  2020 Elsevier/Gold Standard (2019-04-23 18:10:54)   Denosumab injection What is this medicine? DENOSUMAB (den oh sue mab) slows bone breakdown. Prolia is used to treat osteoporosis in women after menopause and in men, and in people who are taking corticosteroids for 6 months or more. Delton See is used to treat a high calcium level due to cancer and to prevent bone fractures and other bone problems caused by multiple myeloma or cancer bone metastases. Delton See is also used to treat giant cell tumor of the bone. This medicine may be used for other purposes; ask your health care provider or pharmacist if you have questions. COMMON BRAND NAME(S): Prolia, XGEVA What should I tell my health care provider before I take this medicine? They need to know if you have any of these conditions:  dental disease  having surgery or tooth extraction  infection  kidney disease  low levels of calcium or Vitamin D in the blood  malnutrition  on hemodialysis  skin conditions or sensitivity  thyroid or parathyroid disease  an unusual reaction to denosumab, other medicines, foods, dyes, or preservatives  pregnant or trying to get pregnant  breast-feeding How should I use  this medicine? This medicine is for injection under the skin. It is given by a health care professional in a hospital or clinic setting. A special MedGuide will be given to you before each treatment. Be sure to read this information carefully each time. For Prolia, talk to your pediatrician regarding the use of this medicine in children. Special care may be needed. For Delton See, talk to your pediatrician regarding the use of this medicine in children. While this drug may be prescribed for children as young as 13 years for selected conditions, precautions do apply. Overdosage: If you think you have taken too much of this  medicine contact a poison control center or emergency room at once. NOTE: This medicine is only for you. Do not share this medicine with others. What if I miss a dose? It is important not to miss your dose. Call your doctor or health care professional if you are unable to keep an appointment. What may interact with this medicine? Do not take this medicine with any of the following medications:  other medicines containing denosumab This medicine may also interact with the following medications:  medicines that lower your chance of fighting infection  steroid medicines like prednisone or cortisone This list may not describe all possible interactions. Give your health care provider a list of all the medicines, herbs, non-prescription drugs, or dietary supplements you use. Also tell them if you smoke, drink alcohol, or use illegal drugs. Some items may interact with your medicine. What should I watch for while using this medicine? Visit your doctor or health care professional for regular checks on your progress. Your doctor or health care professional may order blood tests and other tests to see how you are doing. Call your doctor or health care professional for advice if you get a fever, chills or sore throat, or other symptoms of a cold or flu. Do not treat yourself. This drug may decrease your body's ability to fight infection. Try to avoid being around people who are sick. You should make sure you get enough calcium and vitamin D while you are taking this medicine, unless your doctor tells you not to. Discuss the foods you eat and the vitamins you take with your health care professional. See your dentist regularly. Brush and floss your teeth as directed. Before you have any dental work done, tell your dentist you are receiving this medicine. Do not become pregnant while taking this medicine or for 5 months after stopping it. Talk with your doctor or health care professional about your birth  control options while taking this medicine. Women should inform their doctor if they wish to become pregnant or think they might be pregnant. There is a potential for serious side effects to an unborn child. Talk to your health care professional or pharmacist for more information. What side effects may I notice from receiving this medicine? Side effects that you should report to your doctor or health care professional as soon as possible:  allergic reactions like skin rash, itching or hives, swelling of the face, lips, or tongue  bone pain  breathing problems  dizziness  jaw pain, especially after dental work  redness, blistering, peeling of the skin  signs and symptoms of infection like fever or chills; cough; sore throat; pain or trouble passing urine  signs of low calcium like fast heartbeat, muscle cramps or muscle pain; pain, tingling, numbness in the hands or feet; seizures  unusual bleeding or bruising  unusually weak or tired Side effects  that usually do not require medical attention (report to your doctor or health care professional if they continue or are bothersome):  constipation  diarrhea  headache  joint pain  loss of appetite  muscle pain  runny nose  tiredness  upset stomach This list may not describe all possible side effects. Call your doctor for medical advice about side effects. You may report side effects to FDA at 1-800-FDA-1088. Where should I keep my medicine? This medicine is only given in a clinic, doctor's office, or other health care setting and will not be stored at home. NOTE: This sheet is a summary. It may not cover all possible information. If you have questions about this medicine, talk to your doctor, pharmacist, or health care provider.  2020 Elsevier/Gold Standard (2017-12-22 16:10:44)

## 2020-07-24 LAB — KAPPA/LAMBDA LIGHT CHAINS
Kappa free light chain: 242.9 mg/L — ABNORMAL HIGH (ref 3.3–19.4)
Kappa, lambda light chain ratio: 97.16 — ABNORMAL HIGH (ref 0.26–1.65)
Lambda free light chains: 2.5 mg/L — ABNORMAL LOW (ref 5.7–26.3)

## 2020-07-24 LAB — PROTEIN ELECTROPHORESIS, SERUM
A/G Ratio: 2.2 — ABNORMAL HIGH (ref 0.7–1.7)
Albumin ELP: 3.7 g/dL (ref 2.9–4.4)
Alpha-1-Globulin: 0.2 g/dL (ref 0.0–0.4)
Alpha-2-Globulin: 0.4 g/dL (ref 0.4–1.0)
Beta Globulin: 0.8 g/dL (ref 0.7–1.3)
Gamma Globulin: 0.3 g/dL — ABNORMAL LOW (ref 0.4–1.8)
Globulin, Total: 1.7 g/dL — ABNORMAL LOW (ref 2.2–3.9)
M-Spike, %: 0.1 g/dL — ABNORMAL HIGH
Total Protein ELP: 5.4 g/dL — ABNORMAL LOW (ref 6.0–8.5)

## 2020-07-28 ENCOUNTER — Telehealth: Payer: Self-pay

## 2020-07-28 NOTE — Telephone Encounter (Signed)
Pt called and LVM asking about 24 hr urine. This LPN call pt back to explain to her, per Dr Magrinat's last note pt will come in 07/29/20 to collect 24 hr urine and have infusion. Pt understands this and is in agreement.

## 2020-07-29 ENCOUNTER — Inpatient Hospital Stay: Payer: BC Managed Care – PPO | Attending: Oncology

## 2020-07-29 ENCOUNTER — Inpatient Hospital Stay: Payer: BC Managed Care – PPO

## 2020-07-29 ENCOUNTER — Other Ambulatory Visit: Payer: Self-pay

## 2020-07-29 VITALS — BP 143/88 | HR 94 | Temp 98.3°F | Resp 18

## 2020-07-29 DIAGNOSIS — Z5112 Encounter for antineoplastic immunotherapy: Secondary | ICD-10-CM | POA: Diagnosis not present

## 2020-07-29 DIAGNOSIS — Z7189 Other specified counseling: Secondary | ICD-10-CM

## 2020-07-29 DIAGNOSIS — C9000 Multiple myeloma not having achieved remission: Secondary | ICD-10-CM | POA: Insufficient documentation

## 2020-07-29 LAB — COMPREHENSIVE METABOLIC PANEL
ALT: 37 U/L (ref 0–44)
AST: 17 U/L (ref 15–41)
Albumin: 3.7 g/dL (ref 3.5–5.0)
Alkaline Phosphatase: 96 U/L (ref 38–126)
Anion gap: 7 (ref 5–15)
BUN: 17 mg/dL (ref 6–20)
CO2: 31 mmol/L (ref 22–32)
Calcium: 9.5 mg/dL (ref 8.9–10.3)
Chloride: 105 mmol/L (ref 98–111)
Creatinine, Ser: 0.68 mg/dL (ref 0.44–1.00)
GFR, Estimated: >60 mL/min (ref >60)
Glucose, Bld: 84 mg/dL (ref 70–99)
Potassium: 3.4 mmol/L — ABNORMAL LOW (ref 3.5–5.1)
Sodium: 143 mmol/L (ref 135–145)
Total Bilirubin: 0.4 mg/dL (ref 0.3–1.2)
Total Protein: 5.8 g/dL — ABNORMAL LOW (ref 6.5–8.1)

## 2020-07-29 LAB — CBC WITH DIFFERENTIAL (CANCER CENTER ONLY)
Abs Immature Granulocytes: 0.01 10*3/uL (ref 0.00–0.07)
Basophils Absolute: 0 10*3/uL (ref 0.0–0.1)
Basophils Relative: 0 %
Eosinophils Absolute: 0.1 10*3/uL (ref 0.0–0.5)
Eosinophils Relative: 4 %
HCT: 31.8 % — ABNORMAL LOW (ref 36.0–46.0)
Hemoglobin: 10.5 g/dL — ABNORMAL LOW (ref 12.0–15.0)
Immature Granulocytes: 0 %
Lymphocytes Relative: 36 %
Lymphs Abs: 1.2 10*3/uL (ref 0.7–4.0)
MCH: 35.6 pg — ABNORMAL HIGH (ref 26.0–34.0)
MCHC: 33 g/dL (ref 30.0–36.0)
MCV: 107.8 fL — ABNORMAL HIGH (ref 80.0–100.0)
Monocytes Absolute: 0.2 10*3/uL (ref 0.1–1.0)
Monocytes Relative: 7 %
Neutro Abs: 1.7 10*3/uL (ref 1.7–7.7)
Neutrophils Relative %: 53 %
Platelet Count: 262 10*3/uL (ref 150–400)
RBC: 2.95 MIL/uL — ABNORMAL LOW (ref 3.87–5.11)
RDW: 19.6 % — ABNORMAL HIGH (ref 11.5–15.5)
WBC Count: 3.2 10*3/uL — ABNORMAL LOW (ref 4.0–10.5)
nRBC: 0 % (ref 0.0–0.2)

## 2020-07-29 LAB — PROTIME-INR
INR: 1.7 — ABNORMAL HIGH (ref 0.8–1.2)
Prothrombin Time: 19.7 seconds — ABNORMAL HIGH (ref 11.4–15.2)

## 2020-07-29 MED ORDER — DEXAMETHASONE 4 MG PO TABS
20.0000 mg | ORAL_TABLET | Freq: Once | ORAL | Status: AC
  Administered 2020-07-29: 20 mg via ORAL

## 2020-07-29 MED ORDER — ACETAMINOPHEN 325 MG PO TABS
ORAL_TABLET | ORAL | Status: AC
  Filled 2020-07-29: qty 2

## 2020-07-29 MED ORDER — DARATUMUMAB-HYALURONIDASE-FIHJ 1800-30000 MG-UT/15ML ~~LOC~~ SOLN
1800.0000 mg | Freq: Once | SUBCUTANEOUS | Status: AC
  Administered 2020-07-29: 1800 mg via SUBCUTANEOUS
  Filled 2020-07-29: qty 15

## 2020-07-29 MED ORDER — DIPHENHYDRAMINE HCL 25 MG PO CAPS
50.0000 mg | ORAL_CAPSULE | Freq: Once | ORAL | Status: AC
  Administered 2020-07-29: 50 mg via ORAL

## 2020-07-29 MED ORDER — DIPHENHYDRAMINE HCL 25 MG PO CAPS
ORAL_CAPSULE | ORAL | Status: AC
  Filled 2020-07-29: qty 2

## 2020-07-29 MED ORDER — DEXAMETHASONE 4 MG PO TABS
ORAL_TABLET | ORAL | Status: AC
  Filled 2020-07-29: qty 5

## 2020-07-29 MED ORDER — BORTEZOMIB CHEMO SQ INJECTION 3.5 MG (2.5MG/ML)
1.3000 mg/m2 | Freq: Once | INTRAMUSCULAR | Status: AC
  Administered 2020-07-29: 2.25 mg via SUBCUTANEOUS
  Filled 2020-07-29: qty 0.9

## 2020-07-29 MED ORDER — ACETAMINOPHEN 325 MG PO TABS
650.0000 mg | ORAL_TABLET | Freq: Once | ORAL | Status: AC
  Administered 2020-07-29: 650 mg via ORAL

## 2020-07-29 NOTE — Progress Notes (Signed)
Pt stable for d/c, tolerated tx well, ambulatory w/belongings.

## 2020-07-29 NOTE — Patient Instructions (Signed)
[facility] Discharge Instructions for Patients Receiving Chemotherapy  Today you received the following chemotherapy agents: Velcade & Darzalex Faspro injection  To help prevent nausea and vomiting after your treatment, we encourage you to take your nausea medication as directed.  If you develop nausea and vomiting that is not controlled by your nausea medication, call the clinic.   BELOW ARE SYMPTOMS THAT SHOULD BE REPORTED IMMEDIATELY:  *FEVER GREATER THAN 100.5 F  *CHILLS WITH OR WITHOUT FEVER  NAUSEA AND VOMITING THAT IS NOT CONTROLLED WITH YOUR NAUSEA MEDICATION  *UNUSUAL SHORTNESS OF BREATH  *UNUSUAL BRUISING OR BLEEDING  TENDERNESS IN MOUTH AND THROAT WITH OR WITHOUT PRESENCE OF ULCERS  *URINARY PROBLEMS  *BOWEL PROBLEMS  UNUSUAL RASH Items with * indicate a potential emergency and should be followed up as soon as possible.  Feel free to call the clinic should you have any questions or concerns. The clinic phone number is [phone].  Please show the [place] at check-in to the Emergency Department and triage nurse.

## 2020-07-31 DIAGNOSIS — Z5112 Encounter for antineoplastic immunotherapy: Secondary | ICD-10-CM | POA: Diagnosis not present

## 2020-07-31 DIAGNOSIS — C9000 Multiple myeloma not having achieved remission: Secondary | ICD-10-CM | POA: Diagnosis not present

## 2020-08-01 ENCOUNTER — Encounter: Payer: Self-pay | Admitting: Oncology

## 2020-08-01 LAB — KAPPA/LAMBDA LIGHT CHAINS, FREE, WITH RATIO, 24HR. URINE
FR KAPPA LT CH,24HR: 60.36 mg/(24.h)
FR LAMBDA LT CH,24HR: <0.8 mg/(24.h)
Free Kappa Lt Chains,Ur: 50.3 mg/L (ref 0.63–113.79)
Free Kappa/Lambda Ratio: >75.07 — ABNORMAL HIGH (ref 1.03–31.76)
Free Lambda Lt Chains,Ur: <0.67 mg/L (ref 0.47–11.77)
Total Volume: 1200

## 2020-08-04 ENCOUNTER — Other Ambulatory Visit: Payer: Self-pay | Admitting: Oncology

## 2020-08-04 MED ORDER — TOBRAMYCIN-DEXAMETHASONE 0.3-0.1 % OP SUSP: 1.0000 [drp] | Freq: Two times a day (BID) | OPHTHALMIC | 0 refills | Status: DC

## 2020-08-04 NOTE — Progress Notes (Unsigned)
I called Robin Wilson and discussed the very promising report of her latest light chains in the urine.  The report is phrased strangely as greater than 75.  We are trying to get that clarified.  The lab director was not there yesterday so hopefully we can get that done today.  She already has an appointment with Dr. Aris Lot at [facility] 08/06/2020.  Very likely she will have a bone marrow biopsy there to confirm and proceed to stem cell harvest at his discretion  She is having some eye irritation.  I am calling [employer] in for her

## 2020-08-05 ENCOUNTER — Other Ambulatory Visit: Payer: Self-pay | Admitting: Oncology

## 2020-08-05 ENCOUNTER — Inpatient Hospital Stay: Payer: BC Managed Care – PPO

## 2020-08-05 ENCOUNTER — Other Ambulatory Visit: Payer: Self-pay

## 2020-08-05 VITALS — BP 130/82 | HR 92 | Temp 98.0°F | Resp 16 | Ht 69.0 in | Wt 132.5 lb

## 2020-08-05 DIAGNOSIS — Z7189 Other specified counseling: Secondary | ICD-10-CM

## 2020-08-05 DIAGNOSIS — C9000 Multiple myeloma not having achieved remission: Secondary | ICD-10-CM

## 2020-08-05 DIAGNOSIS — Z5112 Encounter for antineoplastic immunotherapy: Secondary | ICD-10-CM | POA: Diagnosis not present

## 2020-08-05 LAB — PROTIME-INR
INR: 2 — ABNORMAL HIGH (ref 0.8–1.2)
Prothrombin Time: 22.2 seconds — ABNORMAL HIGH (ref 11.4–15.2)

## 2020-08-05 LAB — CBC WITH DIFFERENTIAL (CANCER CENTER ONLY)
Abs Immature Granulocytes: 0.01 10*3/uL (ref 0.00–0.07)
Basophils Absolute: 0 10*3/uL (ref 0.0–0.1)
Basophils Relative: 0 %
Eosinophils Absolute: 0.1 10*3/uL (ref 0.0–0.5)
Eosinophils Relative: 3 %
HCT: 32.3 % — ABNORMAL LOW (ref 36.0–46.0)
Hemoglobin: 10.5 g/dL — ABNORMAL LOW (ref 12.0–15.0)
Immature Granulocytes: 0 %
Lymphocytes Relative: 29 %
Lymphs Abs: 1.3 10*3/uL (ref 0.7–4.0)
MCH: 34.7 pg — ABNORMAL HIGH (ref 26.0–34.0)
MCHC: 32.5 g/dL (ref 30.0–36.0)
MCV: 106.6 fL — ABNORMAL HIGH (ref 80.0–100.0)
Monocytes Absolute: 0.4 10*3/uL (ref 0.1–1.0)
Monocytes Relative: 9 %
Neutro Abs: 2.6 10*3/uL (ref 1.7–7.7)
Neutrophils Relative %: 59 %
Platelet Count: 280 10*3/uL (ref 150–400)
RBC: 3.03 MIL/uL — ABNORMAL LOW (ref 3.87–5.11)
RDW: 18.5 % — ABNORMAL HIGH (ref 11.5–15.5)
WBC Count: 4.5 10*3/uL (ref 4.0–10.5)
nRBC: 0 % (ref 0.0–0.2)

## 2020-08-05 LAB — COMPREHENSIVE METABOLIC PANEL
ALT: 22 U/L (ref 0–44)
AST: 19 U/L (ref 15–41)
Albumin: 3.8 g/dL (ref 3.5–5.0)
Alkaline Phosphatase: 96 U/L (ref 38–126)
Anion gap: 6 (ref 5–15)
BUN: 18 mg/dL (ref 6–20)
CO2: 33 mmol/L — ABNORMAL HIGH (ref 22–32)
Calcium: 9.3 mg/dL (ref 8.9–10.3)
Chloride: 103 mmol/L (ref 98–111)
Creatinine, Ser: 0.7 mg/dL (ref 0.44–1.00)
GFR, Estimated: >60 mL/min (ref >60)
Glucose, Bld: 80 mg/dL (ref 70–99)
Potassium: 3.4 mmol/L — ABNORMAL LOW (ref 3.5–5.1)
Sodium: 142 mmol/L (ref 135–145)
Total Bilirubin: 0.5 mg/dL (ref 0.3–1.2)
Total Protein: 6 g/dL — ABNORMAL LOW (ref 6.5–8.1)

## 2020-08-05 MED ORDER — DENOSUMAB 120 MG/1.7ML ~~LOC~~ SOLN
120.0000 mg | Freq: Once | SUBCUTANEOUS | Status: AC
  Administered 2020-08-05: 120 mg via SUBCUTANEOUS

## 2020-08-05 MED ORDER — DEXAMETHASONE 4 MG PO TABS
20.0000 mg | ORAL_TABLET | Freq: Once | ORAL | Status: AC
  Administered 2020-08-05: 20 mg via ORAL

## 2020-08-05 MED ORDER — DIPHENHYDRAMINE HCL 25 MG PO CAPS
50.0000 mg | ORAL_CAPSULE | Freq: Once | ORAL | Status: AC
  Administered 2020-08-05: 50 mg via ORAL

## 2020-08-05 MED ORDER — ACETAMINOPHEN 325 MG PO TABS
650.0000 mg | ORAL_TABLET | Freq: Once | ORAL | Status: AC
  Administered 2020-08-05: 650 mg via ORAL

## 2020-08-05 MED ORDER — ACETAMINOPHEN 325 MG PO TABS
ORAL_TABLET | ORAL | Status: AC
  Filled 2020-08-05: qty 2

## 2020-08-05 MED ORDER — DARATUMUMAB-HYALURONIDASE-FIHJ 1800-30000 MG-UT/15ML ~~LOC~~ SOLN
1800.0000 mg | Freq: Once | SUBCUTANEOUS | Status: AC
  Administered 2020-08-05: 1800 mg via SUBCUTANEOUS
  Filled 2020-08-05: qty 15

## 2020-08-05 MED ORDER — BORTEZOMIB CHEMO SQ INJECTION 3.5 MG (2.5MG/ML)
1.3000 mg/m2 | Freq: Once | INTRAMUSCULAR | Status: AC
  Administered 2020-08-05: 2.25 mg via SUBCUTANEOUS
  Filled 2020-08-05: qty 0.9

## 2020-08-05 MED ORDER — DENOSUMAB 120 MG/1.7ML ~~LOC~~ SOLN
SUBCUTANEOUS | Status: AC
  Filled 2020-08-05: qty 1.7

## 2020-08-05 MED ORDER — DEXAMETHASONE 4 MG PO TABS
ORAL_TABLET | ORAL | Status: AC
  Filled 2020-08-05: qty 5

## 2020-08-05 MED ORDER — DIPHENHYDRAMINE HCL 25 MG PO CAPS
ORAL_CAPSULE | ORAL | Status: AC
  Filled 2020-08-05: qty 2

## 2020-08-05 NOTE — Progress Notes (Signed)
Robin Wilson's 24 urine collection results are extremely favorable.  The kappa light chains in the urine have gone from greater than 4000 to less than 100.  The ratio is not accurately calculated because they did not give [place] a specific number for the lambda light chains.  I discussed that with the lab.  They are willing to rerun the test on a different sample at no cost and were going to do that today  She is tolerating her treatment well.  She is going to see Dr. Aris Lot later this week and I suspect that we are going to be getting to transplant pretty soon

## 2020-08-05 NOTE — Patient Instructions (Addendum)
[facility] Discharge Instructions for Patients Receiving Chemotherapy  Today you received the following chemotherapy agents: Velcade and Darzalex Faspro  To help prevent nausea and vomiting after your treatment, we encourage you to take your nausea medication  as prescribed.    If you develop nausea and vomiting that is not controlled by your nausea medication, call the clinic.   BELOW ARE SYMPTOMS THAT SHOULD BE REPORTED IMMEDIATELY:  *FEVER GREATER THAN 100.5 F  *CHILLS WITH OR WITHOUT FEVER  NAUSEA AND VOMITING THAT IS NOT CONTROLLED WITH YOUR NAUSEA MEDICATION  *UNUSUAL SHORTNESS OF BREATH  *UNUSUAL BRUISING OR BLEEDING  TENDERNESS IN MOUTH AND THROAT WITH OR WITHOUT PRESENCE OF ULCERS  *URINARY PROBLEMS  *BOWEL PROBLEMS  UNUSUAL RASH Items with * indicate a potential emergency and should be followed up as soon as possible.  Feel free to call the clinic should you have any questions or concerns. The clinic phone number is [phone].  Please show the [place] at check-in to the Emergency Department and triage nurse.  Denosumab injection What is this medicine? DENOSUMAB (den oh sue mab) slows bone breakdown. Prolia is used to treat osteoporosis in women after menopause and in men, and in people who are taking corticosteroids for 6 months or more. Delton See is used to treat a high calcium level due to cancer and to prevent bone fractures and other bone problems caused by multiple myeloma or cancer bone metastases. Delton See is also used to treat giant cell tumor of the bone. This medicine may be used for other purposes; ask your health care provider or pharmacist if you have questions. COMMON BRAND NAME(S): Prolia, XGEVA What should I tell my health care provider before I take this medicine? They need to know if you have any of these conditions:  dental disease  having surgery or tooth extraction  infection  kidney disease  low levels of calcium  or Vitamin D in the blood  malnutrition  on hemodialysis  skin conditions or sensitivity  thyroid or parathyroid disease  an unusual reaction to denosumab, other medicines, foods, dyes, or preservatives  pregnant or trying to get pregnant  breast-feeding How should I use this medicine? This medicine is for injection under the skin. It is given by a health care professional in a hospital or clinic setting. A special MedGuide will be given to you before each treatment. Be sure to read this information carefully each time. For Prolia, talk to your pediatrician regarding the use of this medicine in children. Special care may be needed. For Delton See, talk to your pediatrician regarding the use of this medicine in children. While this drug may be prescribed for children as young as 13 years for selected conditions, precautions do apply. Overdosage: If you think you have taken too much of this medicine contact a poison control center or emergency room at once. NOTE: This medicine is only for you. Do not share this medicine with others. What if I miss a dose? It is important not to miss your dose. Call your doctor or health care professional if you are unable to keep an appointment. What may interact with this medicine? Do not take this medicine with any of the following medications:  other medicines containing denosumab This medicine may also interact with the following medications:  medicines that lower your chance of fighting infection  steroid medicines like prednisone or cortisone This list may not describe all possible interactions. Give your health care provider a list of  all the medicines, herbs, non-prescription drugs, or dietary supplements you use. Also tell them if you smoke, drink alcohol, or use illegal drugs. Some items may interact with your medicine. What should I watch for while using this medicine? Visit your doctor or health care professional for regular checks on your  progress. Your doctor or health care professional may order blood tests and other tests to see how you are doing. Call your doctor or health care professional for advice if you get a fever, chills or sore throat, or other symptoms of a cold or flu. Do not treat yourself. This drug may decrease your body's ability to fight infection. Try to avoid being around people who are sick. You should make sure you get enough calcium and vitamin D while you are taking this medicine, unless your doctor tells you not to. Discuss the foods you eat and the vitamins you take with your health care professional. See your dentist regularly. Brush and floss your teeth as directed. Before you have any dental work done, tell your dentist you are receiving this medicine. Do not become pregnant while taking this medicine or for 5 months after stopping it. Talk with your doctor or health care professional about your birth control options while taking this medicine. Women should inform their doctor if they wish to become pregnant or think they might be pregnant. There is a potential for serious side effects to an unborn child. Talk to your health care professional or pharmacist for more information. What side effects may I notice from receiving this medicine? Side effects that you should report to your doctor or health care professional as soon as possible:  allergic reactions like skin rash, itching or hives, swelling of the face, lips, or tongue  bone pain  breathing problems  dizziness  jaw pain, especially after dental work  redness, blistering, peeling of the skin  signs and symptoms of infection like fever or chills; cough; sore throat; pain or trouble passing urine  signs of low calcium like fast heartbeat, muscle cramps or muscle pain; pain, tingling, numbness in the hands or feet; seizures  unusual bleeding or bruising  unusually weak or tired Side effects that usually do not require medical attention  (report to your doctor or health care professional if they continue or are bothersome):  constipation  diarrhea  headache  joint pain  loss of appetite  muscle pain  runny nose  tiredness  upset stomach This list may not describe all possible side effects. Call your doctor for medical advice about side effects. You may report side effects to FDA at 1-800-FDA-1088. Where should I keep my medicine? This medicine is only given in a clinic, doctor's office, or other health care setting and will not be stored at home. NOTE: This sheet is a summary. It may not cover all possible information. If you have questions about this medicine, talk to your doctor, pharmacist, or health care provider.  2020 Elsevier/Gold Standard (2017-12-22 16:10:44)

## 2020-08-05 NOTE — Progress Notes (Signed)
Patient discharged from the [facility] in stable condition and with no signs of acute distress.    

## 2020-08-06 DIAGNOSIS — C9000 Multiple myeloma not having achieved remission: Secondary | ICD-10-CM | POA: Diagnosis not present

## 2020-08-07 ENCOUNTER — Other Ambulatory Visit: Payer: Self-pay | Admitting: *Deleted

## 2020-08-07 MED ORDER — WARFARIN SODIUM 5 MG PO TABS
ORAL_TABLET | ORAL | 6 refills | Status: DC
Start: 2020-08-07 — End: 2020-10-29

## 2020-08-11 NOTE — Progress Notes (Signed)
[place]  [phone] [phone]    ID: Robin Wilson DOB: 1960/09/24  MR#: [id number]  EHO#:[id number]  Patient Care Team: Maurice Small, MD as PCP - General (Family Medicine) Bayler Gehrig, Virgie Dad, MD as Consulting Physician (Oncology) Latanya Maudlin, MD as Consulting Physician (Orthopedic Surgery) Everlene Farrier, MD as Consulting Physician (Obstetrics and Gynecology) Domingo Pulse, MD (Urology) Ronald Lobo, MD as Consulting Physician (Gastroenterology) Markus Daft, MD as Consulting Physician (Interventional Radiology) Erline Levine, MD as Consulting Physician (Neurosurgery) Aris Lot Ramon Dredge, MD as Referring Physician (Hematology and Oncology) Milda Smart, MD as Referring Physician (Diagnostic Radiology) OTHER MD: Algis Liming. Lambird MD]  CHIEF COMPLAINT: Light chain myeloma  CURRENT TREATMENT: Bortezomib, dexamethasone; daratumumab; denosumab/Xgeva   INTERVAL HISTORY: Robin Wilson returns today for follow up and treatment of her myeloma accompanied by her husband Robin Wilson.  She was switched to daratumumab on 07/08/2020 after she developed a rash from the thalidomide. She has tolerated it well so far, her only symptom being fatigue.  We are following the free kappa light chains in the urine and the kappa/lambda ratio in the urine. Her most recent results from [facility] on 08/06/2020 were: Free Kappa = 75.85; Kappa/Lambda ratio = 35.12   Lab Results  Component Value Date   KAPLAMBRATIO >75.07 (H) 07/31/2020   KAPLAMBRATIO 97.16 (H) 07/22/2020   KAPLAMBRATIO 920.08 (H) 06/26/2020   KAPLAMBRATIO 88.64 (H) 06/17/2020   KAPLAMBRATIO 2,329.41 (H) 05/20/2020   She continues on denosumab/Xgeva every 4 weeks, with her most recent dose on 06/10/2020.   REVIEW OF SYSTEMS: Seham has had no problems with rash or other side effects that she is aware of since starting the daratumumab. She does find that she is more tired. She is not working at  present. There have been no intercurrent infections. There has been no bleeding. A detailed review of systems was otherwise stable.   COVID 19 VACCINATION STATUS:    HISTORY OF CURRENT ILLNESS: From the original intake note:  Robin Wilson is a 59 y.o. woman that presented with low and thoracic back pain to Dr. Latanya Maudlin. She notes muscle spasms and a low grade fever of 100 degrees.   Labs completed on 02/19/2020 revealed a CBC that was within normal limits except for RBC 2.39, Hgb 8.8, HCT 26.3, MCV 110.0, MCH 36.8, RDW 17.7, AMC 91. CMP was within normal limits except for potassium 3.3, CO2 35. Uric Acid was within normal limits. Urine Protein was abnormal at 57. C-reactive protein was within normal limits. ANA screening was negative.   Thorasic Spine MRI on 02/22/2020 revealed diffuse marrow T1 hypointensity involving vertibral bodies and some spinous processes associated with mild STTR hyperintensity with mild wedge compression fracture of T7 and mild right superior endplate compression fracture of T11 that are suspicious for pathologic fractures. There is mild central zone narrowing at mid T7 with minimal-deformity of the cord. Differential considerationsinclude Lymphoma, multiple myeloma, and diffuse metastatic disease. There is partially visualized mild-moderate spinal canal stenosis and mild deformity of the cord at C5-6 and C6-7 probable moderate-severe to severe-neutral foraminal narrowing which would be better assessed dedicated MRI of the cervical spine as clinically warranted.   Lab Results  Component Value Date   TOTALPROTELP 6.1 03/05/2020    Lab Results  Component Value Date   KPAFRELGTCHN 919.4 (H) 03/05/2020   LAMBDASER 7.0 03/05/2020   KAPLAMBRATIO 131.34 (H) 03/05/2020   Of note she had a CT of the abdomen on 01/03/2017 at [facility]  which showed a well-defined sclerotic lesion in the left iliac bone consistent with a bone island and no lytic or sclerotic lesions  anywhere else.   PAST MEDICAL HISTORY: Past Medical History:  Diagnosis Date  . Hypertension   . Kidney stones   . Kidney stones     PAST SURGICAL HISTORY: Past Surgical History:  Procedure Laterality Date  . CESAREAN SECTION    . CYSTOSCOPY WITH RETROGRADE PYELOGRAM, URETEROSCOPY AND STENT PLACEMENT Right 09/16/2015   Procedure: CYSTOSCOPY WITH RETROGRADE PYELOGRAM, URETEROSCOPY AND STENT PLACEMENT WITH HOLMIUM LASER ABLATION ;  Surgeon: Alexis Frock, MD;  Location: [facility];  Service: Urology;  Laterality: Right;  . CYSTOSCOPY/RETROGRADE/URETEROSCOPY  06/04/2012   Procedure: CYSTOSCOPY/RETROGRADE/URETEROSCOPY;  Surgeon: Ailene Rud, MD;  Location: [facility];  Service: Urology;  Laterality: Left;  . IR RADIOLOGIST EVAL & MGMT  04/29/2020  . UTERINE FIBROID EMBOLIZATION      FAMILY HISTORY: No family history on file. The patient has 1 brother, 2 sisters.  Both her parents died in their 14s.  There is no cancer history in the family to her knowledge   GYNECOLOGIC HISTORY:  No LMP recorded. Patient has had an ablation. 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  Her 2 sons are from her first marriage, Robin Wilson who is a Administrator, arts at [employer] who graduated from [employer] in Merchandiser, retail.  In 2016 she married her second husband, Robin Wilson.  He is a Loss adjuster, chartered for [employer].  He has 3 children of his own living in [place] and [place].    The patient attends [employer] DIRECTIVES: In the absence of any documents to the contrary the patient's husband is her healthcare power of attorney   HEALTH MAINTENANCE: Social History   Tobacco Use  . Smoking status: Never Smoker  . Smokeless tobacco: Never Used  Substance Use Topics   . Alcohol use: No  . Drug use: No    Colonoscopy: Due  PAP: Up-to-date  Bone density: Osteopenia Mammography: Up-to-date  Allergies  Allergen Reactions  . Sulfa Drugs Cross Reactors     From childhood per pt's mother , pt is unaware of allergy status    Current Outpatient Medications  Medication Sig Dispense Refill  . albuterol (PROVENTIL HFA;VENTOLIN HFA) 108 (90 Base) MCG/ACT inhaler Inhale 1-2 puffs into the lungs every 6 (six) hours as needed for wheezing or shortness of breath.    . calcium carbonate (OS-CAL - DOSED IN MG OF ELEMENTAL CALCIUM) 1250 MG tablet Take 1 tablet by mouth daily.      . cephALEXin (KEFLEX) 500 MG capsule Take 1 capsule (500 mg total) by mouth 2 (two) times daily. 6 capsule 0  . dexamethasone (DECADRON) 4 MG tablet Take 5 tablets (20 mg total) by mouth once a week. Take the day after each daratumumab dose. 100 tablet 1  . hydrOXYzine (ATARAX/VISTARIL) 10 MG tablet Take 1 tablet (10 mg total) by mouth every 4 (four) hours as needed for anxiety. 30 tablet 0  . losartan-hydrochlorothiazide (HYZAAR) 100-25 MG per tablet Take 1 tablet by mouth daily.     . Multiple Vitamins-Minerals (MULTIVITAMIN PO) Take 1 tablet by mouth daily.     . potassium chloride (KLOR-CON) 10 MEQ tablet Take 2 tablets (  20 mEq total) by mouth 2 (two) times daily. 120 tablet 0  . sertraline (ZOLOFT) 50 MG tablet Take 75 mg by mouth daily.    Marland Kitchen tobramycin-dexamethasone (TOBRADEX) ophthalmic solution Place 1 drop into both eyes in the morning and at bedtime. 5 mL 0  . traZODone (DESYREL) 50 MG tablet Take 50 mg by mouth at bedtime.    . valACYclovir (VALTREX) 1000 MG tablet Take 1 tablet (1,000 mg total) by mouth 2 (two) times daily. (Patient taking differently: Take 1,000 mg by mouth daily. ) 30 tablet 6  . warfarin (COUMADIN) 5 MG tablet 5 mg daily on even number days 7.5 mg on odd number days 60 tablet 6   No current facility-administered medications for this visit.    Facility-Administered Medications Ordered in Other Visits  Medication Dose Route Frequency Provider Last Rate Last Admin  . daratumumab-hyaluronidase-fihj (DARZALEX FASPRO) 1800-30000 MG-UT/15ML chemo SQ injection 1,800 mg  1,800 mg Subcutaneous Once Imogine Carvell, Virgie Dad, MD        OBJECTIVE: White woman who appears younger than stated age  54:   08/12/20 0856  BP: 125/74  Pulse: 100  Resp: 18  Temp: 98 F (36.7 C)  SpO2: 100%   Wt Readings from Last 3 Encounters:  08/12/20 135 lb (61.2 kg)  08/05/20 132 lb 8 oz (60.1 kg)  07/22/20 130 lb 8 oz (59.2 kg)   Body mass index is 19.94 kg/m.    ECOG FS:1 - Symptomatic but completely ambulatory  Sclerae unicteric, EOMs intact Wearing a mask No cervical or supraclavicular adenopathy Lungs no rales or rhonchi Heart regular rate and rhythm Abd soft, nontender, positive bowel sounds MSK no focal spinal tenderness, no upper extremity lymphedema Neuro: nonfocal, well oriented, appropriate affect Breasts: Deferred   LAB RESULTS:  CMP     Component Value Date/Time   NA 142 08/12/2020 0830   K 3.0 (L) 08/12/2020 0830   CL 101 08/12/2020 0830   CO2 33 (H) 08/12/2020 0830   GLUCOSE 83 08/12/2020 0830   BUN 15 08/12/2020 0830   CREATININE 0.71 08/12/2020 0830   CALCIUM 9.5 08/12/2020 0830   PROT 6.1 (L) 08/12/2020 0830   ALBUMIN 3.8 08/12/2020 0830   AST 20 08/12/2020 0830   ALT 28 08/12/2020 0830   ALKPHOS 104 08/12/2020 0830   BILITOT 0.5 08/12/2020 0830   GFRNONAA >60 08/12/2020 0830   GFRAA >60 05/27/2020 1428    Lab Results  Component Value Date   TOTALPROTELP 5.4 (L) 07/22/2020   ALBUMINELP 3.7 07/22/2020   A1GS 0.2 07/22/2020   A2GS 0.4 07/22/2020   BETS 0.8 07/22/2020   GAMS 0.3 (L) 07/22/2020   MSPIKE 0.1 (H) 07/22/2020   SPEI Comment 07/22/2020     Lab Results  Component Value Date   KPAFRELGTCHN 242.9 (H) 07/22/2020   LAMBDASER 2.5 (L) 07/22/2020   KAPLAMBRATIO >75.07 (H) 07/31/2020    Lab  Results  Component Value Date   WBC 3.7 (L) 08/12/2020   NEUTROABS 2.1 08/12/2020   HGB 11.3 (L) 08/12/2020   HCT 33.9 (L) 08/12/2020   MCV 104.0 (H) 08/12/2020   PLT 306 08/12/2020    No results found for: LABCA2  No components found for: OVFIEP329  Recent Labs  Lab 08/12/20 0829  INR 2.1*    No results found for: LABCA2  No results found for: JJO841  No results found for: YSA630  No results found for: ZSW109  No results found for: CA2729  No components found for: HGQUANT  No results found for: CEA1 / No results found for: CEA1   No results found for: AFPTUMOR  No results found for: CHROMOGRNA  No results found for: HGBA, HGBA2QUANT, HGBFQUANT, HGBSQUAN (Hemoglobinopathy evaluation)   Lab Results  Component Value Date   LDH 240 (H) 04/08/2020    No results found for: IRON, TIBC, IRONPCTSAT (Iron and TIBC)  No results found for: FERRITIN  Urinalysis    Component Value Date/Time   COLORURINE YELLOW 09/15/2015 2315   APPEARANCEUR CLOUDY (A) 09/15/2015 2315   LABSPEC 1.014 09/15/2015 2315   PHURINE 8.5 (H) 09/15/2015 2315   GLUCOSEU NEGATIVE 09/15/2015 2315   HGBUR SMALL (A) 09/15/2015 2315   BILIRUBINUR NEGATIVE 09/15/2015 2315   KETONESUR 15 (A) 09/15/2015 2315   PROTEINUR NEGATIVE 09/15/2015 2315   UROBILINOGEN 1.0 05/24/2013 1913   NITRITE NEGATIVE 09/15/2015 2315   LEUKOCYTESUR TRACE (A) 09/15/2015 2315    STUDIES:  No results found.   ELIGIBLE FOR AVAILABLE RESEARCH PROTOCOL: no   ASSESSMENT: 59 y.o. [place] woman presenting June 2021 with severe back pain, thoracic MRI 02/22/2020 suggestive of possible myeloma.  Work-up included:  (a) 03/05/2020 Myeloma panel shows no M protein but all immunoglobulins are decreased.  The lambda ratio is markedly abnormal at 131.34; review of the blood film is not diagnostic  (b) 24-hour urine: markedly elevated Kappa light chains at 9,656; urine kappa/lambda ratio 4180.24, and urine M  spike of 239.  (c) bone survey on 03/16/2020 shows rounded lucencies throughout the skull which could be venous lakes  (d) bone marrow biopsy on 7/27 confirms multiple myeloma with 44% plasma cells on the aspirate by manual differential, and 70 to 80% on the biopsy by CD138 immunohistochemistry  (e) cytogenetics  03/24/2020: no metaphase cells available for analysis  (f) myeloma FISH studies 03/24/2020 show t(11,14), with NO del(17p), t(1/14) or t(14.16)  (g) on 04/08/2020 LDH 240, beta-2-microglobulin 4.4, albumin 4.2  (h) R-ISS Stage: II   (1) bortezomib/dexamethasone started 03/31/2020, discontinued after 05/20/2020 dose  (a) Lenalidomide added on 04/21/2020, stopped on 8/26 after two doses secondary to rash, resumed 04/29/2020 again with a very poor tolerance, treated with Medrol Dosepak, third attempt 05/14/2020, again unable to tolerate   (b) originally on ASA and acyclovir for supportive care, changed September 2021  (2) denosumab/Xgeva started 05/06/2020  (3) thalidomide added 06/04/2020 to bortezomib/ dexamethasone 05/27/2020  (a) to start warfarin after planned kyphoplasty, continuing Valtrex 1000 mg daily  (4) s/p kyphoplasty to T7, T11 and T12 on 06/09/2020 at [place]: Joycelyn is doing well on the daratumumab/bortezomib combination and her free kappa light chains are down to 75.85, with a ratio of 35.12 when just checked at [facility]. This is a remarkable change from baseline.  She is not quite where we would like her to be pretransplant. We are accordingly continuing the daratumumab dexamethasone and bortezomib treatment as before. She is going to get a week off just before the [place] and see [place] again on January 5 when we will resume treatment on a weekly basis.  She is already looking forward to the stem cell collection sometime next year and wondering if she should go ahead and bows off her hair whenever that is done so that she does not have to go to hairdresser  when she is severely immunocompromise. She really does plan way ahead!.  Barring further instructions from [facility] the plan is to continue the treatment and I will repeat a 24-hour  urine on 09/16/2020  She knows to call for any other issue that may develop before the next visit. Total encounter time 25 minutes.Sarajane Jews C. Arnelle Nale, MD 08/12/20 10:32 AM Medical Oncology and Hematology [facility] [place], [place] [address] Tel. [phone]    Fax. [phone]   I, Wilburn Mylar, am acting as scribe for Dr. Virgie Dad. Kashonda Sarkisyan.  I, Lurline Del MD, have reviewed the above documentation for accuracy and completeness, and I agree with the above.   *Total Encounter Time as defined by the Centers for Medicare and Medicaid Services includes, in addition to the face-to-face time of a patient visit (documented in the note above) non-face-to-face time: obtaining and reviewing outside history, ordering and reviewing medications, tests or procedures, care coordination (communications with other health care professionals or caregivers) and documentation in the medical record.

## 2020-08-12 ENCOUNTER — Inpatient Hospital Stay: Payer: BC Managed Care – PPO

## 2020-08-12 ENCOUNTER — Other Ambulatory Visit: Payer: Self-pay | Admitting: Oncology

## 2020-08-12 ENCOUNTER — Telehealth: Payer: Self-pay | Admitting: *Deleted

## 2020-08-12 ENCOUNTER — Inpatient Hospital Stay (HOSPITAL_BASED_OUTPATIENT_CLINIC_OR_DEPARTMENT_OTHER): Payer: BC Managed Care – PPO | Admitting: Oncology

## 2020-08-12 ENCOUNTER — Other Ambulatory Visit: Payer: Self-pay

## 2020-08-12 VITALS — BP 125/74 | HR 100 | Temp 98.0°F | Resp 18 | Ht 69.0 in | Wt 135.0 lb

## 2020-08-12 DIAGNOSIS — Z5112 Encounter for antineoplastic immunotherapy: Secondary | ICD-10-CM | POA: Diagnosis not present

## 2020-08-12 DIAGNOSIS — C9000 Multiple myeloma not having achieved remission: Secondary | ICD-10-CM

## 2020-08-12 DIAGNOSIS — Z7189 Other specified counseling: Secondary | ICD-10-CM

## 2020-08-12 LAB — PROTIME-INR
INR: 2.1 — ABNORMAL HIGH (ref 0.8–1.2)
Prothrombin Time: 22.8 seconds — ABNORMAL HIGH (ref 11.4–15.2)

## 2020-08-12 LAB — COMPREHENSIVE METABOLIC PANEL
ALT: 28 U/L (ref 0–44)
AST: 20 U/L (ref 15–41)
Albumin: 3.8 g/dL (ref 3.5–5.0)
Alkaline Phosphatase: 104 U/L (ref 38–126)
Anion gap: 8 (ref 5–15)
BUN: 15 mg/dL (ref 6–20)
CO2: 33 mmol/L — ABNORMAL HIGH (ref 22–32)
Calcium: 9.5 mg/dL (ref 8.9–10.3)
Chloride: 101 mmol/L (ref 98–111)
Creatinine, Ser: 0.71 mg/dL (ref 0.44–1.00)
GFR, Estimated: >60 mL/min (ref >60)
Glucose, Bld: 83 mg/dL (ref 70–99)
Potassium: 3 mmol/L — ABNORMAL LOW (ref 3.5–5.1)
Sodium: 142 mmol/L (ref 135–145)
Total Bilirubin: 0.5 mg/dL (ref 0.3–1.2)
Total Protein: 6.1 g/dL — ABNORMAL LOW (ref 6.5–8.1)

## 2020-08-12 LAB — CBC WITH DIFFERENTIAL (CANCER CENTER ONLY)
Abs Immature Granulocytes: 0 10*3/uL (ref 0.00–0.07)
Basophils Absolute: 0 10*3/uL (ref 0.0–0.1)
Basophils Relative: 1 %
Eosinophils Absolute: 0.1 10*3/uL (ref 0.0–0.5)
Eosinophils Relative: 2 %
HCT: 33.9 % — ABNORMAL LOW (ref 36.0–46.0)
Hemoglobin: 11.3 g/dL — ABNORMAL LOW (ref 12.0–15.0)
Immature Granulocytes: 0 %
Lymphocytes Relative: 31 %
Lymphs Abs: 1.1 10*3/uL (ref 0.7–4.0)
MCH: 34.7 pg — ABNORMAL HIGH (ref 26.0–34.0)
MCHC: 33.3 g/dL (ref 30.0–36.0)
MCV: 104 fL — ABNORMAL HIGH (ref 80.0–100.0)
Monocytes Absolute: 0.3 10*3/uL (ref 0.1–1.0)
Monocytes Relative: 8 %
Neutro Abs: 2.1 10*3/uL (ref 1.7–7.7)
Neutrophils Relative %: 58 %
Platelet Count: 306 10*3/uL (ref 150–400)
RBC: 3.26 MIL/uL — ABNORMAL LOW (ref 3.87–5.11)
RDW: 17.2 % — ABNORMAL HIGH (ref 11.5–15.5)
WBC Count: 3.7 10*3/uL — ABNORMAL LOW (ref 4.0–10.5)
nRBC: 0 % (ref 0.0–0.2)

## 2020-08-12 MED ORDER — ACETAMINOPHEN 325 MG PO TABS
650.0000 mg | ORAL_TABLET | Freq: Once | ORAL | Status: AC
  Administered 2020-08-12: 650 mg via ORAL

## 2020-08-12 MED ORDER — ACETAMINOPHEN 325 MG PO TABS
ORAL_TABLET | ORAL | Status: AC
  Filled 2020-08-12: qty 2

## 2020-08-12 MED ORDER — DEXAMETHASONE 4 MG PO TABS
ORAL_TABLET | ORAL | Status: AC
  Filled 2020-08-12: qty 5

## 2020-08-12 MED ORDER — DIPHENHYDRAMINE HCL 25 MG PO CAPS
ORAL_CAPSULE | ORAL | Status: AC
  Filled 2020-08-12: qty 2

## 2020-08-12 MED ORDER — DIPHENHYDRAMINE HCL 25 MG PO CAPS
50.0000 mg | ORAL_CAPSULE | Freq: Once | ORAL | Status: AC
  Administered 2020-08-12: 50 mg via ORAL

## 2020-08-12 MED ORDER — BORTEZOMIB CHEMO SQ INJECTION 3.5 MG (2.5MG/ML)
1.3000 mg/m2 | Freq: Once | INTRAMUSCULAR | Status: AC
  Administered 2020-08-12: 2.25 mg via SUBCUTANEOUS
  Filled 2020-08-12: qty 0.9

## 2020-08-12 MED ORDER — DEXAMETHASONE 4 MG PO TABS
20.0000 mg | ORAL_TABLET | Freq: Once | ORAL | Status: AC
  Administered 2020-08-12: 20 mg via ORAL

## 2020-08-12 MED ORDER — DARATUMUMAB-HYALURONIDASE-FIHJ 1800-30000 MG-UT/15ML ~~LOC~~ SOLN
1800.0000 mg | Freq: Once | SUBCUTANEOUS | Status: AC
  Administered 2020-08-12: 1800 mg via SUBCUTANEOUS
  Filled 2020-08-12: qty 15

## 2020-08-12 NOTE — Patient Instructions (Addendum)
[facility] Discharge Instructions for Patients Receiving Chemotherapy  Today you received the following chemotherapy agents Daratumumab-Hyaluronidase-fihj (DARZALEX-FASPRO) & Bortezomib (VELCADE).  To help prevent nausea and vomiting after your treatment, we encourage you to take your nausea medication as prescribed.   If you develop nausea and vomiting that is not controlled by your nausea medication, call the clinic.   BELOW ARE SYMPTOMS THAT SHOULD BE REPORTED IMMEDIATELY:  *FEVER GREATER THAN 100.5 F  *CHILLS WITH OR WITHOUT FEVER  NAUSEA AND VOMITING THAT IS NOT CONTROLLED WITH YOUR NAUSEA MEDICATION  *UNUSUAL SHORTNESS OF BREATH  *UNUSUAL BRUISING OR BLEEDING  TENDERNESS IN MOUTH AND THROAT WITH OR WITHOUT PRESENCE OF ULCERS  *URINARY PROBLEMS  *BOWEL PROBLEMS  UNUSUAL RASH Items with * indicate a potential emergency and should be followed up as soon as possible.  Feel free to call the clinic should you have any questions or concerns. The clinic phone number is [phone].  Please show the [place] at check-in to the Emergency Department and triage nurse.

## 2020-08-12 NOTE — Telephone Encounter (Signed)
This RN attempted to contact pt per noted potassium level of 3.0 today per lab draw.  Per MD need to verify current dose of potassium pt is taking.  Noted ordered as 20 mEq bid per medication.  If pt not taking as prescribed needs to resume at above dosage - if pt is taking as prescribed - she needs to increase to 20 mEq TID .  Obtained identified VM of patient- message left to return call to this RN to verify current dose and further instructions.

## 2020-08-13 ENCOUNTER — Telehealth: Payer: Self-pay

## 2020-08-13 ENCOUNTER — Telehealth: Payer: Self-pay | Admitting: Oncology

## 2020-08-13 ENCOUNTER — Other Ambulatory Visit: Payer: Self-pay | Admitting: Adult Health

## 2020-08-13 DIAGNOSIS — E876 Hypokalemia: Secondary | ICD-10-CM

## 2020-08-13 NOTE — Telephone Encounter (Signed)
TC to Pt. To ask if she was taking her potassium because her potassium is low(3.0) Pt stated when she came in to see Dr. Jana Hakim she was out of her medication. New prescription sent to [facility] pharmacy in [place]. Pt. Also inquired about antinausea medication. Because she is feeling nauseous. Pt. Also informed to discontinue coumadin and to restart taking baby aspirin due to her discontinuation Of revlimid. Pt verbalized understanding.

## 2020-08-13 NOTE — Telephone Encounter (Signed)
Scheduled appts per 12/15 los. Pt stated she would refer to mychart for appts

## 2020-08-13 NOTE — Telephone Encounter (Signed)
TC from [place] at [facility] inquiring about Pt continuing coumadin because she is not on revlimid anymore. And is this something that can be changed back to ASA because at some point getting ready for stem cell transplant she will have to come off of the coumadin. Dr. Jana Hakim notified he stated Pt can return to taking asa. This was related to [facility] and Pt. Will be notified of the change.

## 2020-08-17 ENCOUNTER — Telehealth: Payer: Self-pay

## 2020-08-17 NOTE — Telephone Encounter (Signed)
TC from [place]  with BMT at [facility]  To inform that Pt is scheduled for transplant evaluation on 10/30/19/. Wells Guiles faxed Plan for Pt. To have 4 cycles completed of Dartumumab, Velcade , and Dexamethasone. She stated if she calculated right Pt. Should have that 4th treatment about Feb 9th. Fax received given to Dr. Jana Hakim to look over. Wells Guiles stated if any questions please call her at [phone]

## 2020-08-19 ENCOUNTER — Ambulatory Visit: Payer: BC Managed Care – PPO

## 2020-08-19 ENCOUNTER — Other Ambulatory Visit: Payer: BC Managed Care – PPO

## 2020-08-19 DIAGNOSIS — C9000 Multiple myeloma not having achieved remission: Secondary | ICD-10-CM | POA: Diagnosis not present

## 2020-08-20 ENCOUNTER — Other Ambulatory Visit: Payer: Self-pay | Admitting: *Deleted

## 2020-08-20 MED ORDER — POTASSIUM CHLORIDE ER 10 MEQ PO CPCR: 20.0000 meq | ORAL_CAPSULE | Freq: Two times a day (BID) | ORAL | 3 refills | Status: DC

## 2020-08-20 MED ORDER — BUFFERED ASPIRIN 325 MG PO TABS: 1.0000 | ORAL_TABLET | Freq: Every day | ORAL | 12 refills | Status: DC

## 2020-08-20 NOTE — Telephone Encounter (Signed)
This RN spoke with pt per call stating need to resume the smaller tablet of potassium replacement.  She states the current Klor Con is difficult to swallow- then causing vomiting.  Prescription for micro K sent to verified pharmacy.

## 2020-08-25 DIAGNOSIS — N281 Cyst of kidney, acquired: Secondary | ICD-10-CM | POA: Diagnosis not present

## 2020-08-25 DIAGNOSIS — N200 Calculus of kidney: Secondary | ICD-10-CM | POA: Diagnosis not present

## 2020-08-26 ENCOUNTER — Other Ambulatory Visit: Payer: Self-pay

## 2020-08-26 ENCOUNTER — Inpatient Hospital Stay: Payer: BC Managed Care – PPO

## 2020-08-26 ENCOUNTER — Encounter: Payer: Self-pay | Admitting: Oncology

## 2020-08-26 VITALS — BP 130/90 | HR 97 | Temp 98.6°F | Resp 16

## 2020-08-26 DIAGNOSIS — C9000 Multiple myeloma not having achieved remission: Secondary | ICD-10-CM

## 2020-08-26 DIAGNOSIS — Z7189 Other specified counseling: Secondary | ICD-10-CM

## 2020-08-26 DIAGNOSIS — Z5112 Encounter for antineoplastic immunotherapy: Secondary | ICD-10-CM | POA: Diagnosis not present

## 2020-08-26 LAB — COMPREHENSIVE METABOLIC PANEL
ALT: 31 U/L (ref 0–44)
AST: 25 U/L (ref 15–41)
Albumin: 4 g/dL (ref 3.5–5.0)
Alkaline Phosphatase: 83 U/L (ref 38–126)
Anion gap: 7 (ref 5–15)
BUN: 14 mg/dL (ref 6–20)
CO2: 32 mmol/L (ref 22–32)
Calcium: 9.1 mg/dL (ref 8.9–10.3)
Chloride: 103 mmol/L (ref 98–111)
Creatinine, Ser: 0.7 mg/dL (ref 0.44–1.00)
GFR, Estimated: >60 mL/min (ref >60)
Glucose, Bld: 113 mg/dL — ABNORMAL HIGH (ref 70–99)
Potassium: 3.5 mmol/L (ref 3.5–5.1)
Sodium: 142 mmol/L (ref 135–145)
Total Bilirubin: 0.4 mg/dL (ref 0.3–1.2)
Total Protein: 6.4 g/dL — ABNORMAL LOW (ref 6.5–8.1)

## 2020-08-26 LAB — PROTIME-INR
INR: 0.9 (ref 0.8–1.2)
Prothrombin Time: 12.1 seconds (ref 11.4–15.2)

## 2020-08-26 LAB — CBC WITH DIFFERENTIAL (CANCER CENTER ONLY)
Abs Immature Granulocytes: 0.01 10*3/uL (ref 0.00–0.07)
Basophils Absolute: 0 10*3/uL (ref 0.0–0.1)
Basophils Relative: 1 %
Eosinophils Absolute: 0.2 10*3/uL (ref 0.0–0.5)
Eosinophils Relative: 4 %
HCT: 37.5 % (ref 36.0–46.0)
Hemoglobin: 12.3 g/dL (ref 12.0–15.0)
Immature Granulocytes: 0 %
Lymphocytes Relative: 28 %
Lymphs Abs: 1.4 10*3/uL (ref 0.7–4.0)
MCH: 34.6 pg — ABNORMAL HIGH (ref 26.0–34.0)
MCHC: 32.8 g/dL (ref 30.0–36.0)
MCV: 105.6 fL — ABNORMAL HIGH (ref 80.0–100.0)
Monocytes Absolute: 0.4 10*3/uL (ref 0.1–1.0)
Monocytes Relative: 8 %
Neutro Abs: 3 10*3/uL (ref 1.7–7.7)
Neutrophils Relative %: 59 %
Platelet Count: 380 10*3/uL (ref 150–400)
RBC: 3.55 MIL/uL — ABNORMAL LOW (ref 3.87–5.11)
RDW: 16 % — ABNORMAL HIGH (ref 11.5–15.5)
WBC Count: 5.1 10*3/uL (ref 4.0–10.5)
nRBC: 0 % (ref 0.0–0.2)

## 2020-08-26 MED ORDER — DIPHENHYDRAMINE HCL 25 MG PO CAPS
ORAL_CAPSULE | ORAL | Status: AC
  Filled 2020-08-26: qty 2

## 2020-08-26 MED ORDER — ACETAMINOPHEN 325 MG PO TABS
650.0000 mg | ORAL_TABLET | Freq: Once | ORAL | Status: AC
  Administered 2020-08-26: 650 mg via ORAL

## 2020-08-26 MED ORDER — DIPHENHYDRAMINE HCL 25 MG PO CAPS
50.0000 mg | ORAL_CAPSULE | Freq: Once | ORAL | Status: AC
  Administered 2020-08-26: 50 mg via ORAL

## 2020-08-26 MED ORDER — ACETAMINOPHEN 325 MG PO TABS
ORAL_TABLET | ORAL | Status: AC
  Filled 2020-08-26: qty 2

## 2020-08-26 MED ORDER — DARATUMUMAB-HYALURONIDASE-FIHJ 1800-30000 MG-UT/15ML ~~LOC~~ SOLN
1800.0000 mg | Freq: Once | SUBCUTANEOUS | Status: AC
  Administered 2020-08-26: 1800 mg via SUBCUTANEOUS
  Filled 2020-08-26: qty 15

## 2020-08-26 MED ORDER — DEXAMETHASONE 4 MG PO TABS
20.0000 mg | ORAL_TABLET | Freq: Once | ORAL | Status: AC
  Administered 2020-08-26: 20 mg via ORAL

## 2020-08-26 MED ORDER — DEXAMETHASONE 4 MG PO TABS
ORAL_TABLET | ORAL | Status: AC
  Filled 2020-08-26: qty 5

## 2020-08-26 MED ORDER — BORTEZOMIB CHEMO SQ INJECTION 3.5 MG (2.5MG/ML)
1.3000 mg/m2 | Freq: Once | INTRAMUSCULAR | Status: AC
  Administered 2020-08-26: 2.25 mg via SUBCUTANEOUS
  Filled 2020-08-26: qty 0.9

## 2020-08-26 NOTE — Patient Instructions (Signed)
[facility] Discharge Instructions for Patients Receiving Chemotherapy  Today you received the following chemotherapy agents: bortezomib and daratumumab.  To help prevent nausea and vomiting after your treatment, we encourage you to take your nausea medication as directed.   If you develop nausea and vomiting that is not controlled by your nausea medication, call the clinic.   BELOW ARE SYMPTOMS THAT SHOULD BE REPORTED IMMEDIATELY:  *FEVER GREATER THAN 100.5 F  *CHILLS WITH OR WITHOUT FEVER  NAUSEA AND VOMITING THAT IS NOT CONTROLLED WITH YOUR NAUSEA MEDICATION  *UNUSUAL SHORTNESS OF BREATH  *UNUSUAL BRUISING OR BLEEDING  TENDERNESS IN MOUTH AND THROAT WITH OR WITHOUT PRESENCE OF ULCERS  *URINARY PROBLEMS  *BOWEL PROBLEMS  UNUSUAL RASH Items with * indicate a potential emergency and should be followed up as soon as possible.  Feel free to call the clinic should you have any questions or concerns. The clinic phone number is [phone].  Please show the CHEMO ALERT CARD at check-in to the Emergency Department and triage nurse.   

## 2020-08-27 LAB — KAPPA/LAMBDA LIGHT CHAINS
Kappa free light chain: 61.4 mg/L — ABNORMAL HIGH (ref 3.3–19.4)
Kappa, lambda light chain ratio: 26.7 — ABNORMAL HIGH (ref 0.26–1.65)
Lambda free light chains: 2.3 mg/L — ABNORMAL LOW (ref 5.7–26.3)

## 2020-09-01 DIAGNOSIS — F4321 Adjustment disorder with depressed mood: Secondary | ICD-10-CM | POA: Diagnosis not present

## 2020-09-02 ENCOUNTER — Inpatient Hospital Stay: Payer: 59 | Attending: Oncology

## 2020-09-02 ENCOUNTER — Inpatient Hospital Stay: Payer: 59

## 2020-09-02 ENCOUNTER — Other Ambulatory Visit: Payer: Self-pay

## 2020-09-02 VITALS — BP 125/75 | HR 100 | Temp 98.9°F | Resp 18 | Wt 135.2 lb

## 2020-09-02 DIAGNOSIS — Z5112 Encounter for antineoplastic immunotherapy: Secondary | ICD-10-CM | POA: Diagnosis not present

## 2020-09-02 DIAGNOSIS — Z7189 Other specified counseling: Secondary | ICD-10-CM

## 2020-09-02 DIAGNOSIS — C9000 Multiple myeloma not having achieved remission: Secondary | ICD-10-CM | POA: Diagnosis not present

## 2020-09-02 DIAGNOSIS — Z79899 Other long term (current) drug therapy: Secondary | ICD-10-CM | POA: Insufficient documentation

## 2020-09-02 LAB — COMPREHENSIVE METABOLIC PANEL
ALT: 19 U/L (ref 0–44)
AST: 15 U/L (ref 15–41)
Albumin: 3.9 g/dL (ref 3.5–5.0)
Alkaline Phosphatase: 89 U/L (ref 38–126)
Anion gap: 8 (ref 5–15)
BUN: 22 mg/dL — ABNORMAL HIGH (ref 6–20)
CO2: 30 mmol/L (ref 22–32)
Calcium: 9.7 mg/dL (ref 8.9–10.3)
Chloride: 104 mmol/L (ref 98–111)
Creatinine, Ser: 0.72 mg/dL (ref 0.44–1.00)
GFR, Estimated: >60 mL/min (ref >60)
Glucose, Bld: 88 mg/dL (ref 70–99)
Potassium: 4 mmol/L (ref 3.5–5.1)
Sodium: 142 mmol/L (ref 135–145)
Total Bilirubin: 0.4 mg/dL (ref 0.3–1.2)
Total Protein: 6.2 g/dL — ABNORMAL LOW (ref 6.5–8.1)

## 2020-09-02 LAB — CBC WITH DIFFERENTIAL (CANCER CENTER ONLY)
Abs Immature Granulocytes: 0.01 10*3/uL (ref 0.00–0.07)
Basophils Absolute: 0 10*3/uL (ref 0.0–0.1)
Basophils Relative: 1 %
Eosinophils Absolute: 0.3 10*3/uL (ref 0.0–0.5)
Eosinophils Relative: 7 %
HCT: 38.9 % (ref 36.0–46.0)
Hemoglobin: 13.1 g/dL (ref 12.0–15.0)
Immature Granulocytes: 0 %
Lymphocytes Relative: 31 %
Lymphs Abs: 1.4 10*3/uL (ref 0.7–4.0)
MCH: 35 pg — ABNORMAL HIGH (ref 26.0–34.0)
MCHC: 33.7 g/dL (ref 30.0–36.0)
MCV: 104 fL — ABNORMAL HIGH (ref 80.0–100.0)
Monocytes Absolute: 0.5 10*3/uL (ref 0.1–1.0)
Monocytes Relative: 10 %
Neutro Abs: 2.3 10*3/uL (ref 1.7–7.7)
Neutrophils Relative %: 51 %
Platelet Count: 268 10*3/uL (ref 150–400)
RBC: 3.74 MIL/uL — ABNORMAL LOW (ref 3.87–5.11)
RDW: 15.4 % (ref 11.5–15.5)
WBC Count: 4.6 10*3/uL (ref 4.0–10.5)
nRBC: 0 % (ref 0.0–0.2)

## 2020-09-02 MED ORDER — ACETAMINOPHEN 325 MG PO TABS
650.0000 mg | ORAL_TABLET | Freq: Once | ORAL | Status: AC
  Administered 2020-09-02: 650 mg via ORAL

## 2020-09-02 MED ORDER — DIPHENHYDRAMINE HCL 25 MG PO CAPS
ORAL_CAPSULE | ORAL | Status: AC
  Filled 2020-09-02: qty 2

## 2020-09-02 MED ORDER — DIPHENHYDRAMINE HCL 25 MG PO CAPS
50.0000 mg | ORAL_CAPSULE | Freq: Once | ORAL | Status: AC
  Administered 2020-09-02: 50 mg via ORAL

## 2020-09-02 MED ORDER — ACETAMINOPHEN 325 MG PO TABS
ORAL_TABLET | ORAL | Status: AC
  Filled 2020-09-02: qty 2

## 2020-09-02 MED ORDER — DEXAMETHASONE 4 MG PO TABS
20.0000 mg | ORAL_TABLET | Freq: Once | ORAL | Status: AC
  Administered 2020-09-02: 20 mg via ORAL

## 2020-09-02 MED ORDER — DARATUMUMAB-HYALURONIDASE-FIHJ 1800-30000 MG-UT/15ML ~~LOC~~ SOLN
1800.0000 mg | Freq: Once | SUBCUTANEOUS | Status: AC
  Administered 2020-09-02: 1800 mg via SUBCUTANEOUS
  Filled 2020-09-02: qty 15

## 2020-09-02 MED ORDER — DENOSUMAB 120 MG/1.7ML ~~LOC~~ SOLN
120.0000 mg | Freq: Once | SUBCUTANEOUS | Status: AC
  Administered 2020-09-02: 120 mg via SUBCUTANEOUS

## 2020-09-02 MED ORDER — DEXAMETHASONE 4 MG PO TABS
ORAL_TABLET | ORAL | Status: AC
  Filled 2020-09-02: qty 5

## 2020-09-02 MED ORDER — BORTEZOMIB CHEMO SQ INJECTION 3.5 MG (2.5MG/ML)
1.3000 mg/m2 | Freq: Once | INTRAMUSCULAR | Status: AC
  Administered 2020-09-02: 2.25 mg via SUBCUTANEOUS
  Filled 2020-09-02: qty 0.9

## 2020-09-02 MED ORDER — DENOSUMAB 120 MG/1.7ML ~~LOC~~ SOLN
SUBCUTANEOUS | Status: AC
  Filled 2020-09-02: qty 1.7

## 2020-09-02 NOTE — Patient Instructions (Signed)
[facility] Discharge Instructions for Patients Receiving Chemotherapy  Today you received the following chemotherapy agents: bortezomib and daratumumab.  To help prevent nausea and vomiting after your treatment, we encourage you to take your nausea medication as directed.   If you develop nausea and vomiting that is not controlled by your nausea medication, call the clinic.   BELOW ARE SYMPTOMS THAT SHOULD BE REPORTED IMMEDIATELY:  *FEVER GREATER THAN 100.5 F  *CHILLS WITH OR WITHOUT FEVER  NAUSEA AND VOMITING THAT IS NOT CONTROLLED WITH YOUR NAUSEA MEDICATION  *UNUSUAL SHORTNESS OF BREATH  *UNUSUAL BRUISING OR BLEEDING  TENDERNESS IN MOUTH AND THROAT WITH OR WITHOUT PRESENCE OF ULCERS  *URINARY PROBLEMS  *BOWEL PROBLEMS  UNUSUAL RASH Items with * indicate a potential emergency and should be followed up as soon as possible.  Feel free to call the clinic should you have any questions or concerns. The clinic phone number is [phone].  Please show the CHEMO ALERT CARD at check-in to the Emergency Department and triage nurse.   

## 2020-09-09 ENCOUNTER — Other Ambulatory Visit: Payer: BC Managed Care – PPO

## 2020-09-09 ENCOUNTER — Other Ambulatory Visit: Payer: Self-pay

## 2020-09-09 ENCOUNTER — Inpatient Hospital Stay: Payer: 59

## 2020-09-09 ENCOUNTER — Telehealth: Payer: Self-pay | Admitting: *Deleted

## 2020-09-09 VITALS — BP 124/86 | HR 107 | Temp 98.2°F | Resp 18

## 2020-09-09 DIAGNOSIS — C9000 Multiple myeloma not having achieved remission: Secondary | ICD-10-CM | POA: Diagnosis not present

## 2020-09-09 DIAGNOSIS — Z79899 Other long term (current) drug therapy: Secondary | ICD-10-CM | POA: Diagnosis not present

## 2020-09-09 DIAGNOSIS — Z5112 Encounter for antineoplastic immunotherapy: Secondary | ICD-10-CM | POA: Diagnosis not present

## 2020-09-09 LAB — CBC WITH DIFFERENTIAL (CANCER CENTER ONLY)
Abs Immature Granulocytes: 0 10*3/uL (ref 0.00–0.07)
Basophils Absolute: 0 10*3/uL (ref 0.0–0.1)
Basophils Relative: 0 %
Eosinophils Absolute: 0.4 10*3/uL (ref 0.0–0.5)
Eosinophils Relative: 7 %
HCT: 38.8 % (ref 36.0–46.0)
Hemoglobin: 13 g/dL (ref 12.0–15.0)
Immature Granulocytes: 0 %
Lymphocytes Relative: 27 %
Lymphs Abs: 1.3 10*3/uL (ref 0.7–4.0)
MCH: 34.3 pg — ABNORMAL HIGH (ref 26.0–34.0)
MCHC: 33.5 g/dL (ref 30.0–36.0)
MCV: 102.4 fL — ABNORMAL HIGH (ref 80.0–100.0)
Monocytes Absolute: 0.5 10*3/uL (ref 0.1–1.0)
Monocytes Relative: 11 %
Neutro Abs: 2.6 10*3/uL (ref 1.7–7.7)
Neutrophils Relative %: 55 %
Platelet Count: 267 10*3/uL (ref 150–400)
RBC: 3.79 MIL/uL — ABNORMAL LOW (ref 3.87–5.11)
RDW: 15 % (ref 11.5–15.5)
WBC Count: 4.8 10*3/uL (ref 4.0–10.5)
nRBC: 0 % (ref 0.0–0.2)

## 2020-09-09 LAB — COMPREHENSIVE METABOLIC PANEL
ALT: 16 U/L (ref 0–44)
AST: 17 U/L (ref 15–41)
Albumin: 3.8 g/dL (ref 3.5–5.0)
Alkaline Phosphatase: 88 U/L (ref 38–126)
Anion gap: 9 (ref 5–15)
BUN: 19 mg/dL (ref 6–20)
CO2: 29 mmol/L (ref 22–32)
Calcium: 9.5 mg/dL (ref 8.9–10.3)
Chloride: 103 mmol/L (ref 98–111)
Creatinine, Ser: 0.73 mg/dL (ref 0.44–1.00)
GFR, Estimated: >60 mL/min (ref >60)
Glucose, Bld: 87 mg/dL (ref 70–99)
Potassium: 3.9 mmol/L (ref 3.5–5.1)
Sodium: 141 mmol/L (ref 135–145)
Total Bilirubin: 0.4 mg/dL (ref 0.3–1.2)
Total Protein: 6.1 g/dL — ABNORMAL LOW (ref 6.5–8.1)

## 2020-09-09 MED ORDER — DIPHENHYDRAMINE HCL 25 MG PO CAPS
ORAL_CAPSULE | ORAL | Status: AC
  Filled 2020-09-09: qty 2

## 2020-09-09 MED ORDER — DEXAMETHASONE 4 MG PO TABS
ORAL_TABLET | ORAL | Status: AC
  Filled 2020-09-09: qty 5

## 2020-09-09 MED ORDER — DEXAMETHASONE 4 MG PO TABS
20.0000 mg | ORAL_TABLET | Freq: Once | ORAL | Status: AC
  Administered 2020-09-09: 20 mg via ORAL

## 2020-09-09 MED ORDER — DARATUMUMAB-HYALURONIDASE-FIHJ 1800-30000 MG-UT/15ML ~~LOC~~ SOLN
1800.0000 mg | Freq: Once | SUBCUTANEOUS | Status: AC
  Administered 2020-09-09: 1800 mg via SUBCUTANEOUS
  Filled 2020-09-09: qty 15

## 2020-09-09 MED ORDER — DIPHENHYDRAMINE HCL 25 MG PO CAPS
50.0000 mg | ORAL_CAPSULE | Freq: Once | ORAL | Status: AC
  Administered 2020-09-09: 50 mg via ORAL

## 2020-09-09 MED ORDER — BORTEZOMIB CHEMO SQ INJECTION 3.5 MG (2.5MG/ML)
1.3000 mg/m2 | Freq: Once | INTRAMUSCULAR | Status: DC
  Filled 2020-09-09: qty 0.9

## 2020-09-09 MED ORDER — ACETAMINOPHEN 325 MG PO TABS
ORAL_TABLET | ORAL | Status: AC
  Filled 2020-09-09: qty 2

## 2020-09-09 MED ORDER — ACETAMINOPHEN 325 MG PO TABS
650.0000 mg | ORAL_TABLET | Freq: Once | ORAL | Status: AC
  Administered 2020-09-09: 650 mg via ORAL

## 2020-09-09 NOTE — Patient Instructions (Signed)
[facility] Discharge Instructions for Patients Receiving Chemotherapy  Today you received the following chemotherapy agents darzalex faspro   To help prevent nausea and vomiting after your treatment, we encourage you to take your nausea medication as directed.    If you develop nausea and vomiting that is not controlled by your nausea medication, call the clinic.   BELOW ARE SYMPTOMS THAT SHOULD BE REPORTED IMMEDIATELY:  *FEVER GREATER THAN 100.5 F  *CHILLS WITH OR WITHOUT FEVER  NAUSEA AND VOMITING THAT IS NOT CONTROLLED WITH YOUR NAUSEA MEDICATION  *UNUSUAL SHORTNESS OF BREATH  *UNUSUAL BRUISING OR BLEEDING  TENDERNESS IN MOUTH AND THROAT WITH OR WITHOUT PRESENCE OF ULCERS  *URINARY PROBLEMS  *BOWEL PROBLEMS  UNUSUAL RASH Items with * indicate a potential emergency and should be followed up as soon as possible.  Feel free to call the clinic should you have any questions or concerns. The clinic phone number is [phone].  Please show the CHEMO ALERT CARD at check-in to the Emergency Department and triage nurse.   

## 2020-09-09 NOTE — Telephone Encounter (Signed)
Per review of pt having onset of neuropathy in bilateral hands with increased sensation in right ( pt is left handed )- CPIN is not interfering with ADL's at this time.  Per MD review - recommendation to hold velcade for today's dose- may proceed with the daratumumab as ordered.  Above reviewed with treating nurse as well pt.

## 2020-09-11 ENCOUNTER — Telehealth: Payer: Self-pay | Admitting: *Deleted

## 2020-09-11 NOTE — Telephone Encounter (Signed)
This RN returned call to pt per her VM.  Lilliam states she has noticed over the past couple of weeks of increased insomnia on nights of therapy with steroids.  She is using trazodone on these nights with little benefit.  She also uses vistaril if she doesn't use the trazodone- it does not help either.  Note above medications are beneficial if needed after 2 days post steroid use ( she gets weekly steroid therapy).  The insomnia is affecting her ADL's .  This RN discussed above and will review with MD for possible use of lorazepam on the 2 nights post treatment.  Natassja also wanted Dr Jana Hakim to know that she was seen for follow up on her known kidney stones- with recent US showing a 1mm stone in her right kidney without obstruction.  Urologist is recommending an MRI for baseline- she will proceed so she has prior to proceeding to stem cell transplant.  No other needs at this time.

## 2020-09-16 ENCOUNTER — Inpatient Hospital Stay: Payer: 59

## 2020-09-16 ENCOUNTER — Other Ambulatory Visit: Payer: BC Managed Care – PPO

## 2020-09-16 ENCOUNTER — Other Ambulatory Visit: Payer: Self-pay

## 2020-09-16 ENCOUNTER — Other Ambulatory Visit: Payer: Self-pay | Admitting: Oncology

## 2020-09-16 VITALS — BP 129/86 | HR 92 | Temp 98.8°F | Resp 17

## 2020-09-16 DIAGNOSIS — C9000 Multiple myeloma not having achieved remission: Secondary | ICD-10-CM

## 2020-09-16 DIAGNOSIS — Z5112 Encounter for antineoplastic immunotherapy: Secondary | ICD-10-CM | POA: Diagnosis not present

## 2020-09-16 DIAGNOSIS — Z79899 Other long term (current) drug therapy: Secondary | ICD-10-CM | POA: Diagnosis not present

## 2020-09-16 LAB — CBC WITH DIFFERENTIAL (CANCER CENTER ONLY)
Abs Immature Granulocytes: 0.01 10*3/uL (ref 0.00–0.07)
Basophils Absolute: 0 10*3/uL (ref 0.0–0.1)
Basophils Relative: 0 %
Eosinophils Absolute: 0.3 10*3/uL (ref 0.0–0.5)
Eosinophils Relative: 5 %
HCT: 39.2 % (ref 36.0–46.0)
Hemoglobin: 13.1 g/dL (ref 12.0–15.0)
Immature Granulocytes: 0 %
Lymphocytes Relative: 32 %
Lymphs Abs: 1.8 10*3/uL (ref 0.7–4.0)
MCH: 34.3 pg — ABNORMAL HIGH (ref 26.0–34.0)
MCHC: 33.4 g/dL (ref 30.0–36.0)
MCV: 102.6 fL — ABNORMAL HIGH (ref 80.0–100.0)
Monocytes Absolute: 0.5 10*3/uL (ref 0.1–1.0)
Monocytes Relative: 10 %
Neutro Abs: 2.9 10*3/uL (ref 1.7–7.7)
Neutrophils Relative %: 53 %
Platelet Count: 368 10*3/uL (ref 150–400)
RBC: 3.82 MIL/uL — ABNORMAL LOW (ref 3.87–5.11)
RDW: 15.1 % (ref 11.5–15.5)
WBC Count: 5.5 10*3/uL (ref 4.0–10.5)
nRBC: 0 % (ref 0.0–0.2)

## 2020-09-16 LAB — COMPREHENSIVE METABOLIC PANEL
ALT: 19 U/L (ref 0–44)
AST: 15 U/L (ref 15–41)
Albumin: 3.7 g/dL (ref 3.5–5.0)
Alkaline Phosphatase: 87 U/L (ref 38–126)
Anion gap: 9 (ref 5–15)
BUN: 20 mg/dL (ref 6–20)
CO2: 31 mmol/L (ref 22–32)
Calcium: 9.6 mg/dL (ref 8.9–10.3)
Chloride: 102 mmol/L (ref 98–111)
Creatinine, Ser: 0.79 mg/dL (ref 0.44–1.00)
GFR, Estimated: >60 mL/min (ref >60)
Glucose, Bld: 91 mg/dL (ref 70–99)
Potassium: 3.7 mmol/L (ref 3.5–5.1)
Sodium: 142 mmol/L (ref 135–145)
Total Bilirubin: 0.3 mg/dL (ref 0.3–1.2)
Total Protein: 6.1 g/dL — ABNORMAL LOW (ref 6.5–8.1)

## 2020-09-16 MED ORDER — ACETAMINOPHEN 325 MG PO TABS
ORAL_TABLET | ORAL | Status: AC
  Filled 2020-09-16: qty 2

## 2020-09-16 MED ORDER — BORTEZOMIB CHEMO SQ INJECTION 3.5 MG (2.5MG/ML)
1.3000 mg/m2 | Freq: Once | INTRAMUSCULAR | Status: AC
  Administered 2020-09-16: 2.25 mg via SUBCUTANEOUS
  Filled 2020-09-16: qty 0.9

## 2020-09-16 MED ORDER — DARATUMUMAB-HYALURONIDASE-FIHJ 1800-30000 MG-UT/15ML ~~LOC~~ SOLN
1800.0000 mg | Freq: Once | SUBCUTANEOUS | Status: AC
  Administered 2020-09-16: 1800 mg via SUBCUTANEOUS
  Filled 2020-09-16: qty 15

## 2020-09-16 MED ORDER — DIPHENHYDRAMINE HCL 25 MG PO CAPS
ORAL_CAPSULE | ORAL | Status: AC
  Filled 2020-09-16: qty 2

## 2020-09-16 MED ORDER — DEXAMETHASONE 4 MG PO TABS
ORAL_TABLET | ORAL | Status: AC
  Filled 2020-09-16: qty 5

## 2020-09-16 MED ORDER — DEXAMETHASONE 4 MG PO TABS
20.0000 mg | ORAL_TABLET | Freq: Once | ORAL | Status: AC
  Administered 2020-09-16: 20 mg via ORAL

## 2020-09-16 MED ORDER — ACETAMINOPHEN 325 MG PO TABS
650.0000 mg | ORAL_TABLET | Freq: Once | ORAL | Status: AC
Start: 2020-09-16 — End: 2020-09-16
  Administered 2020-09-16: 650 mg via ORAL

## 2020-09-16 MED ORDER — LORAZEPAM 0.5 MG PO TABS: 0.5000 mg | ORAL_TABLET | Freq: Every evening | ORAL | 0 refills | Status: DC | PRN

## 2020-09-16 MED ORDER — DIPHENHYDRAMINE HCL 25 MG PO CAPS
50.0000 mg | ORAL_CAPSULE | Freq: Once | ORAL | Status: AC
  Administered 2020-09-16: 50 mg via ORAL

## 2020-09-16 NOTE — Progress Notes (Signed)
Robin Wilson's Velcade was held last week because she complained of some numbness in the right hand.  The numbness is better.  It really does not occur every day.  It is really limited to the first 2 digits of the right hand.  I think she has a little bit of carpal tunnel affecting the radial aspect of that hand recall she is left-handed so is not going to be primarily a use issue.  And indicates the problem is improved.  We are proceeding with treatment today.  I suggested she get a wrist splint and wear it at least at night if not 24 hours and that should help as far as that problem is concerned.  She is going to have all her pretransplant tests done early March and is looking forward to finally undergoing that procedure shortly after that at [facility]

## 2020-09-17 LAB — KAPPA/LAMBDA LIGHT CHAINS, FREE, WITH RATIO, 24HR. URINE
FR KAPPA LT CH,24HR: 3.49 mg/24 hr
FR LAMBDA LT CH,24HR: <0.84 mg/24 hr
Free Kappa Lt Chains,Ur: 2.79 mg/L (ref 0.63–113.79)
Free Kappa/Lambda Ratio: >4.16 (ref 1.03–31.76)
Free Lambda Lt Chains,Ur: <0.67 mg/L (ref 0.47–11.77)
Total Volume: 1250

## 2020-09-18 ENCOUNTER — Encounter: Payer: Self-pay | Admitting: Oncology

## 2020-09-22 NOTE — Progress Notes (Signed)
[place]  [phone] [phone]    ID: Robin Wilson DOB: 21-May-1961  MR#: [id number]  TKW#:[id number]  Patient Care Team: Maurice Small, MD as PCP - General (Family Medicine) Daurice Ovando, Virgie Dad, MD as Consulting Physician (Oncology) Latanya Maudlin, MD as Consulting Physician (Orthopedic Surgery) Everlene Farrier, MD as Consulting Physician (Obstetrics and Gynecology) Domingo Pulse, MD (Urology) Ronald Lobo, MD as Consulting Physician (Gastroenterology) Markus Daft, MD as Consulting Physician (Interventional Radiology) Erline Levine, MD as Consulting Physician (Neurosurgery) Aris Lot Ramon Dredge, MD as Referring Physician (Hematology and Oncology) Milda Smart, MD as Referring Physician (Diagnostic Radiology) OTHER MD: Algis Liming. Lambird MD]  CHIEF COMPLAINT: Light chain myeloma  CURRENT TREATMENT: Bortezomib, dexamethasone; daratumumab; denosumab/Xgeva   INTERVAL HISTORY: Robin Wilson returns today for follow up and treatment of her myeloma accompanied by her husband [name].  She was switched to daratumumab on 07/08/2020 after she developed a rash from the thalidomide. She has tolerated it well so far, her only symptom being fatigue.  We are following the free kappa light chains in the urine and the kappa/lambda ratio in the urine. Her most recent results from a 24-hour urine obtained 09/16/2020 shows the free kappa light chains in the urine down to 2.79 (from 9196 at baseline), with a ratio of 4.16  Lab Results  Component Value Date   KAPLAMBRATIO >4.16 09/16/2020   KAPLAMBRATIO 26.70 (H) 08/26/2020   KAPLAMBRATIO >75.07 (H) 07/31/2020   KAPLAMBRATIO 97.16 (H) 07/22/2020   KAPLAMBRATIO 920.08 (H) 06/26/2020   She continues on denosumab/Xgeva every 4 weeks, with her most recent dose on 06/10/2020.   REVIEW OF SYSTEMS: Robin Wilson generally tolerates treatment well.  She has insomnia on days 1 and 2 from the steroids.  She has been using  lorazepam for this with only moderate success.  Today we discussed her adding Benadryl.  She has occasional headaches which she controls with Aleve or Tylenol.  She has problems with blurred vision.  She has had a low potassium although she is taking 4 potassium tablets daily and trying to eat fruit every day.  She is not hydrating quite as much as I would like but she is making a big effort.  She has a little bit of nausea with her treatment and we are adding Compazine to her treatment today as well as at home as needed.   COVID 19 VACCINATION STATUS: Status post Coca-Cola x2, waiting on the booster   HISTORY OF CURRENT ILLNESS: From the original intake note:  Robin Wilson is a 60 y.o. woman that presented with low and thoracic back pain to Dr. Latanya Maudlin. She notes muscle spasms and a low grade fever of 100 degrees.   Labs completed on 02/19/2020 revealed a CBC that was within normal limits except for RBC 2.39, Hgb 8.8, HCT 26.3, MCV 110.0, MCH 36.8, RDW 17.7, AMC 91. CMP was within normal limits except for potassium 3.3, CO2 35. Uric Acid was within normal limits. Urine Protein was abnormal at 57. C-reactive protein was within normal limits. ANA screening was negative.   Thorasic Spine MRI on 02/22/2020 revealed diffuse marrow T1 hypointensity involving vertibral bodies and some spinous processes associated with mild STTR hyperintensity with mild wedge compression fracture of T7 and mild right superior endplate compression fracture of T11 that are suspicious for pathologic fractures. There is mild central zone narrowing at mid T7 with minimal-deformity of the cord. Differential considerationsinclude Lymphoma, multiple myeloma, and diffuse metastatic disease. There is partially visualized mild-moderate  spinal canal stenosis and mild deformity of the cord at C5-6 and C6-7 probable moderate-severe to severe-neutral foraminal narrowing which would be better assessed dedicated MRI of the cervical spine  as clinically warranted.   Lab Results  Component Value Date   TOTALPROTELP 6.1 03/05/2020    Lab Results  Component Value Date   KPAFRELGTCHN 919.4 (H) 03/05/2020   LAMBDASER 7.0 03/05/2020   KAPLAMBRATIO 131.34 (H) 03/05/2020   Of note she had a CT of the abdomen on 01/03/2017 at [facility] which showed a well-defined sclerotic lesion in the left iliac bone consistent with a bone island and no lytic or sclerotic lesions anywhere else.   PAST MEDICAL HISTORY: Past Medical History:  Diagnosis Date  . Hypertension   . Kidney stones   . Kidney stones     PAST SURGICAL HISTORY: Past Surgical History:  Procedure Laterality Date  . CESAREAN SECTION    . CYSTOSCOPY WITH RETROGRADE PYELOGRAM, URETEROSCOPY AND STENT PLACEMENT Right 09/16/2015   Procedure: CYSTOSCOPY WITH RETROGRADE PYELOGRAM, URETEROSCOPY AND STENT PLACEMENT WITH HOLMIUM LASER ABLATION ;  Surgeon: Alexis Frock, MD;  Location: [facility];  Service: Urology;  Laterality: Right;  . CYSTOSCOPY/RETROGRADE/URETEROSCOPY  06/04/2012   Procedure: CYSTOSCOPY/RETROGRADE/URETEROSCOPY;  Surgeon: Ailene Rud, MD;  Location: [facility];  Service: Urology;  Laterality: Left;  . IR RADIOLOGIST EVAL & MGMT  04/29/2020  . UTERINE FIBROID EMBOLIZATION      FAMILY HISTORY: No family history on file. The patient has 1 brother, 2 sisters.  Both her parents died in their 66s.  There is no cancer history in the family to her knowledge   GYNECOLOGIC HISTORY:  No LMP recorded. Patient has had an ablation. 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  Her 2 sons are from her first marriage, [name] who is a Administrator, arts at [employer] who graduated from [employer] in Merchandiser, retail.  In 2016 she married her second  husband, [name].  He is a Loss adjuster, chartered for [employer].  He has 3 children of his own living in [place] and [place].    The patient attends [employer] DIRECTIVES: In the absence of any documents to the contrary the patient's husband is her healthcare power of attorney   HEALTH MAINTENANCE: Social History   Tobacco Use  . Smoking status: Never Smoker  . Smokeless tobacco: Never Used  Substance Use Topics  . Alcohol use: No  . Drug use: No    Colonoscopy: Due  PAP: Up-to-date  Bone density: Osteopenia Mammography: Up-to-date  Allergies  Allergen Reactions  . Sulfa Drugs Cross Reactors     From childhood per pt's mother , pt is unaware of allergy status    Current Outpatient Medications  Medication Sig Dispense Refill  . albuterol (PROVENTIL HFA;VENTOLIN HFA) 108 (90 Base) MCG/ACT inhaler Inhale 1-2 puffs into the lungs every 6 (six) hours as needed for wheezing or shortness of breath.    . Aspirin Buf,CaCarb-MgCarb-MgO, (BUFFERED ASPIRIN) 325 MG TABS Take 1 tablet by mouth daily. 30 tablet 12  . calcium carbonate (OS-CAL - DOSED IN MG OF ELEMENTAL CALCIUM) 1250 MG tablet Take 1 tablet by mouth daily.      Marland Kitchen dexamethasone (DECADRON) 4 MG tablet Take 5 tablets (20 mg total)  by mouth once a week. Take the day after each daratumumab dose. 100 tablet 1  . hydrOXYzine (ATARAX/VISTARIL) 10 MG tablet Take 1 tablet (10 mg total) by mouth every 4 (four) hours as needed for anxiety. 30 tablet 0  . LORazepam (ATIVAN) 0.5 MG tablet Take 1 tablet (0.5 mg total) by mouth at bedtime as needed for anxiety. 60 tablet 0  . losartan-hydrochlorothiazide (HYZAAR) 100-25 MG per tablet Take 1 tablet by mouth daily.     . Multiple Vitamins-Minerals (MULTIVITAMIN PO) Take 1 tablet by mouth daily.     . potassium chloride (MICRO-K) 10 MEQ CR capsule Take 2 capsules (20 mEq total) by mouth 2 (two) times daily. 120 capsule 3  . sertraline (ZOLOFT) 50 MG tablet Take 75 mg by mouth daily.     Marland Kitchen tobramycin-dexamethasone (TOBRADEX) ophthalmic solution Place 1 drop into both eyes in the morning and at bedtime. 5 mL 0  . traZODone (DESYREL) 50 MG tablet Take 50 mg by mouth at bedtime.    . valACYclovir (VALTREX) 1000 MG tablet Take 1 tablet (1,000 mg total) by mouth 2 (two) times daily. (Patient taking differently: Take 1,000 mg by mouth daily. ) 30 tablet 6  . warfarin (COUMADIN) 5 MG tablet 5 mg daily on even number days 7.5 mg on odd number days 60 tablet 6   No current facility-administered medications for this visit.    OBJECTIVE: White woman who appears younger than stated age  74:   09/23/20 0812  BP: 135/84  Pulse: (!) 106  Resp: 18  Temp: 97.7 F (36.5 C)  SpO2: 97%   Wt Readings from Last 3 Encounters:  09/23/20 137 lb (62.1 kg)  09/02/20 135 lb 4 oz (61.3 kg)  08/12/20 135 lb (61.2 kg)   Body mass index is 20.23 kg/m.    ECOG FS:1 - Symptomatic but completely ambulatory  Sclerae unicteric, EOMs intact Wearing a mask No cervical or supraclavicular adenopathy Lungs no rales or rhonchi Heart regular rate and rhythm Abd soft, nontender, positive bowel sounds MSK no focal spinal tenderness, no upper extremity lymphedema Neuro: nonfocal, well oriented, appropriate affect Breasts: Deferred   LAB RESULTS:  CMP     Component Value Date/Time   NA 142 09/23/2020 0740   K 3.2 (L) 09/23/2020 0740   CL 103 09/23/2020 0740   CO2 26 09/23/2020 0740   GLUCOSE 122 (H) 09/23/2020 0740   BUN 21 (H) 09/23/2020 0740   CREATININE 0.76 09/23/2020 0740   CALCIUM 8.8 (L) 09/23/2020 0740   PROT 6.2 (L) 09/23/2020 0740   ALBUMIN 3.7 09/23/2020 0740   AST 14 (L) 09/23/2020 0740   ALT 32 09/23/2020 0740   ALKPHOS 83 09/23/2020 0740   BILITOT 0.3 09/23/2020 0740   GFRNONAA >60 09/23/2020 0740   GFRAA >60 05/27/2020 1428    Lab Results  Component Value Date   TOTALPROTELP 5.4 (L) 07/22/2020   ALBUMINELP 3.7 07/22/2020   A1GS 0.2 07/22/2020   A2GS 0.4  07/22/2020   BETS 0.8 07/22/2020   GAMS 0.3 (L) 07/22/2020   MSPIKE 0.1 (H) 07/22/2020   SPEI Comment 07/22/2020     Lab Results  Component Value Date   KPAFRELGTCHN 61.4 (H) 08/26/2020   LAMBDASER 2.3 (L) 08/26/2020   KAPLAMBRATIO >4.16 09/16/2020    Lab Results  Component Value Date   WBC 6.0 09/23/2020   NEUTROABS 3.1 09/23/2020   HGB 13.4 09/23/2020   HCT 39.5 09/23/2020   MCV 100.5 (H) 09/23/2020   PLT  317 09/23/2020    No results found for: LABCA2  No components found for: LFYBOF751  No results for input(s): INR in the last 168 hours.  No results found for: LABCA2  No results found for: WCH852  No results found for: DPO242  No results found for: PNT614  No results found for: CA2729  No components found for: HGQUANT  No results found for: CEA1 / No results found for: CEA1   No results found for: AFPTUMOR  No results found for: CHROMOGRNA  No results found for: HGBA, HGBA2QUANT, HGBFQUANT, HGBSQUAN (Hemoglobinopathy evaluation)   Lab Results  Component Value Date   LDH 240 (H) 04/08/2020    No results found for: IRON, TIBC, IRONPCTSAT (Iron and TIBC)  No results found for: FERRITIN  Urinalysis    Component Value Date/Time   COLORURINE YELLOW 09/15/2015 2315   APPEARANCEUR CLOUDY (A) 09/15/2015 2315   LABSPEC 1.014 09/15/2015 2315   PHURINE 8.5 (H) 09/15/2015 2315   GLUCOSEU NEGATIVE 09/15/2015 2315   HGBUR SMALL (A) 09/15/2015 2315   BILIRUBINUR NEGATIVE 09/15/2015 2315   KETONESUR 15 (A) 09/15/2015 2315   PROTEINUR NEGATIVE 09/15/2015 2315   UROBILINOGEN 1.0 05/24/2013 1913   NITRITE NEGATIVE 09/15/2015 2315   LEUKOCYTESUR TRACE (A) 09/15/2015 2315    STUDIES:  No results found.   ELIGIBLE FOR AVAILABLE RESEARCH PROTOCOL: no   ASSESSMENT: 61 y.o. [place] woman presenting June 2021 with severe back pain, thoracic MRI 02/22/2020 suggestive of possible myeloma.  Work-up included:  (a) 03/05/2020 Myeloma panel  shows no M protein but all immunoglobulins are decreased.  The lambda ratio is markedly abnormal at 131.34; review of the blood film is not diagnostic  (b) 24-hour urine: markedly elevated Kappa light chains at 9,656; urine kappa/lambda ratio 4180.24, and urine M spike of 239.  (c) bone survey on 03/16/2020 shows rounded lucencies throughout the skull which could be venous lakes  (d) bone marrow biopsy on 7/27 confirms multiple myeloma with 44% plasma cells on the aspirate by manual differential, and 70 to 80% on the biopsy by CD138 immunohistochemistry  (e) cytogenetics  03/24/2020: no metaphase cells available for analysis  (f) myeloma FISH studies 03/24/2020 show t(11,14), with NO del(17p), t(1/14) or t(14.16)  (g) on 04/08/2020 LDH 240, beta-2-microglobulin 4.4, albumin 4.2  (h) R-ISS Stage: II   (1) bortezomib/dexamethasone started 03/31/2020  (a) lenalidomide added on 04/21/2020, stopped on 8/26 after two doses secondary to rash, resumed 04/29/2020 again with a very poor tolerance, treated with Medrol Dosepak, third attempt 05/14/2020, again unable to tolerate   (b) thalidomide added 06/04/2020 to bortezomib/ dexamethasone 05/27/2020, discontinued 06/24/2020 with rash  (c) daratumumab added 07/08/2020   (2) denosumab/Xgeva started 05/06/2020  (3) supportive therapy:  (a) valacyclovir 1g po BID  (b) ASA 325 mg daily  (4) s/p kyphoplasty to T7, T11 and T12 on 06/09/2020 at [facility]  (5) plan for high-dose therapy with autologous stem cell rescue March 2022 at [place]: [place] numbers are finally pretty much where we want them.  She is generally tolerating treatment well.  The neuropathy in the right hand really has at least an element of carpal tunnel and I encouraged her to wear a wrist splint as often as possible and certainly overnight to minimize those symptoms.  She has received daratumumab weekly since mid November and at this point I am going to change that to every  2 weeks.  She has developed a little bit of nausea.  We are adding Compazine in the premeds and also to take at home as needed.  Her last treatment here is scheduled for 10/28/2020 and I will see her on that day.  I believe she will proceed to stem cell collection following that with the goal of initiating transplant perhaps 11/14/2020.  She tells me she would like to receive some of those medications here which is more convenient for her and of course we will be glad to facilitate that if that is okay with [facility].  We talked about her low potassium and how to correct that.  We also discussed the issue of the booster shot.  She is unlikely to make any antibodies but she may improve her T-cell response by receiving the booster shot.  She is also a candidate for Evushed and I am adding her to our list to receive that when it becomes available.  Total encounter time 40 minutes.Robin Jews C. Sandee Bernath, MD 09/23/20 11:16 AM Medical Oncology and Hematology [facility] [place], [place] [address] Tel. [phone]    Fax. [phone]   I, Wilburn Mylar, am acting as scribe for Dr. Virgie Dad. Taneia Mealor.  I, Lurline Del MD, have reviewed the above documentation for accuracy and completeness, and I agree with the above.   *Total Encounter Time as defined by the Centers for Medicare and Medicaid Services includes, in addition to the face-to-face time of a patient visit (documented in the note above) non-face-to-face time: obtaining and reviewing outside history, ordering and reviewing medications, tests or procedures, care coordination (communications with other health care professionals or caregivers) and documentation in the medical record.

## 2020-09-23 ENCOUNTER — Inpatient Hospital Stay: Payer: 59

## 2020-09-23 ENCOUNTER — Other Ambulatory Visit: Payer: Self-pay

## 2020-09-23 ENCOUNTER — Other Ambulatory Visit: Payer: BC Managed Care – PPO

## 2020-09-23 ENCOUNTER — Inpatient Hospital Stay (HOSPITAL_BASED_OUTPATIENT_CLINIC_OR_DEPARTMENT_OTHER): Payer: 59 | Admitting: Oncology

## 2020-09-23 VITALS — BP 135/84 | HR 106 | Temp 97.7°F | Resp 18 | Ht 69.0 in | Wt 137.0 lb

## 2020-09-23 VITALS — HR 99

## 2020-09-23 DIAGNOSIS — Z7189 Other specified counseling: Secondary | ICD-10-CM

## 2020-09-23 DIAGNOSIS — T451X5A Adverse effect of antineoplastic and immunosuppressive drugs, initial encounter: Secondary | ICD-10-CM | POA: Diagnosis not present

## 2020-09-23 DIAGNOSIS — C9000 Multiple myeloma not having achieved remission: Secondary | ICD-10-CM

## 2020-09-23 DIAGNOSIS — Z5112 Encounter for antineoplastic immunotherapy: Secondary | ICD-10-CM | POA: Diagnosis not present

## 2020-09-23 DIAGNOSIS — Z79899 Other long term (current) drug therapy: Secondary | ICD-10-CM | POA: Diagnosis not present

## 2020-09-23 DIAGNOSIS — G620 Drug-induced polyneuropathy: Secondary | ICD-10-CM | POA: Diagnosis not present

## 2020-09-23 LAB — CBC WITH DIFFERENTIAL (CANCER CENTER ONLY)
Abs Immature Granulocytes: 0.02 10*3/uL (ref 0.00–0.07)
Basophils Absolute: 0 10*3/uL (ref 0.0–0.1)
Basophils Relative: 0 %
Eosinophils Absolute: 0.3 10*3/uL (ref 0.0–0.5)
Eosinophils Relative: 5 %
HCT: 39.5 % (ref 36.0–46.0)
Hemoglobin: 13.4 g/dL (ref 12.0–15.0)
Immature Granulocytes: 0 %
Lymphocytes Relative: 34 %
Lymphs Abs: 2.1 10*3/uL (ref 0.7–4.0)
MCH: 34.1 pg — ABNORMAL HIGH (ref 26.0–34.0)
MCHC: 33.9 g/dL (ref 30.0–36.0)
MCV: 100.5 fL — ABNORMAL HIGH (ref 80.0–100.0)
Monocytes Absolute: 0.5 10*3/uL (ref 0.1–1.0)
Monocytes Relative: 8 %
Neutro Abs: 3.1 10*3/uL (ref 1.7–7.7)
Neutrophils Relative %: 53 %
Platelet Count: 317 10*3/uL (ref 150–400)
RBC: 3.93 MIL/uL (ref 3.87–5.11)
RDW: 14.5 % (ref 11.5–15.5)
WBC Count: 6 10*3/uL (ref 4.0–10.5)
nRBC: 0 % (ref 0.0–0.2)

## 2020-09-23 LAB — COMPREHENSIVE METABOLIC PANEL
ALT: 32 U/L (ref 0–44)
AST: 14 U/L — ABNORMAL LOW (ref 15–41)
Albumin: 3.7 g/dL (ref 3.5–5.0)
Alkaline Phosphatase: 83 U/L (ref 38–126)
Anion gap: 13 (ref 5–15)
BUN: 21 mg/dL — ABNORMAL HIGH (ref 6–20)
CO2: 26 mmol/L (ref 22–32)
Calcium: 8.8 mg/dL — ABNORMAL LOW (ref 8.9–10.3)
Chloride: 103 mmol/L (ref 98–111)
Creatinine, Ser: 0.76 mg/dL (ref 0.44–1.00)
GFR, Estimated: >60 mL/min (ref >60)
Glucose, Bld: 122 mg/dL — ABNORMAL HIGH (ref 70–99)
Potassium: 3.2 mmol/L — ABNORMAL LOW (ref 3.5–5.1)
Sodium: 142 mmol/L (ref 135–145)
Total Bilirubin: 0.3 mg/dL (ref 0.3–1.2)
Total Protein: 6.2 g/dL — ABNORMAL LOW (ref 6.5–8.1)

## 2020-09-23 MED ORDER — PROCHLORPERAZINE MALEATE 10 MG PO TABS
ORAL_TABLET | ORAL | Status: AC
  Filled 2020-09-23: qty 1

## 2020-09-23 MED ORDER — BORTEZOMIB CHEMO SQ INJECTION 3.5 MG (2.5MG/ML)
1.3000 mg/m2 | Freq: Once | INTRAMUSCULAR | Status: AC
  Administered 2020-09-23: 2.25 mg via SUBCUTANEOUS
  Filled 2020-09-23: qty 0.9

## 2020-09-23 MED ORDER — PROCHLORPERAZINE MALEATE 5 MG PO TABS: 10.0000 mg | ORAL_TABLET | Freq: Four times a day (QID) | ORAL | 1 refills | Status: DC | PRN

## 2020-09-23 MED ORDER — DIPHENHYDRAMINE HCL 25 MG PO CAPS
25.0000 mg | ORAL_CAPSULE | Freq: Once | ORAL | Status: AC
  Administered 2020-09-23: 25 mg via ORAL

## 2020-09-23 MED ORDER — ACETAMINOPHEN 325 MG PO TABS
ORAL_TABLET | ORAL | Status: AC
  Filled 2020-09-23: qty 2

## 2020-09-23 MED ORDER — DIPHENHYDRAMINE HCL 25 MG PO CAPS
ORAL_CAPSULE | ORAL | Status: AC
  Filled 2020-09-23: qty 1

## 2020-09-23 MED ORDER — DARATUMUMAB-HYALURONIDASE-FIHJ 1800-30000 MG-UT/15ML ~~LOC~~ SOLN
1800.0000 mg | Freq: Once | SUBCUTANEOUS | Status: AC
  Administered 2020-09-23: 1800 mg via SUBCUTANEOUS
  Filled 2020-09-23: qty 15

## 2020-09-23 MED ORDER — PROCHLORPERAZINE MALEATE 5 MG PO TABS
5.0000 mg | ORAL_TABLET | Freq: Once | ORAL | Status: DC
  Filled 2020-09-23: qty 1

## 2020-09-23 MED ORDER — DEXAMETHASONE 4 MG PO TABS
ORAL_TABLET | ORAL | Status: AC
  Filled 2020-09-23: qty 5

## 2020-09-23 MED ORDER — PROCHLORPERAZINE EDISYLATE 10 MG/2ML IJ SOLN: 5.0000 mg | Freq: Once | INTRAMUSCULAR | Status: DC

## 2020-09-23 MED ORDER — ACETAMINOPHEN 325 MG PO TABS
650.0000 mg | ORAL_TABLET | Freq: Once | ORAL | Status: AC
  Administered 2020-09-23: 650 mg via ORAL

## 2020-09-23 MED ORDER — DEXAMETHASONE 4 MG PO TABS
20.0000 mg | ORAL_TABLET | Freq: Once | ORAL | Status: AC
  Administered 2020-09-23: 20 mg via ORAL

## 2020-09-23 MED ORDER — PROCHLORPERAZINE MALEATE 10 MG PO TABS
10.0000 mg | ORAL_TABLET | Freq: Once | ORAL | Status: AC
  Administered 2020-09-23: 10 mg via ORAL

## 2020-09-23 NOTE — Patient Instructions (Signed)
[facility] Discharge Instructions for Patients Receiving Chemotherapy  Today you received the following chemotherapy agents: bortezomib and daratumumab.  To help prevent nausea and vomiting after your treatment, we encourage you to take your nausea medication as directed.   If you develop nausea and vomiting that is not controlled by your nausea medication, call the clinic.   BELOW ARE SYMPTOMS THAT SHOULD BE REPORTED IMMEDIATELY:  *FEVER GREATER THAN 100.5 F  *CHILLS WITH OR WITHOUT FEVER  NAUSEA AND VOMITING THAT IS NOT CONTROLLED WITH YOUR NAUSEA MEDICATION  *UNUSUAL SHORTNESS OF BREATH  *UNUSUAL BRUISING OR BLEEDING  TENDERNESS IN MOUTH AND THROAT WITH OR WITHOUT PRESENCE OF ULCERS  *URINARY PROBLEMS  *BOWEL PROBLEMS  UNUSUAL RASH Items with * indicate a potential emergency and should be followed up as soon as possible.  Feel free to call the clinic should you have any questions or concerns. The clinic phone number is [phone].  Please show the CHEMO ALERT CARD at check-in to the Emergency Department and triage nurse.   

## 2020-09-24 DIAGNOSIS — N281 Cyst of kidney, acquired: Secondary | ICD-10-CM | POA: Diagnosis not present

## 2020-09-24 DIAGNOSIS — C9000 Multiple myeloma not having achieved remission: Secondary | ICD-10-CM | POA: Diagnosis not present

## 2020-09-24 LAB — PROTEIN ELECTROPHORESIS, SERUM
A/G Ratio: 1.5 (ref 0.7–1.7)
Albumin ELP: 3.7 g/dL (ref 2.9–4.4)
Alpha-1-Globulin: 0.2 g/dL (ref 0.0–0.4)
Alpha-2-Globulin: 0.7 g/dL (ref 0.4–1.0)
Beta Globulin: 1 g/dL (ref 0.7–1.3)
Gamma Globulin: 0.4 g/dL (ref 0.4–1.8)
Globulin, Total: 2.4 g/dL (ref 2.2–3.9)
M-Spike, %: 0.2 g/dL — ABNORMAL HIGH
Total Protein ELP: 6.1 g/dL (ref 6.0–8.5)

## 2020-09-24 LAB — KAPPA/LAMBDA LIGHT CHAINS
Kappa free light chain: 34.2 mg/L — ABNORMAL HIGH (ref 3.3–19.4)
Kappa, lambda light chain ratio: 16.29 — ABNORMAL HIGH (ref 0.26–1.65)
Lambda free light chains: 2.1 mg/L — ABNORMAL LOW (ref 5.7–26.3)

## 2020-09-25 ENCOUNTER — Telehealth: Payer: Self-pay | Admitting: Oncology

## 2020-09-25 NOTE — Telephone Encounter (Signed)
Scheduled per 1/26 los. Pt will receive an updated appt calendar per next visit appt notes  °

## 2020-09-27 ENCOUNTER — Other Ambulatory Visit: Payer: Self-pay | Admitting: Adult Health

## 2020-09-27 DIAGNOSIS — C9000 Multiple myeloma not having achieved remission: Secondary | ICD-10-CM

## 2020-09-27 NOTE — Progress Notes (Signed)
Evusheld referral placed per Dr. Jana Hakim

## 2020-09-29 ENCOUNTER — Other Ambulatory Visit (HOSPITAL_COMMUNITY): Payer: Self-pay | Admitting: Adult Health

## 2020-09-29 DIAGNOSIS — C9000 Multiple myeloma not having achieved remission: Secondary | ICD-10-CM

## 2020-09-29 NOTE — Progress Notes (Signed)
I connected by phone with Robin Wilson on 09/29/2020, 7:23 PM to discuss the potential use of a new treatment for mild to moderate COVID-19 viral infection in non-hospitalized patients.  This patient is a 60 y.o. female that meets the FDA criteria for Emergency Use Authorization of tixagevimab/cilgavimab for pre-exposure prophylaxis of COVID-19 disease. Pt meets following criteria:  Age >12 yr and weight > 40kg  Not currently infected with SARS-CoV-2 and has no known recent exposure to an individual infected with SARS-CoV-2 AND o Who has moderate to severe immune compromise due to a medical condition or receipt of immunosuppressive medications or treatments and may not mount an adequate immune response to COVID-19 vaccination or  o Vaccination with any available COVID-19 vaccine, according to the approved or authorized schedule, is not recommended due to a history of severe adverse reaction (e.g., severe allergic reaction) to a COVID-19 vaccine(s) and/or COVID-19 vaccine component(s).  o Patient meets the following definition of mod-severe immune compromised status: 4. Lung transplants, other solid organ transplant recipients on continual belatacept therapy, or multiple myeloma (actively receiving treatment)  I have spoken and communicated the following to the patient or parent/caregiver regarding COVID monoclonal antibody treatment:  1. FDA has authorized the emergency use of tixagevimab/cilgavimab for the pre-exposure prophylaxis of COVID-19 in patients with moderate-severe immunocompromised status, who meet above EUA criteria.  2. The significant known and potential risks and benefits of COVID monoclonal antibody, and the extent to which such potential risks and benefits are unknown.  3. Information on available alternative treatments and the risks and benefits of those alternatives, including clinical trials.  4. The patient or parent/caregiver has the option to accept or refuse COVID  monoclonal antibody treatment.  After reviewing this information with the patient, agree to receive tixagevimab/cilgavimab  Scot Dock, NP, 09/29/2020, 7:23 PM

## 2020-09-30 ENCOUNTER — Other Ambulatory Visit: Payer: BC Managed Care – PPO

## 2020-09-30 ENCOUNTER — Inpatient Hospital Stay: Payer: 59

## 2020-09-30 ENCOUNTER — Other Ambulatory Visit: Payer: Self-pay

## 2020-09-30 ENCOUNTER — Inpatient Hospital Stay: Payer: 59 | Attending: Oncology

## 2020-09-30 VITALS — BP 125/95 | HR 99 | Temp 98.6°F | Resp 18

## 2020-09-30 DIAGNOSIS — Z79899 Other long term (current) drug therapy: Secondary | ICD-10-CM | POA: Insufficient documentation

## 2020-09-30 DIAGNOSIS — C9000 Multiple myeloma not having achieved remission: Secondary | ICD-10-CM

## 2020-09-30 DIAGNOSIS — Z5112 Encounter for antineoplastic immunotherapy: Secondary | ICD-10-CM | POA: Insufficient documentation

## 2020-09-30 DIAGNOSIS — Z298 Encounter for other specified prophylactic measures: Secondary | ICD-10-CM | POA: Diagnosis not present

## 2020-09-30 LAB — CBC WITH DIFFERENTIAL (CANCER CENTER ONLY)
Abs Immature Granulocytes: 0.01 10*3/uL (ref 0.00–0.07)
Basophils Absolute: 0 10*3/uL (ref 0.0–0.1)
Basophils Relative: 1 %
Eosinophils Absolute: 0.2 10*3/uL (ref 0.0–0.5)
Eosinophils Relative: 4 %
HCT: 38.3 % (ref 36.0–46.0)
Hemoglobin: 12.9 g/dL (ref 12.0–15.0)
Immature Granulocytes: 0 %
Lymphocytes Relative: 26 %
Lymphs Abs: 1.4 10*3/uL (ref 0.7–4.0)
MCH: 34.3 pg — ABNORMAL HIGH (ref 26.0–34.0)
MCHC: 33.7 g/dL (ref 30.0–36.0)
MCV: 101.9 fL — ABNORMAL HIGH (ref 80.0–100.0)
Monocytes Absolute: 0.6 10*3/uL (ref 0.1–1.0)
Monocytes Relative: 11 %
Neutro Abs: 3.2 10*3/uL (ref 1.7–7.7)
Neutrophils Relative %: 58 %
Platelet Count: 253 10*3/uL (ref 150–400)
RBC: 3.76 MIL/uL — ABNORMAL LOW (ref 3.87–5.11)
RDW: 14.6 % (ref 11.5–15.5)
WBC Count: 5.5 10*3/uL (ref 4.0–10.5)
nRBC: 0 % (ref 0.0–0.2)

## 2020-09-30 LAB — COMPREHENSIVE METABOLIC PANEL
ALT: 27 U/L (ref 0–44)
AST: 17 U/L (ref 15–41)
Albumin: 3.8 g/dL (ref 3.5–5.0)
Alkaline Phosphatase: 83 U/L (ref 38–126)
Anion gap: 8 (ref 5–15)
BUN: 19 mg/dL (ref 6–20)
CO2: 29 mmol/L (ref 22–32)
Calcium: 8.9 mg/dL (ref 8.9–10.3)
Chloride: 104 mmol/L (ref 98–111)
Creatinine, Ser: 0.71 mg/dL (ref 0.44–1.00)
GFR, Estimated: >60 mL/min (ref >60)
Glucose, Bld: 85 mg/dL (ref 70–99)
Potassium: 3.7 mmol/L (ref 3.5–5.1)
Sodium: 141 mmol/L (ref 135–145)
Total Bilirubin: 0.4 mg/dL (ref 0.3–1.2)
Total Protein: 6.1 g/dL — ABNORMAL LOW (ref 6.5–8.1)

## 2020-09-30 MED ORDER — DEXAMETHASONE 4 MG PO TABS
20.0000 mg | ORAL_TABLET | Freq: Once | ORAL | Status: AC
  Administered 2020-09-30: 20 mg via ORAL

## 2020-09-30 MED ORDER — PROCHLORPERAZINE MALEATE 10 MG PO TABS
5.0000 mg | ORAL_TABLET | Freq: Once | ORAL | Status: AC
  Administered 2020-09-30: 5 mg via ORAL

## 2020-09-30 MED ORDER — DENOSUMAB 120 MG/1.7ML ~~LOC~~ SOLN
SUBCUTANEOUS | Status: AC
  Filled 2020-09-30: qty 1.7

## 2020-09-30 MED ORDER — BORTEZOMIB CHEMO SQ INJECTION 3.5 MG (2.5MG/ML)
1.3000 mg/m2 | Freq: Once | INTRAMUSCULAR | Status: AC
  Administered 2020-09-30: 2.25 mg via SUBCUTANEOUS
  Filled 2020-09-30: qty 0.9

## 2020-09-30 MED ORDER — DENOSUMAB 120 MG/1.7ML ~~LOC~~ SOLN
120.0000 mg | Freq: Once | SUBCUTANEOUS | Status: AC
  Administered 2020-09-30: 120 mg via SUBCUTANEOUS

## 2020-09-30 MED ORDER — PROCHLORPERAZINE MALEATE 10 MG PO TABS
ORAL_TABLET | ORAL | Status: AC
  Filled 2020-09-30: qty 1

## 2020-09-30 MED ORDER — DEXAMETHASONE 4 MG PO TABS
ORAL_TABLET | ORAL | Status: AC
  Filled 2020-09-30: qty 5

## 2020-09-30 NOTE — Patient Instructions (Signed)
[facility] Discharge Instructions for Patients Receiving Chemotherapy  Today you received the following chemotherapy agents: bortezomib  To help prevent nausea and vomiting after your treatment, we encourage you to take your nausea medication as directed.   If you develop nausea and vomiting that is not controlled by your nausea medication, call the clinic.   BELOW ARE SYMPTOMS THAT SHOULD BE REPORTED IMMEDIATELY:  *FEVER GREATER THAN 100.5 F  *CHILLS WITH OR WITHOUT FEVER  NAUSEA AND VOMITING THAT IS NOT CONTROLLED WITH YOUR NAUSEA MEDICATION  *UNUSUAL SHORTNESS OF BREATH  *UNUSUAL BRUISING OR BLEEDING  TENDERNESS IN MOUTH AND THROAT WITH OR WITHOUT PRESENCE OF ULCERS  *URINARY PROBLEMS  *BOWEL PROBLEMS  UNUSUAL RASH Items with * indicate a potential emergency and should be followed up as soon as possible.  Feel free to call the clinic should you have any questions or concerns. The clinic phone number is [phone].  Please show the [facility] at check-in to the Emergency Department and triage nurse.  Denosumab injection What is this medicine? DENOSUMAB (den oh sue mab) slows bone breakdown. Prolia is used to treat osteoporosis in women after menopause and in men, and in people who are taking corticosteroids for 6 months or more. Delton See is used to treat a high calcium level due to cancer and to prevent bone fractures and other bone problems caused by multiple myeloma or cancer bone metastases. Delton See is also used to treat giant cell tumor of the bone. This medicine may be used for other purposes; ask your health care provider or pharmacist if you have questions. COMMON BRAND NAME(S): Prolia, XGEVA What should I tell my health care provider before I take this medicine? They need to know if you have any of these conditions:  dental disease  having surgery or tooth extraction  infection  kidney disease  low levels of calcium or Vitamin D in the  blood  malnutrition  on hemodialysis  skin conditions or sensitivity  thyroid or parathyroid disease  an unusual reaction to denosumab, other medicines, foods, dyes, or preservatives  pregnant or trying to get pregnant  breast-feeding How should I use this medicine? This medicine is for injection under the skin. It is given by a health care professional in a hospital or clinic setting. A special MedGuide will be given to you before each treatment. Be sure to read this information carefully each time. For Prolia, talk to your pediatrician regarding the use of this medicine in children. Special care may be needed. For Delton See, talk to your pediatrician regarding the use of this medicine in children. While this drug may be prescribed for children as young as 13 years for selected conditions, precautions do apply. Overdosage: If you think you have taken too much of this medicine contact a poison control center or emergency room at once. NOTE: This medicine is only for you. Do not share this medicine with others. What if I miss a dose? It is important not to miss your dose. Call your doctor or health care professional if you are unable to keep an appointment. What may interact with this medicine? Do not take this medicine with any of the following medications:  other medicines containing denosumab This medicine may also interact with the following medications:  medicines that lower your chance of fighting infection  steroid medicines like prednisone or cortisone This list may not describe all possible interactions. Give your health care provider a list of all the medicines, herbs, non-prescription  drugs, or dietary supplements you use. Also tell them if you smoke, drink alcohol, or use illegal drugs. Some items may interact with your medicine. What should I watch for while using this medicine? Visit your doctor or health care professional for regular checks on your progress. Your doctor or  health care professional may order blood tests and other tests to see how you are doing. Call your doctor or health care professional for advice if you get a fever, chills or sore throat, or other symptoms of a cold or flu. Do not treat yourself. This drug may decrease your body's ability to fight infection. Try to avoid being around people who are sick. You should make sure you get enough calcium and vitamin D while you are taking this medicine, unless your doctor tells you not to. Discuss the foods you eat and the vitamins you take with your health care professional. See your dentist regularly. Brush and floss your teeth as directed. Before you have any dental work done, tell your dentist you are receiving this medicine. Do not become pregnant while taking this medicine or for 5 months after stopping it. Talk with your doctor or health care professional about your birth control options while taking this medicine. Women should inform their doctor if they wish to become pregnant or think they might be pregnant. There is a potential for serious side effects to an unborn child. Talk to your health care professional or pharmacist for more information. What side effects may I notice from receiving this medicine? Side effects that you should report to your doctor or health care professional as soon as possible:  allergic reactions like skin rash, itching or hives, swelling of the face, lips, or tongue  bone pain  breathing problems  dizziness  jaw pain, especially after dental work  redness, blistering, peeling of the skin  signs and symptoms of infection like fever or chills; cough; sore throat; pain or trouble passing urine  signs of low calcium like fast heartbeat, muscle cramps or muscle pain; pain, tingling, numbness in the hands or feet; seizures  unusual bleeding or bruising  unusually weak or tired Side effects that usually do not require medical attention (report to your doctor or  health care professional if they continue or are bothersome):  constipation  diarrhea  headache  joint pain  loss of appetite  muscle pain  runny nose  tiredness  upset stomach This list may not describe all possible side effects. Call your doctor for medical advice about side effects. You may report side effects to FDA at 1-800-FDA-1088. Where should I keep my medicine? This medicine is only given in a clinic, doctor's office, or other health care setting and will not be stored at home. NOTE: This sheet is a summary. It may not cover all possible information. If you have questions about this medicine, talk to your doctor, pharmacist, or health care provider.  2021 [employer]/Gold Standard (2017-12-22 16:10:44)

## 2020-10-07 ENCOUNTER — Other Ambulatory Visit: Payer: Self-pay | Admitting: Medical

## 2020-10-07 ENCOUNTER — Other Ambulatory Visit: Payer: Self-pay | Admitting: Adult Health

## 2020-10-07 ENCOUNTER — Inpatient Hospital Stay: Payer: 59

## 2020-10-07 ENCOUNTER — Other Ambulatory Visit: Payer: Self-pay

## 2020-10-07 VITALS — BP 151/94 | HR 97 | Temp 99.1°F | Resp 18

## 2020-10-07 DIAGNOSIS — C9000 Multiple myeloma not having achieved remission: Secondary | ICD-10-CM | POA: Diagnosis not present

## 2020-10-07 LAB — CBC WITH DIFFERENTIAL (CANCER CENTER ONLY)
Abs Immature Granulocytes: 0.01 10*3/uL (ref 0.00–0.07)
Basophils Absolute: 0 10*3/uL (ref 0.0–0.1)
Basophils Relative: 0 %
Eosinophils Absolute: 0.2 10*3/uL (ref 0.0–0.5)
Eosinophils Relative: 3 %
HCT: 36.6 % (ref 36.0–46.0)
Hemoglobin: 12.5 g/dL (ref 12.0–15.0)
Immature Granulocytes: 0 %
Lymphocytes Relative: 25 %
Lymphs Abs: 1.5 10*3/uL (ref 0.7–4.0)
MCH: 33.8 pg (ref 26.0–34.0)
MCHC: 34.2 g/dL (ref 30.0–36.0)
MCV: 98.9 fL (ref 80.0–100.0)
Monocytes Absolute: 0.6 10*3/uL (ref 0.1–1.0)
Monocytes Relative: 10 %
Neutro Abs: 3.7 10*3/uL (ref 1.7–7.7)
Neutrophils Relative %: 62 %
Platelet Count: 269 10*3/uL (ref 150–400)
RBC: 3.7 MIL/uL — ABNORMAL LOW (ref 3.87–5.11)
RDW: 14.4 % (ref 11.5–15.5)
WBC Count: 5.9 10*3/uL (ref 4.0–10.5)
nRBC: 0 % (ref 0.0–0.2)

## 2020-10-07 LAB — COMPREHENSIVE METABOLIC PANEL
ALT: 26 U/L (ref 0–44)
AST: 16 U/L (ref 15–41)
Albumin: 3.7 g/dL (ref 3.5–5.0)
Alkaline Phosphatase: 78 U/L (ref 38–126)
Anion gap: 7 (ref 5–15)
BUN: 23 mg/dL — ABNORMAL HIGH (ref 6–20)
CO2: 28 mmol/L (ref 22–32)
Calcium: 8.8 mg/dL — ABNORMAL LOW (ref 8.9–10.3)
Chloride: 106 mmol/L (ref 98–111)
Creatinine, Ser: 0.7 mg/dL (ref 0.44–1.00)
GFR, Estimated: >60 mL/min (ref >60)
Glucose, Bld: 86 mg/dL (ref 70–99)
Potassium: 3.9 mmol/L (ref 3.5–5.1)
Sodium: 141 mmol/L (ref 135–145)
Total Bilirubin: 0.4 mg/dL (ref 0.3–1.2)
Total Protein: 5.9 g/dL — ABNORMAL LOW (ref 6.5–8.1)

## 2020-10-07 MED ORDER — EPINEPHRINE 0.3 MG/0.3ML IJ SOAJ: 0.3000 mg | Freq: Once | INTRAMUSCULAR | Status: DC | PRN

## 2020-10-07 MED ORDER — CILGAVIMAB (PART OF EVUSHELD) INJECTION
150.0000 mg | Freq: Once | INTRAMUSCULAR | Status: DC
  Filled 2020-10-07: qty 1.5

## 2020-10-07 MED ORDER — BORTEZOMIB CHEMO SQ INJECTION 3.5 MG (2.5MG/ML)
1.3000 mg/m2 | Freq: Once | INTRAMUSCULAR | Status: AC
Start: 2020-10-07 — End: 2020-10-07
  Administered 2020-10-07: 2.25 mg via SUBCUTANEOUS
  Filled 2020-10-07: qty 0.9

## 2020-10-07 MED ORDER — DIPHENHYDRAMINE HCL 25 MG PO CAPS
25.0000 mg | ORAL_CAPSULE | Freq: Once | ORAL | Status: AC
  Administered 2020-10-07: 25 mg via ORAL

## 2020-10-07 MED ORDER — PROCHLORPERAZINE MALEATE 10 MG PO TABS
5.0000 mg | ORAL_TABLET | Freq: Once | ORAL | Status: AC
  Administered 2020-10-07: 5 mg via ORAL

## 2020-10-07 MED ORDER — TIXAGEVIMAB (PART OF EVUSHELD) INJECTION
150.0000 mg | Freq: Once | INTRAMUSCULAR | Status: AC
  Administered 2020-10-07: 150 mg via INTRAMUSCULAR
  Filled 2020-10-07: qty 1.5

## 2020-10-07 MED ORDER — TIXAGEVIMAB (PART OF EVUSHELD) INJECTION
150.0000 mg | Freq: Once | INTRAMUSCULAR | Status: DC
  Filled 2020-10-07: qty 1.5

## 2020-10-07 MED ORDER — EPINEPHRINE PF 1 MG/ML IJ SOLN: 0.3000 mg | Freq: Once | INTRAMUSCULAR | Status: AC | PRN

## 2020-10-07 MED ORDER — CILGAVIMAB (PART OF EVUSHELD) INJECTION
150.0000 mg | Freq: Once | INTRAMUSCULAR | Status: AC
  Administered 2020-10-07: 150 mg via INTRAMUSCULAR
  Filled 2020-10-07: qty 1.5

## 2020-10-07 MED ORDER — METHYLPREDNISOLONE SODIUM SUCC 125 MG IJ SOLR: 125.0000 mg | Freq: Once | INTRAMUSCULAR | Status: DC | PRN

## 2020-10-07 MED ORDER — ACETAMINOPHEN 325 MG PO TABS
650.0000 mg | ORAL_TABLET | Freq: Once | ORAL | Status: AC
  Administered 2020-10-07: 650 mg via ORAL

## 2020-10-07 MED ORDER — ACETAMINOPHEN 325 MG PO TABS
ORAL_TABLET | ORAL | Status: AC
  Filled 2020-10-07: qty 2

## 2020-10-07 MED ORDER — PROCHLORPERAZINE MALEATE 10 MG PO TABS
ORAL_TABLET | ORAL | Status: AC
  Filled 2020-10-07: qty 1

## 2020-10-07 MED ORDER — DEXAMETHASONE 4 MG PO TABS
20.0000 mg | ORAL_TABLET | Freq: Once | ORAL | Status: AC
  Administered 2020-10-07: 20 mg via ORAL

## 2020-10-07 MED ORDER — DARATUMUMAB-HYALURONIDASE-FIHJ 1800-30000 MG-UT/15ML ~~LOC~~ SOLN
1800.0000 mg | Freq: Once | SUBCUTANEOUS | Status: AC
  Administered 2020-10-07: 1800 mg via SUBCUTANEOUS
  Filled 2020-10-07: qty 15

## 2020-10-07 MED ORDER — DEXAMETHASONE 4 MG PO TABS
ORAL_TABLET | ORAL | Status: AC
  Filled 2020-10-07: qty 5

## 2020-10-07 MED ORDER — DIPHENHYDRAMINE HCL 50 MG/ML IJ SOLN: 50.0000 mg | Freq: Once | INTRAMUSCULAR | Status: DC | PRN

## 2020-10-07 MED ORDER — DIPHENHYDRAMINE HCL 25 MG PO CAPS
ORAL_CAPSULE | ORAL | Status: AC
  Filled 2020-10-07: qty 1

## 2020-10-07 MED ORDER — ALBUTEROL SULFATE HFA 108 (90 BASE) MCG/ACT IN AERS: 2.0000 | INHALATION_SPRAY | Freq: Once | RESPIRATORY_TRACT | Status: DC | PRN

## 2020-10-07 NOTE — Progress Notes (Signed)
Patient stayed 1 hour post observation evusheld.  Patient discharged in stable condition with no complaints.

## 2020-10-14 ENCOUNTER — Other Ambulatory Visit: Payer: Self-pay

## 2020-10-14 ENCOUNTER — Inpatient Hospital Stay: Payer: 59

## 2020-10-14 ENCOUNTER — Other Ambulatory Visit: Payer: Self-pay | Admitting: Pharmacist

## 2020-10-14 ENCOUNTER — Other Ambulatory Visit: Payer: Self-pay | Admitting: Oncology

## 2020-10-14 VITALS — BP 134/81 | HR 98 | Temp 99.3°F | Wt 140.2 lb

## 2020-10-14 DIAGNOSIS — C9000 Multiple myeloma not having achieved remission: Secondary | ICD-10-CM

## 2020-10-14 DIAGNOSIS — Z7189 Other specified counseling: Secondary | ICD-10-CM

## 2020-10-14 LAB — CBC WITH DIFFERENTIAL (CANCER CENTER ONLY)
Abs Immature Granulocytes: 0.02 10*3/uL (ref 0.00–0.07)
Basophils Absolute: 0 10*3/uL (ref 0.0–0.1)
Basophils Relative: 0 %
Eosinophils Absolute: 0.1 10*3/uL (ref 0.0–0.5)
Eosinophils Relative: 2 %
HCT: 39.8 % (ref 36.0–46.0)
Hemoglobin: 13.5 g/dL (ref 12.0–15.0)
Immature Granulocytes: 0 %
Lymphocytes Relative: 21 %
Lymphs Abs: 1.5 10*3/uL (ref 0.7–4.0)
MCH: 33.4 pg (ref 26.0–34.0)
MCHC: 33.9 g/dL (ref 30.0–36.0)
MCV: 98.5 fL (ref 80.0–100.0)
Monocytes Absolute: 0.7 10*3/uL (ref 0.1–1.0)
Monocytes Relative: 10 %
Neutro Abs: 4.7 10*3/uL (ref 1.7–7.7)
Neutrophils Relative %: 67 %
Platelet Count: 326 10*3/uL (ref 150–400)
RBC: 4.04 MIL/uL (ref 3.87–5.11)
RDW: 14.2 % (ref 11.5–15.5)
WBC Count: 7 10*3/uL (ref 4.0–10.5)
nRBC: 0 % (ref 0.0–0.2)

## 2020-10-14 LAB — COMPREHENSIVE METABOLIC PANEL
ALT: 24 U/L (ref 0–44)
AST: 16 U/L (ref 15–41)
Albumin: 3.9 g/dL (ref 3.5–5.0)
Alkaline Phosphatase: 77 U/L (ref 38–126)
Anion gap: 10 (ref 5–15)
BUN: 18 mg/dL (ref 6–20)
CO2: 31 mmol/L (ref 22–32)
Calcium: 9.8 mg/dL (ref 8.9–10.3)
Chloride: 101 mmol/L (ref 98–111)
Creatinine, Ser: 0.75 mg/dL (ref 0.44–1.00)
GFR, Estimated: >60 mL/min (ref >60)
Glucose, Bld: 108 mg/dL — ABNORMAL HIGH (ref 70–99)
Potassium: 3.2 mmol/L — ABNORMAL LOW (ref 3.5–5.1)
Sodium: 142 mmol/L (ref 135–145)
Total Bilirubin: 0.5 mg/dL (ref 0.3–1.2)
Total Protein: 6.4 g/dL — ABNORMAL LOW (ref 6.5–8.1)

## 2020-10-14 MED ORDER — DEXAMETHASONE 4 MG PO TABS
20.0000 mg | ORAL_TABLET | Freq: Once | ORAL | Status: AC
  Administered 2020-10-14: 20 mg via ORAL

## 2020-10-14 MED ORDER — PROCHLORPERAZINE MALEATE 10 MG PO TABS
ORAL_TABLET | ORAL | Status: AC
  Filled 2020-10-14: qty 1

## 2020-10-14 MED ORDER — PROCHLORPERAZINE MALEATE 10 MG PO TABS
5.0000 mg | ORAL_TABLET | Freq: Once | ORAL | Status: AC
  Administered 2020-10-14: 5 mg via ORAL

## 2020-10-14 MED ORDER — BORTEZOMIB CHEMO SQ INJECTION 3.5 MG (2.5MG/ML)
1.3000 mg/m2 | Freq: Once | INTRAMUSCULAR | Status: AC
Start: 2020-10-14 — End: 2020-10-14
  Administered 2020-10-14: 2.25 mg via SUBCUTANEOUS
  Filled 2020-10-14: qty 0.9

## 2020-10-14 MED ORDER — DEXAMETHASONE 4 MG PO TABS
ORAL_TABLET | ORAL | Status: AC
  Filled 2020-10-14: qty 5

## 2020-10-14 NOTE — Patient Instructions (Signed)
[facility] Discharge Instructions for Patients Receiving Chemotherapy  Today you received the following chemotherapy agents: bortezomib.  To help prevent nausea and vomiting after your treatment, we encourage you to take your nausea medication as directed.   If you develop nausea and vomiting that is not controlled by your nausea medication, call the clinic.   BELOW ARE SYMPTOMS THAT SHOULD BE REPORTED IMMEDIATELY:  *FEVER GREATER THAN 100.5 F  *CHILLS WITH OR WITHOUT FEVER  NAUSEA AND VOMITING THAT IS NOT CONTROLLED WITH YOUR NAUSEA MEDICATION  *UNUSUAL SHORTNESS OF BREATH  *UNUSUAL BRUISING OR BLEEDING  TENDERNESS IN MOUTH AND THROAT WITH OR WITHOUT PRESENCE OF ULCERS  *URINARY PROBLEMS  *BOWEL PROBLEMS  UNUSUAL RASH Items with * indicate a potential emergency and should be followed up as soon as possible.  Feel free to call the clinic should you have any questions or concerns. The clinic phone number is [phone].  Please show the CHEMO ALERT CARD at check-in to the Emergency Department and triage nurse.   

## 2020-10-15 LAB — KAPPA/LAMBDA LIGHT CHAINS
Kappa free light chain: 24.6 mg/L — ABNORMAL HIGH (ref 3.3–19.4)
Kappa, lambda light chain ratio: 11.18 — ABNORMAL HIGH (ref 0.26–1.65)
Lambda free light chains: 2.2 mg/L — ABNORMAL LOW (ref 5.7–26.3)

## 2020-10-19 LAB — PROTEIN ELECTROPHORESIS, SERUM
A/G Ratio: 1.8 — ABNORMAL HIGH (ref 0.7–1.7)
Albumin ELP: 4 g/dL (ref 2.9–4.4)
Alpha-1-Globulin: 0.2 g/dL (ref 0.0–0.4)
Alpha-2-Globulin: 0.6 g/dL (ref 0.4–1.0)
Beta Globulin: 1 g/dL (ref 0.7–1.3)
Gamma Globulin: 0.4 g/dL (ref 0.4–1.8)
Globulin, Total: 2.2 g/dL (ref 2.2–3.9)
M-Spike, %: 0.2 g/dL — ABNORMAL HIGH
Total Protein ELP: 6.2 g/dL (ref 6.0–8.5)

## 2020-10-21 ENCOUNTER — Inpatient Hospital Stay: Payer: 59

## 2020-10-21 ENCOUNTER — Other Ambulatory Visit: Payer: Self-pay

## 2020-10-21 VITALS — BP 144/96 | HR 100 | Temp 98.9°F | Resp 18 | Wt 141.1 lb

## 2020-10-21 DIAGNOSIS — C9000 Multiple myeloma not having achieved remission: Secondary | ICD-10-CM

## 2020-10-21 DIAGNOSIS — T451X5A Adverse effect of antineoplastic and immunosuppressive drugs, initial encounter: Secondary | ICD-10-CM

## 2020-10-21 DIAGNOSIS — G620 Drug-induced polyneuropathy: Secondary | ICD-10-CM

## 2020-10-21 DIAGNOSIS — Z7189 Other specified counseling: Secondary | ICD-10-CM

## 2020-10-21 LAB — CBC WITH DIFFERENTIAL (CANCER CENTER ONLY)
Abs Immature Granulocytes: 0.03 10*3/uL (ref 0.00–0.07)
Basophils Absolute: 0 10*3/uL (ref 0.0–0.1)
Basophils Relative: 0 %
Eosinophils Absolute: 0.1 10*3/uL (ref 0.0–0.5)
Eosinophils Relative: 2 %
HCT: 38.2 % (ref 36.0–46.0)
Hemoglobin: 12.9 g/dL (ref 12.0–15.0)
Immature Granulocytes: 1 %
Lymphocytes Relative: 28 %
Lymphs Abs: 1.6 10*3/uL (ref 0.7–4.0)
MCH: 33.3 pg (ref 26.0–34.0)
MCHC: 33.8 g/dL (ref 30.0–36.0)
MCV: 98.7 fL (ref 80.0–100.0)
Monocytes Absolute: 0.6 10*3/uL (ref 0.1–1.0)
Monocytes Relative: 10 %
Neutro Abs: 3.4 10*3/uL (ref 1.7–7.7)
Neutrophils Relative %: 59 %
Platelet Count: 310 10*3/uL (ref 150–400)
RBC: 3.87 MIL/uL (ref 3.87–5.11)
RDW: 14.1 % (ref 11.5–15.5)
WBC Count: 5.7 10*3/uL (ref 4.0–10.5)
nRBC: 0 % (ref 0.0–0.2)

## 2020-10-21 LAB — COMPREHENSIVE METABOLIC PANEL
ALT: 18 U/L (ref 0–44)
AST: 15 U/L (ref 15–41)
Albumin: 3.8 g/dL (ref 3.5–5.0)
Alkaline Phosphatase: 66 U/L (ref 38–126)
Anion gap: 9 (ref 5–15)
BUN: 14 mg/dL (ref 6–20)
CO2: 30 mmol/L (ref 22–32)
Calcium: 9 mg/dL (ref 8.9–10.3)
Chloride: 102 mmol/L (ref 98–111)
Creatinine, Ser: 0.72 mg/dL (ref 0.44–1.00)
GFR, Estimated: >60 mL/min (ref >60)
Glucose, Bld: 84 mg/dL (ref 70–99)
Potassium: 3.6 mmol/L (ref 3.5–5.1)
Sodium: 141 mmol/L (ref 135–145)
Total Bilirubin: 0.4 mg/dL (ref 0.3–1.2)
Total Protein: 6.1 g/dL — ABNORMAL LOW (ref 6.5–8.1)

## 2020-10-21 MED ORDER — PROCHLORPERAZINE MALEATE 10 MG PO TABS
ORAL_TABLET | ORAL | Status: AC
  Filled 2020-10-21: qty 1

## 2020-10-21 MED ORDER — DIPHENHYDRAMINE HCL 25 MG PO CAPS
ORAL_CAPSULE | ORAL | Status: AC
  Filled 2020-10-21: qty 1

## 2020-10-21 MED ORDER — DARATUMUMAB-HYALURONIDASE-FIHJ 1800-30000 MG-UT/15ML ~~LOC~~ SOLN
1800.0000 mg | Freq: Once | SUBCUTANEOUS | Status: AC
  Administered 2020-10-21: 1800 mg via SUBCUTANEOUS
  Filled 2020-10-21: qty 15

## 2020-10-21 MED ORDER — ACETAMINOPHEN 325 MG PO TABS
ORAL_TABLET | ORAL | Status: AC
  Filled 2020-10-21: qty 2

## 2020-10-21 MED ORDER — DEXAMETHASONE 4 MG PO TABS
20.0000 mg | ORAL_TABLET | Freq: Once | ORAL | Status: AC
  Administered 2020-10-21: 20 mg via ORAL

## 2020-10-21 MED ORDER — DIPHENHYDRAMINE HCL 25 MG PO CAPS
25.0000 mg | ORAL_CAPSULE | Freq: Once | ORAL | Status: AC
  Administered 2020-10-21: 25 mg via ORAL

## 2020-10-21 MED ORDER — DEXAMETHASONE 4 MG PO TABS
ORAL_TABLET | ORAL | Status: AC
  Filled 2020-10-21: qty 5

## 2020-10-21 MED ORDER — SODIUM CHLORIDE 0.9 % IV SOLN
Freq: Once | INTRAVENOUS | Status: DC
  Filled 2020-10-21: qty 250

## 2020-10-21 MED ORDER — PROCHLORPERAZINE MALEATE 10 MG PO TABS
5.0000 mg | ORAL_TABLET | Freq: Once | ORAL | Status: AC
  Administered 2020-10-21: 5 mg via ORAL

## 2020-10-21 MED ORDER — ACETAMINOPHEN 325 MG PO TABS
650.0000 mg | ORAL_TABLET | Freq: Once | ORAL | Status: AC
  Administered 2020-10-21: 650 mg via ORAL

## 2020-10-21 MED ORDER — BORTEZOMIB CHEMO SQ INJECTION 3.5 MG (2.5MG/ML)
1.3000 mg/m2 | Freq: Once | INTRAMUSCULAR | Status: AC
  Administered 2020-10-21: 2.25 mg via SUBCUTANEOUS
  Filled 2020-10-21: qty 0.9

## 2020-10-21 NOTE — Patient Instructions (Signed)
[facility] Discharge Instructions for Patients Receiving Chemotherapy  Today you received the following chemotherapy agents Bortezimib(Velcade), Daratumumab-Hydralaise(Darzalex faspro)  To help prevent nausea and vomiting after your treatment, we encourage you to take your nausea medication as directed.  If you develop nausea and vomiting that is not controlled by your nausea medication, call the clinic.   BELOW ARE SYMPTOMS THAT SHOULD BE REPORTED IMMEDIATELY:  *FEVER GREATER THAN 100.5 F  *CHILLS WITH OR WITHOUT FEVER  NAUSEA AND VOMITING THAT IS NOT CONTROLLED WITH YOUR NAUSEA MEDICATION  *UNUSUAL SHORTNESS OF BREATH  *UNUSUAL BRUISING OR BLEEDING  TENDERNESS IN MOUTH AND THROAT WITH OR WITHOUT PRESENCE OF ULCERS  *URINARY PROBLEMS  *BOWEL PROBLEMS  UNUSUAL RASH Items with * indicate a potential emergency and should be followed up as soon as possible.  Feel free to call the clinic should you have any questions or concerns. The clinic phone number is [phone].  Please show the [facility] at check-in to the Emergency Department and triage nurse.

## 2020-10-22 LAB — KAPPA/LAMBDA LIGHT CHAINS
Kappa free light chain: 23.6 mg/L — ABNORMAL HIGH (ref 3.3–19.4)
Kappa, lambda light chain ratio: 9.08 — ABNORMAL HIGH (ref 0.26–1.65)
Lambda free light chains: 2.6 mg/L — ABNORMAL LOW (ref 5.7–26.3)

## 2020-10-22 LAB — KAPPA/LAMBDA LIGHT CHAINS, FREE, WITH RATIO, 24HR. URINE
FR KAPPA LT CH,24HR: 3.56 mg/24 hr
FR LAMBDA LT CH,24HR: <0.89 mg/24 hr
Free Kappa Lt Chains,Ur: 2.69 mg/L (ref 0.63–113.79)
Free Kappa/Lambda Ratio: >4.01 (ref 1.03–31.76)
Free Lambda Lt Chains,Ur: <0.67 mg/L (ref 0.47–11.77)
Total Volume: 1325

## 2020-10-23 LAB — PROTEIN ELECTROPHORESIS, SERUM
A/G Ratio: 1.6 (ref 0.7–1.7)
Albumin ELP: 3.6 g/dL (ref 2.9–4.4)
Alpha-1-Globulin: 0.2 g/dL (ref 0.0–0.4)
Alpha-2-Globulin: 0.7 g/dL (ref 0.4–1.0)
Beta Globulin: 0.9 g/dL (ref 0.7–1.3)
Gamma Globulin: 0.4 g/dL (ref 0.4–1.8)
Globulin, Total: 2.3 g/dL (ref 2.2–3.9)
M-Spike, %: 0.2 g/dL — ABNORMAL HIGH
Total Protein ELP: 5.9 g/dL — ABNORMAL LOW (ref 6.0–8.5)

## 2020-10-26 ENCOUNTER — Other Ambulatory Visit: Payer: Self-pay | Admitting: Adult Health

## 2020-10-26 DIAGNOSIS — C9000 Multiple myeloma not having achieved remission: Secondary | ICD-10-CM

## 2020-10-26 NOTE — Progress Notes (Signed)
I reviewed with [name] that we received updated FDA guidance that all patients who previously received Tixagevimab150mg /Cilgavimab150mg  should receive another 150mg  dose to equal the new updated dose of 300mg Tixagevimab/300mg  Cilgavimab.  Robin Wilson agreed to receive this and will undergo this when she comes in for her next treatment on Wednesday, 10/28/2020.  Orders placed.    Wilber Bihari, NP

## 2020-10-27 NOTE — Progress Notes (Signed)
[facility]  [phone]) [phone] Fax:([phone]) [phone]    ID: Robin Wilson DOB: 01-Feb-1961  MR#: [id number]  IOE#:[id number]  Patient Care Team: Maurice Small, MD as PCP - General (Family Medicine) Rogina Schiano, Virgie Dad, MD as Consulting Physician (Oncology) Latanya Maudlin, MD as Consulting Physician (Orthopedic Surgery) Everlene Farrier, MD as Consulting Physician (Obstetrics and Gynecology) Domingo Pulse, MD (Urology) Ronald Lobo, MD as Consulting Physician (Gastroenterology) Markus Daft, MD as Consulting Physician (Interventional Radiology) Erline Levine, MD as Consulting Physician (Neurosurgery) Aris Lot Ramon Dredge, MD as Referring Physician (Hematology and Oncology) Milda Smart, MD as Referring Physician (Diagnostic Radiology) OTHER MD: Algis Liming. Lambird MD]  CHIEF COMPLAINT: Light chain myeloma  CURRENT TREATMENT: Bortezomib, dexamethasone; daratumumab; denosumab/Xgeva   INTERVAL HISTORY: Robin Wilson returns today for follow up and treatment of her myeloma accompanied by her husband [name].  She continues on bortezomib. Daratumumab was added on 07/08/2020 after she developed a rash from thalidomide and lenalidomide. She has tolerated daratumumab well so far, her only symptom being fatigue.  Her most recent dose was 10/21/2020.  This is currently repeated every 3 weeks.  We are following the free kappa light chains in the urine and the kappa/lambda ratio in the urine. Her most recent results from a 24-hour urine obtained 09/16/2020 shows the free kappa light chains in the urine down to 2.79 (from 9196 at baseline), with a ratio of 4.16  Lab Results  Component Value Date   KAPLAMBRATIO >4.01 10/21/2020   KAPLAMBRATIO 9.08 (H) 10/21/2020   KAPLAMBRATIO 11.18 (H) 10/14/2020   KAPLAMBRATIO 16.29 (H) 09/23/2020   KAPLAMBRATIO >4.16 09/16/2020   She continues on denosumab/Xgeva every 4 weeks, with her most recent dose on 09/30/2020.  She has a dose due  today.   REVIEW OF SYSTEMS: Robin Wilson has occasional episodes of mild vertigo.  She had one 3 days ago.  When that happens she feels a little nauseated, does not actually vomit, sits down, and it eventually clears.  She still has discomfort under the left breast and i at a similar level on her back.  Clinically this is improved with a little stretching or massage.  It is likely residual despite her kyphoplasty.   COVID 19 VACCINATION STATUS: Status post [place] x58, waiting on the booster; received Tixagevimab165m/Cilgavimab150mg x2, last dose 10/28/2020   HISTORY OF CURRENT ILLNESS: From the original intake note:  Robin EDICKis a 60y.o. woman that presented with low and thoracic back pain to Dr. RLatanya Maudlin She notes muscle spasms and a low grade fever of 100 degrees.   Labs completed on 02/19/2020 revealed a CBC that was within normal limits except for RBC 2.39, Hgb 8.8, HCT 26.3, MCV 110.0, MCH 36.8, RDW 17.7, AMC 91. CMP was within normal limits except for potassium 3.3, CO2 35. Uric Acid was within normal limits. Urine Protein was abnormal at 57. C-reactive protein was within normal limits. ANA screening was negative.   Thorasic Spine MRI on 02/22/2020 revealed diffuse marrow T1 hypointensity involving vertibral bodies and some spinous processes associated with mild STTR hyperintensity with mild wedge compression fracture of T7 and mild right superior endplate compression fracture of T11 that are suspicious for pathologic fractures. There is mild central zone narrowing at mid T7 with minimal-deformity of the cord. Differential considerationsinclude Lymphoma, multiple myeloma, and diffuse metastatic disease. There is partially visualized mild-moderate spinal canal stenosis and mild deformity of the cord at C5-6 and C6-7 probable moderate-severe to severe-neutral foraminal narrowing which would be better  assessed dedicated MRI of the cervical spine as clinically warranted.   Lab Results   Component Value Date   TOTALPROTELP 6.1 03/05/2020    Lab Results  Component Value Date   KPAFRELGTCHN 919.4 (H) 03/05/2020   LAMBDASER 7.0 03/05/2020   KAPLAMBRATIO 131.34 (H) 03/05/2020   Of note she had a CT of the abdomen on 01/03/2017 at [facility] which showed a well-defined sclerotic lesion in the left iliac bone consistent with a bone island and no lytic or sclerotic lesions anywhere else.   PAST MEDICAL HISTORY: Past Medical History:  Diagnosis Date  . Hypertension   . Kidney stones   . Kidney stones     PAST SURGICAL HISTORY: Past Surgical History:  Procedure Laterality Date  . CESAREAN SECTION    . CYSTOSCOPY WITH RETROGRADE PYELOGRAM, URETEROSCOPY AND STENT PLACEMENT Right 09/16/2015   Procedure: CYSTOSCOPY WITH RETROGRADE PYELOGRAM, URETEROSCOPY AND STENT PLACEMENT WITH HOLMIUM LASER ABLATION ;  Surgeon: Alexis Frock, MD;  Location: [facility];  Service: Urology;  Laterality: Right;  . CYSTOSCOPY/RETROGRADE/URETEROSCOPY  06/04/2012   Procedure: CYSTOSCOPY/RETROGRADE/URETEROSCOPY;  Surgeon: Ailene Rud, MD;  Location: [facility];  Service: Urology;  Laterality: Left;  . IR RADIOLOGIST EVAL & MGMT  04/29/2020  . UTERINE FIBROID EMBOLIZATION      FAMILY HISTORY: No family history on file. The patient has 1 brother, 2 sisters.  Both her parents died in their 21s.  There is no cancer history in the family to her knowledge   GYNECOLOGIC HISTORY:  No LMP recorded. Patient has had an ablation. 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  Her 58 sons are from her first marriage, [name] who is a Administrator, arts at [employer] who graduated from [employer] in Merchandiser, retail.  In 2016 she married her second husband, [name].  He is a Loss adjuster, chartered for  [employer].  He has 3 children of his own living in [place] and [place].    The patient attends [employer] DIRECTIVES: In the absence of any documents to the contrary the patient's husband is her healthcare power of attorney   HEALTH MAINTENANCE: Social History   Tobacco Use  . Smoking status: Never Smoker  . Smokeless tobacco: Never Used  Substance Use Topics  . Alcohol use: No  . Drug use: No    Colonoscopy: Due  PAP: Up-to-date  Bone density: Osteopenia Mammography: Up-to-date  Allergies  Allergen Reactions  . Sulfa Drugs Cross Reactors     From childhood per pt's mother , pt is unaware of allergy status    Current Outpatient Medications  Medication Sig Dispense Refill  . albuterol (PROVENTIL HFA;VENTOLIN HFA) 108 (90 Base) MCG/ACT inhaler Inhale 1-2 puffs into the lungs every 6 (six) hours as needed for wheezing or shortness of breath.    . Aspirin Buf,CaCarb-MgCarb-MgO, (BUFFERED ASPIRIN) 325 MG TABS Take 1 tablet by mouth daily. 30 tablet 12  . calcium carbonate (OS-CAL - DOSED IN MG OF ELEMENTAL CALCIUM) 1250 MG tablet Take 1 tablet by mouth daily.      Marland Kitchen dexamethasone (DECADRON) 4 MG tablet Take 5 tablets (20 mg total) by mouth once a week. Take the day after each daratumumab dose. 100 tablet 1  . hydrOXYzine (ATARAX/VISTARIL) 10 MG tablet Take  1 tablet (10 mg total) by mouth every 4 (four) hours as needed for anxiety. 30 tablet 0  . LORazepam (ATIVAN) 0.5 MG tablet Take 1 tablet (0.5 mg total) by mouth at bedtime as needed for anxiety. 60 tablet 0  . losartan-hydrochlorothiazide (HYZAAR) 100-25 MG per tablet Take 1 tablet by mouth daily.     . Multiple Vitamins-Minerals (MULTIVITAMIN PO) Take 1 tablet by mouth daily.     . potassium chloride (MICRO-K) 10 MEQ CR capsule Take 2 capsules (20 mEq total) by mouth 2 (two) times daily. 120 capsule 3  . prochlorperazine (COMPAZINE) 5 MG tablet Take 2 tablets (10 mg total) by mouth every 6 (six) hours as  needed for nausea or vomiting. 30 tablet 1  . sertraline (ZOLOFT) 50 MG tablet Take 75 mg by mouth daily.    Marland Kitchen tobramycin-dexamethasone (TOBRADEX) ophthalmic solution Place 1 drop into both eyes in the morning and at bedtime. 5 mL 0  . traZODone (DESYREL) 50 MG tablet Take 50 mg by mouth at bedtime.    . valACYclovir (VALTREX) 1000 MG tablet Take 1 tablet (1,000 mg total) by mouth 2 (two) times daily. (Patient taking differently: Take 1,000 mg by mouth daily. ) 30 tablet 6  . warfarin (COUMADIN) 5 MG tablet 5 mg daily on even number days 7.5 mg on odd number days 60 tablet 6   No current facility-administered medications for this visit.    OBJECTIVE: White woman who appears younger than stated age  33:   10/28/20 0830  BP: 131/82  Pulse: (!) 107  Resp: 18  Temp: (!) 97.5 F (36.4 C)  SpO2: 99%   Wt Readings from Last 3 Encounters:  10/28/20 141 lb 14.4 oz (64.4 kg)  10/21/20 141 lb 1.9 oz (64 kg)  10/14/20 140 lb 4 oz (63.6 kg)   Body mass index is 20.95 kg/m.    ECOG FS:1 - Symptomatic but completely ambulatory  Sclerae unicteric, EOMs intact Wearing a mask No cervical or supraclavicular adenopathy Lungs no rales or rhonchi Heart regular rate and rhythm Abd soft, nontender, positive bowel sounds MSK no focal spinal tenderness, no upper extremity lymphedema Neuro: nonfocal, well oriented, appropriate affect Breasts: Deferred   LAB RESULTS:  CMP     Component Value Date/Time   NA 142 10/28/2020 0816   K 3.3 (L) 10/28/2020 0816   CL 103 10/28/2020 0816   CO2 26 10/28/2020 0816   GLUCOSE 94 10/28/2020 0816   BUN 17 10/28/2020 0816   CREATININE 0.74 10/28/2020 0816   CALCIUM 8.8 (L) 10/28/2020 0816   PROT 6.4 (L) 10/28/2020 0816   ALBUMIN 3.9 10/28/2020 0816   AST 15 10/28/2020 0816   ALT 22 10/28/2020 0816   ALKPHOS 74 10/28/2020 0816   BILITOT 0.4 10/28/2020 0816   GFRNONAA >60 10/28/2020 0816   GFRAA >60 05/27/2020 1428    Lab Results  Component  Value Date   TOTALPROTELP 5.9 (L) 10/21/2020   ALBUMINELP 3.6 10/21/2020   A1GS 0.2 10/21/2020   A2GS 0.7 10/21/2020   BETS 0.9 10/21/2020   GAMS 0.4 10/21/2020   MSPIKE 0.2 (H) 10/21/2020   SPEI Comment 10/21/2020     Lab Results  Component Value Date   KPAFRELGTCHN 23.6 (H) 10/21/2020   LAMBDASER 2.6 (L) 10/21/2020   KAPLAMBRATIO >4.01 10/21/2020    Lab Results  Component Value Date   WBC 6.4 10/28/2020   NEUTROABS 4.0 10/28/2020   HGB 13.7 10/28/2020   HCT 39.7 10/28/2020   MCV  97.3 10/28/2020   PLT 296 10/28/2020    No results found for: LABCA2  No components found for: [id number]  No results for input(s): INR in the last 168 hours.  No results found for: LABCA2  No results found for: YQM578  No results found for: ION629  No results found for: BMW413  No results found for: CA2729  No components found for: HGQUANT  No results found for: CEA1 / No results found for: CEA1   No results found for: AFPTUMOR  No results found for: CHROMOGRNA  No results found for: HGBA, HGBA2QUANT, HGBFQUANT, HGBSQUAN (Hemoglobinopathy evaluation)   Lab Results  Component Value Date   LDH 240 (H) 04/08/2020    No results found for: IRON, TIBC, IRONPCTSAT (Iron and TIBC)  No results found for: FERRITIN  Urinalysis    Component Value Date/Time   COLORURINE YELLOW 09/15/2015 2315   APPEARANCEUR CLOUDY (A) 09/15/2015 2315   LABSPEC 1.014 09/15/2015 2315   PHURINE 8.5 (H) 09/15/2015 2315   GLUCOSEU NEGATIVE 09/15/2015 2315   HGBUR SMALL (A) 09/15/2015 2315   BILIRUBINUR NEGATIVE 09/15/2015 2315   KETONESUR 15 (A) 09/15/2015 2315   PROTEINUR NEGATIVE 09/15/2015 2315   UROBILINOGEN 1.0 05/24/2013 1913   NITRITE NEGATIVE 09/15/2015 2315   LEUKOCYTESUR TRACE (A) 09/15/2015 2315    STUDIES:  No results found.   ELIGIBLE FOR AVAILABLE RESEARCH PROTOCOL: no   ASSESSMENT: 60 y.o. [place] woman presenting June 2021 with severe back pain,  thoracic MRI 02/22/2020 suggestive of possible myeloma.  Work-up included:  (a) 03/05/2020 Myeloma panel shows no M protein but all immunoglobulins are decreased.  The lambda ratio is markedly abnormal at 131.34; review of the blood film is not diagnostic  (b) 24-hour urine: markedly elevated Kappa light chains at 9,656; urine kappa/lambda ratio 4180.24, and urine M spike of 239.  (c) bone survey on 03/16/2020 shows rounded lucencies throughout the skull which could be venous lakes  (d) bone marrow biopsy on 7/27 confirms multiple myeloma with 44% plasma cells on the aspirate by manual differential, and 70 to 80% on the biopsy by CD138 immunohistochemistry  (e) cytogenetics  03/24/2020: no metaphase cells available for analysis  (f) myeloma FISH studies 03/24/2020 show t(11,14), with NO del(17p), t(1/14) or t(14.16)  (g) on 04/08/2020 LDH 240, beta-2-microglobulin 4.4, albumin 4.2  (h) R-ISS Stage: II   (1) bortezomib/dexamethasone started 03/31/2020, most recent dose 10/28/2020  (a) lenalidomide added on 04/21/2020, stopped on 8/26 after two doses secondary to rash, resumed 04/29/2020 again with a very poor tolerance, treated with Medrol Dosepak, third attempt 05/14/2020, again unable to tolerate   (b) thalidomide added 06/04/2020 to bortezomib/ dexamethasone 05/27/2020, discontinued 06/24/2020 with rash  (c) daratumumab added 07/08/2020, most recent dose 10/21/2020   (2) denosumab/Xgeva started 05/06/2020, repeated every 28 days  (3) supportive therapy:  (a) valacyclovir 1g po BID  (b) ASA 325 mg daily  (c) Tixagevimab133m/Cilgavimab150mg x2, last dose 10/28/2020  (4) s/[facility] ([facility]) plan for high-dose therapy [facility] kappa light chains in the urine have plateaued.  She has had an excellent response and we reviewed all those numbers today.  [facility] (bone marrow biopsy, pulmonary function tests, heart test, PET scan, chest x-ray).  All that will be reviewed the following week and stem cell  collection will follow.  She believes that she will undergo high-dose treatment with autologous transplant before the end of this month.  At this point we are making her no further treatment appointments here.  Likely these will resume close transplant.  I will set her up for a virtual visit with me the first week in April.  Total encounter time 35 minutes.Sarajane Jews C. Antara Brecheisen, MD 10/28/20 9:01 AM Medical Oncology and Hematology [facility] [place], [place] [address] Tel. [phone]    Fax. [phone]   I, Wilburn Mylar, am acting as scribe for Dr. Virgie Dad. Townsend Cudworth.  I, Lurline Del MD, have reviewed the above documentation for accuracy and completeness, and I agree with the above.   *Total Encounter Time as defined by the Centers for Medicare and Medicaid Services includes, in addition to the face-to-face time of a patient visit (documented in the note above) non-face-to-face time: obtaining and reviewing outside history, ordering and reviewing medications, tests or procedures, care coordination (communications with other health care professionals or caregivers) and documentation in the medical record.

## 2020-10-28 ENCOUNTER — Other Ambulatory Visit: Payer: Self-pay

## 2020-10-28 ENCOUNTER — Inpatient Hospital Stay (HOSPITAL_BASED_OUTPATIENT_CLINIC_OR_DEPARTMENT_OTHER): Payer: 59 | Admitting: Oncology

## 2020-10-28 ENCOUNTER — Inpatient Hospital Stay: Payer: 59

## 2020-10-28 ENCOUNTER — Inpatient Hospital Stay: Payer: 59 | Attending: Oncology

## 2020-10-28 VITALS — HR 95

## 2020-10-28 VITALS — BP 131/82 | HR 107 | Temp 97.5°F | Resp 18 | Ht 69.0 in | Wt 141.9 lb

## 2020-10-28 DIAGNOSIS — Z79899 Other long term (current) drug therapy: Secondary | ICD-10-CM | POA: Diagnosis not present

## 2020-10-28 DIAGNOSIS — Z5112 Encounter for antineoplastic immunotherapy: Secondary | ICD-10-CM | POA: Insufficient documentation

## 2020-10-28 DIAGNOSIS — C9000 Multiple myeloma not having achieved remission: Secondary | ICD-10-CM

## 2020-10-28 DIAGNOSIS — Z298 Encounter for other specified prophylactic measures: Secondary | ICD-10-CM | POA: Insufficient documentation

## 2020-10-28 LAB — CBC WITH DIFFERENTIAL (CANCER CENTER ONLY)
Abs Immature Granulocytes: 0.02 10*3/uL (ref 0.00–0.07)
Basophils Absolute: 0 10*3/uL (ref 0.0–0.1)
Basophils Relative: 1 %
Eosinophils Absolute: 0.2 10*3/uL (ref 0.0–0.5)
Eosinophils Relative: 2 %
HCT: 39.7 % (ref 36.0–46.0)
Hemoglobin: 13.7 g/dL (ref 12.0–15.0)
Immature Granulocytes: 0 %
Lymphocytes Relative: 24 %
Lymphs Abs: 1.6 10*3/uL (ref 0.7–4.0)
MCH: 33.6 pg (ref 26.0–34.0)
MCHC: 34.5 g/dL (ref 30.0–36.0)
MCV: 97.3 fL (ref 80.0–100.0)
Monocytes Absolute: 0.6 10*3/uL (ref 0.1–1.0)
Monocytes Relative: 10 %
Neutro Abs: 4 10*3/uL (ref 1.7–7.7)
Neutrophils Relative %: 63 %
Platelet Count: 296 10*3/uL (ref 150–400)
RBC: 4.08 MIL/uL (ref 3.87–5.11)
RDW: 13.9 % (ref 11.5–15.5)
WBC Count: 6.4 10*3/uL (ref 4.0–10.5)
nRBC: 0 % (ref 0.0–0.2)

## 2020-10-28 LAB — COMPREHENSIVE METABOLIC PANEL
ALT: 22 U/L (ref 0–44)
AST: 15 U/L (ref 15–41)
Albumin: 3.9 g/dL (ref 3.5–5.0)
Alkaline Phosphatase: 74 U/L (ref 38–126)
Anion gap: 13 (ref 5–15)
BUN: 17 mg/dL (ref 6–20)
CO2: 26 mmol/L (ref 22–32)
Calcium: 8.8 mg/dL — ABNORMAL LOW (ref 8.9–10.3)
Chloride: 103 mmol/L (ref 98–111)
Creatinine, Ser: 0.74 mg/dL (ref 0.44–1.00)
GFR, Estimated: >60 mL/min (ref >60)
Glucose, Bld: 94 mg/dL (ref 70–99)
Potassium: 3.3 mmol/L — ABNORMAL LOW (ref 3.5–5.1)
Sodium: 142 mmol/L (ref 135–145)
Total Bilirubin: 0.4 mg/dL (ref 0.3–1.2)
Total Protein: 6.4 g/dL — ABNORMAL LOW (ref 6.5–8.1)

## 2020-10-28 MED ORDER — CILGAVIMAB (PART OF EVUSHELD) INJECTION
150.0000 mg | Freq: Once | INTRAMUSCULAR | Status: AC
  Administered 2020-10-28: 150 mg via INTRAMUSCULAR
  Filled 2020-10-28: qty 1.5

## 2020-10-28 MED ORDER — DEXAMETHASONE 4 MG PO TABS
ORAL_TABLET | ORAL | Status: AC
  Filled 2020-10-28: qty 5

## 2020-10-28 MED ORDER — TIXAGEVIMAB (PART OF EVUSHELD) INJECTION
150.0000 mg | Freq: Once | INTRAMUSCULAR | Status: AC
  Administered 2020-10-28: 150 mg via INTRAMUSCULAR
  Filled 2020-10-28: qty 1.5

## 2020-10-28 MED ORDER — PROCHLORPERAZINE MALEATE 10 MG PO TABS
ORAL_TABLET | ORAL | Status: AC
  Filled 2020-10-28: qty 1

## 2020-10-28 MED ORDER — DEXAMETHASONE 4 MG PO TABS
20.0000 mg | ORAL_TABLET | Freq: Once | ORAL | Status: AC
  Administered 2020-10-28: 20 mg via ORAL

## 2020-10-28 MED ORDER — BORTEZOMIB CHEMO SQ INJECTION 3.5 MG (2.5MG/ML)
1.3000 mg/m2 | Freq: Once | INTRAMUSCULAR | Status: AC
Start: 2020-10-28 — End: 2020-10-28
  Administered 2020-10-28: 2.25 mg via SUBCUTANEOUS
  Filled 2020-10-28: qty 0.9

## 2020-10-28 MED ORDER — PROCHLORPERAZINE MALEATE 10 MG PO TABS
5.0000 mg | ORAL_TABLET | Freq: Once | ORAL | Status: AC
  Administered 2020-10-28: 5 mg via ORAL

## 2020-10-28 NOTE — Patient Instructions (Signed)
[facility] Discharge Instructions for Patients Receiving Chemotherapy  Today you received the following chemotherapy agents: bortezomib.  To help prevent nausea and vomiting after your treatment, we encourage you to take your nausea medication as directed.   If you develop nausea and vomiting that is not controlled by your nausea medication, call the clinic.   BELOW ARE SYMPTOMS THAT SHOULD BE REPORTED IMMEDIATELY:  *FEVER GREATER THAN 100.5 F  *CHILLS WITH OR WITHOUT FEVER  NAUSEA AND VOMITING THAT IS NOT CONTROLLED WITH YOUR NAUSEA MEDICATION  *UNUSUAL SHORTNESS OF BREATH  *UNUSUAL BRUISING OR BLEEDING  TENDERNESS IN MOUTH AND THROAT WITH OR WITHOUT PRESENCE OF ULCERS  *URINARY PROBLEMS  *BOWEL PROBLEMS  UNUSUAL RASH Items with * indicate a potential emergency and should be followed up as soon as possible.  Feel free to call the clinic should you have any questions or concerns. The clinic phone number is [phone].  Please show the CHEMO ALERT CARD at check-in to the Emergency Department and triage nurse.   

## 2020-10-29 ENCOUNTER — Telehealth: Payer: Self-pay | Admitting: Oncology

## 2020-10-29 ENCOUNTER — Other Ambulatory Visit: Payer: Self-pay

## 2020-10-29 DIAGNOSIS — C9000 Multiple myeloma not having achieved remission: Secondary | ICD-10-CM | POA: Diagnosis not present

## 2020-10-29 DIAGNOSIS — Z01810 Encounter for preprocedural cardiovascular examination: Secondary | ICD-10-CM | POA: Diagnosis not present

## 2020-10-29 DIAGNOSIS — I517 Cardiomegaly: Secondary | ICD-10-CM | POA: Diagnosis not present

## 2020-10-29 DIAGNOSIS — R000 Tachycardia, unspecified: Secondary | ICD-10-CM | POA: Diagnosis not present

## 2020-10-29 DIAGNOSIS — Z01818 Encounter for other preprocedural examination: Secondary | ICD-10-CM | POA: Diagnosis not present

## 2020-10-29 NOTE — Telephone Encounter (Signed)
Scheduled appts per 3/2 los. Pt confirmed appt date and time

## 2020-11-19 DIAGNOSIS — Z9484 Stem cells transplant status: Secondary | ICD-10-CM | POA: Insufficient documentation

## 2020-11-25 NOTE — Progress Notes (Signed)
[place]  Telephone:([phone]) [phone] Fax:(336) [phone]    ID: Robin Wilson DOB: [birth year]  MR#: [id number]  ZDG#:[id number]  Patient Care Team: Maurice Small, MD as PCP - General (Family Medicine) Daray Polgar, Virgie Dad, MD as Consulting Physician (Oncology) Latanya Maudlin, MD as Consulting Physician (Orthopedic Surgery) Everlene Farrier, MD as Consulting Physician (Obstetrics and Gynecology) Domingo Pulse, MD (Urology) Ronald Lobo, MD as Consulting Physician (Gastroenterology) Markus Daft, MD as Consulting Physician (Interventional Radiology) Erline Levine, MD as Consulting Physician (Neurosurgery) Aris Lot Ramon Dredge, MD as Referring Physician (Hematology and Oncology) Milda Smart, MD as Referring Physician (Diagnostic Radiology) OTHER MD: Algis Liming. Lambird MD]  I connected with Robin Wilson on 11/25/20 at  2:00 PM EDT by telephone visit and verified that I am speaking with the correct person using two identifiers.   I discussed the limitations, risks, security and privacy concerns of performing an evaluation and management service by telemedicine and the availability of in-person appointments. I also discussed with the patient that there may be a patient responsible charge related to this service. The patient expressed understanding and agreed to proceed.   Other persons participating in the visit and their role in the encounter: None  Patient's location: [facility] clinic Provider's location: [facility]  Total time spent: 10 min   CHIEF COMPLAINT: Light chain myeloma  CURRENT TREATMENT: [Bortezomib, dexamethasone; daratumumab; denosumab/Xgeva]   INTERVAL HISTORY: Robin Wilson was contacted today for follow up of her myeloma.  She was referred to [facility] for high-dose therapy with autologous stem cell rescue. She underwent extensive workup on 10/29/2020 prior to treatment. PET scan showed: patchy sclerosis and small lytic lesions  associated with diffuse low-level FDG uptake; no focal plasmacytoma or evidence of extra medullary disease.  Echocardiogram showed an ejection fraction of 55-60%.  Bone marrow biopsy ([id number]) showed: hypercellular marrow for age with myeloid predominance and no increase in plasma cells.  We are following the free kappa light chains in the urine and the kappa/lambda ratio in the urine. Her most recent results from a 24-hour urine obtained 09/16/2020 shows the free kappa light chains in the urine down to 2.79 (from 9196 at baseline), with a ratio of 4.16  Lab Results  Component Value Date   KAPLAMBRATIO >4.01 10/21/2020   KAPLAMBRATIO 9.08 (H) 10/21/2020   KAPLAMBRATIO 11.18 (H) 10/14/2020   KAPLAMBRATIO 16.29 (H) 09/23/2020   KAPLAMBRATIO >4.16 09/16/2020   She had been on denosumab/Xgeva every 4 weeks, with her most recent dose on 09/30/2020.     REVIEW OF SYSTEMS: Robin Wilson tells me she had her stem cell reinfusion on 11/24/2020.  She says things were fine but today her counts are low and she is in the clinic getting fluids.  They are staying at [place] which is were patients generally stay post transplant until her counts recover.  She feels tired but otherwise well and currently she is having no mouth sores, no diarrhea, no rash, no fever.   COVID 19 VACCINATION STATUS: Status post [employer] x2, waiting on the booster   HISTORY OF CURRENT ILLNESS: From the original intake note:  Robin Wilson is a 60 y.o. woman that presented with low and thoracic back pain to Dr. Latanya Maudlin. She notes muscle spasms and a low grade fever of 100 degrees.   Labs completed on 02/19/2020 revealed a CBC that was within normal limits except for RBC 2.39, Hgb 8.8, HCT 26.3, MCV 110.0, MCH 36.8, RDW 17.7, AMC 91.  CMP was within normal limits except for potassium 3.3, CO2 35. Uric Acid was within normal limits. Urine Protein was abnormal at 57. C-reactive protein was within normal limits. ANA screening was  negative.   Thorasic Spine MRI on 02/22/2020 revealed diffuse marrow T1 hypointensity involving vertibral bodies and some spinous processes associated with mild STTR hyperintensity with mild wedge compression fracture of T7 and mild right superior endplate compression fracture of T11 that are suspicious for pathologic fractures. There is mild central zone narrowing at mid T7 with minimal-deformity of the cord. Differential considerationsinclude Lymphoma, multiple myeloma, and diffuse metastatic disease. There is partially visualized mild-moderate spinal canal stenosis and mild deformity of the cord at C5-6 and C6-7 probable moderate-severe to severe-neutral foraminal narrowing which would be better assessed dedicated MRI of the cervical spine as clinically warranted.   Lab Results  Component Value Date   TOTALPROTELP 6.1 03/05/2020    Lab Results  Component Value Date   KPAFRELGTCHN 919.4 (H) 03/05/2020   LAMBDASER 7.0 03/05/2020   KAPLAMBRATIO 131.34 (H) 03/05/2020   Of note she had a CT of the abdomen on 01/03/2017 at [facility] which showed a well-defined sclerotic lesion in the left iliac bone consistent with a bone island and no lytic or sclerotic lesions anywhere else.   PAST MEDICAL HISTORY: Past Medical History:  Diagnosis Date  . Hypertension   . Kidney stones   . Kidney stones     PAST SURGICAL HISTORY: Past Surgical History:  Procedure Laterality Date  . CESAREAN SECTION    . CYSTOSCOPY WITH RETROGRADE PYELOGRAM, URETEROSCOPY AND STENT PLACEMENT Right 09/16/2015   Procedure: CYSTOSCOPY WITH RETROGRADE PYELOGRAM, URETEROSCOPY AND STENT PLACEMENT WITH HOLMIUM LASER ABLATION ;  Surgeon: Alexis Frock, MD;  Location: [facility];  Service: Urology;  Laterality: Right;  . CYSTOSCOPY/RETROGRADE/URETEROSCOPY  06/04/2012   Procedure: CYSTOSCOPY/RETROGRADE/URETEROSCOPY;  Surgeon: Ailene Rud, MD;  Location: [facility];  Service: Urology;  Laterality: Left;  . IR RADIOLOGIST EVAL &  MGMT  04/29/2020  . UTERINE FIBROID EMBOLIZATION      FAMILY HISTORY: No family history on file. The patient has 1 brother, 2 sisters.  Both her parents died in their 85s.  There is no cancer history in the family to her knowledge   GYNECOLOGIC HISTORY:  No LMP recorded. Patient has had an ablation. 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  Her 2 sons are from her first marriage, [name] who is a Administrator, arts at [employer] who graduated from [employer] in Merchandiser, retail.  In 2016 she married her second husband, Robin Wilson.  He is a Loss adjuster, chartered for [employer].  He has 3 children of his own living in [place] and [place].    The patient attends [employer] DIRECTIVES: In the absence of any documents to the contrary the patient's husband is her healthcare power of attorney   HEALTH MAINTENANCE: Social History   Tobacco Use  . Smoking status: Never Smoker  . Smokeless tobacco: Never Used  Substance Use Topics  . Alcohol use: No  . Drug use: No    Colonoscopy: Due  PAP: Up-to-date  Bone density: Osteopenia Mammography: Up-to-date  Allergies  Allergen Reactions  . Ace Inhibitors Other (See Comments)  . Other Other (See Comments)  .  Sulfa Drugs Cross Reactors     From childhood per pt's mother , pt is unaware of allergy status    Current Outpatient Medications  Medication Sig Dispense Refill  . albuterol (PROVENTIL HFA;VENTOLIN HFA) 108 (90 Base) MCG/ACT inhaler Inhale 1-2 puffs into the lungs every 6 (six) hours as needed for wheezing or shortness of breath.    . Aspirin Buf,CaCarb-MgCarb-MgO, (BUFFERED ASPIRIN) 325 MG TABS Take 1 tablet by mouth daily. 30 tablet 12  . calcium carbonate (OS-CAL - DOSED IN MG OF ELEMENTAL CALCIUM)  1250 MG tablet Take 1 tablet by mouth daily.      . clobetasol cream (TEMOVATE) 0.05 %     . dexamethasone (DECADRON) 4 MG tablet Take 5 tablets (20 mg total) by mouth once a week. Take the day after each daratumumab dose. 100 tablet 1  . hydrOXYzine (ATARAX/VISTARIL) 10 MG tablet Take 1 tablet (10 mg total) by mouth every 4 (four) hours as needed for anxiety. 30 tablet 0  . LORazepam (ATIVAN) 0.5 MG tablet Take 1 tablet (0.5 mg total) by mouth at bedtime as needed for anxiety. 60 tablet 0  . losartan-hydrochlorothiazide (HYZAAR) 100-25 MG per tablet Take 1 tablet by mouth daily.     . Multiple Vitamins-Minerals (MULTIVITAMIN PO) Take 1 tablet by mouth daily.     . potassium chloride (MICRO-K) 10 MEQ CR capsule Take 2 capsules (20 mEq total) by mouth 2 (two) times daily. 120 capsule 3  . prochlorperazine (COMPAZINE) 5 MG tablet Take 2 tablets (10 mg total) by mouth every 6 (six) hours as needed for nausea or vomiting. 30 tablet 1  . sertraline (ZOLOFT) 50 MG tablet Take 75 mg by mouth daily.    . tamsulosin (FLOMAX) 0.4 MG CAPS capsule Take 1 capsule by mouth daily.    Marland Kitchen tobramycin-dexamethasone (TOBRADEX) ophthalmic solution Place 1 drop into both eyes in the morning and at bedtime. 5 mL 0  . traZODone (DESYREL) 50 MG tablet Take 50 mg by mouth at bedtime.    . valACYclovir (VALTREX) 1000 MG tablet Take 1 tablet (1,000 mg total) by mouth 2 (two) times daily. (Patient taking differently: Take 1,000 mg by mouth daily. ) 30 tablet 6   No current facility-administered medications for this visit.    OBJECTIVE:   There were no vitals filed for this visit. Wt Readings from Last 3 Encounters:  10/28/20 141 lb 14.4 oz (64.4 kg)  10/21/20 141 lb 1.9 oz (64 kg)  10/14/20 140 lb 4 oz (63.6 kg)   There is no height or weight on file to calculate BMI.    ECOG FS:2 - Symptomatic, <50% confined to bed  Telemedicine visit 11/26/2020   LAB RESULTS:  CMP     Component Value Date/Time   NA 142  10/28/2020 0816   K 3.3 (L) 10/28/2020 0816   CL 103 10/28/2020 0816   CO2 26 10/28/2020 0816   GLUCOSE 94 10/28/2020 0816   BUN 17 10/28/2020 0816   CREATININE 0.74 10/28/2020 0816   CALCIUM 8.8 (L) 10/28/2020 0816   PROT 6.4 (L) 10/28/2020 0816   ALBUMIN 3.9 10/28/2020 0816   AST 15 10/28/2020 0816   ALT 22 10/28/2020 0816   ALKPHOS 74 10/28/2020 0816   BILITOT 0.4 10/28/2020 0816   GFRNONAA >60 10/28/2020 0816   GFRAA >60 05/27/2020 1428    Lab Results  Component Value Date   TOTALPROTELP 5.9 (L) 10/21/2020   ALBUMINELP 3.6 10/21/2020   A1GS 0.2 10/21/2020   A2GS  0.7 10/21/2020   BETS 0.9 10/21/2020   GAMS 0.4 10/21/2020   MSPIKE 0.2 (H) 10/21/2020   SPEI Comment 10/21/2020     Lab Results  Component Value Date   KPAFRELGTCHN 23.6 (H) 10/21/2020   LAMBDASER 2.6 (L) 10/21/2020   KAPLAMBRATIO >4.01 10/21/2020    Lab Results  Component Value Date   WBC 6.4 10/28/2020   NEUTROABS 4.0 10/28/2020   HGB 13.7 10/28/2020   HCT 39.7 10/28/2020   MCV 97.3 10/28/2020   PLT 296 10/28/2020    No results found for: LABCA2  No components found for: GHWEXH371  No results for input(s): INR in the last 168 hours.  No results found for: LABCA2  No results found for: IRC789  No results found for: FYB017  No results found for: PZW258  No results found for: CA2729  No components found for: HGQUANT  No results found for: CEA1 / No results found for: CEA1   No results found for: AFPTUMOR  No results found for: CHROMOGRNA  No results found for: HGBA, HGBA2QUANT, HGBFQUANT, HGBSQUAN (Hemoglobinopathy evaluation)   Lab Results  Component Value Date   LDH 240 (H) 04/08/2020    No results found for: IRON, TIBC, IRONPCTSAT (Iron and TIBC)  No results found for: FERRITIN  Urinalysis    Component Value Date/Time   COLORURINE YELLOW 09/15/2015 2315   APPEARANCEUR CLOUDY (A) 09/15/2015 2315   LABSPEC 1.014 09/15/2015 2315   PHURINE 8.5 (H) 09/15/2015 2315    GLUCOSEU NEGATIVE 09/15/2015 2315   HGBUR SMALL (A) 09/15/2015 2315   BILIRUBINUR NEGATIVE 09/15/2015 2315   KETONESUR 15 (A) 09/15/2015 2315   PROTEINUR NEGATIVE 09/15/2015 2315   UROBILINOGEN 1.0 05/24/2013 1913   NITRITE NEGATIVE 09/15/2015 2315   LEUKOCYTESUR TRACE (A) 09/15/2015 2315    STUDIES:  No results found.   ELIGIBLE FOR AVAILABLE RESEARCH PROTOCOL: no   ASSESSMENT: 60 y.o. [place] woman presenting June 2021 with severe back pain, thoracic MRI 02/22/2020 suggestive of possible myeloma.  Work-up included:  (a) 03/05/2020 Myeloma panel shows no M protein but all immunoglobulins are decreased.  The lambda ratio is markedly abnormal at 131.34; review of the blood film is not diagnostic  (b) 24-hour urine: markedly elevated Kappa light chains at 9,656; urine kappa/lambda ratio 4180.24, and urine M spike of 239.  (c) bone survey on 03/16/2020 shows rounded lucencies throughout the skull which could be venous lakes  (d) bone marrow biopsy on 7/27 confirms multiple myeloma with 44% plasma cells on the aspirate by manual differential, and 70 to 80% on the biopsy by CD138 immunohistochemistry  (e) cytogenetics  03/24/2020: no metaphase cells available for analysis  (f) myeloma FISH studies 03/24/2020 show t(11,14), with NO del(17p), t(1/14) or t(14.16)  (g) on 04/08/2020 LDH 240, beta-2-microglobulin 4.4, albumin 4.2  (h) R-ISS Stage: II   (1) bortezomib/dexamethasone started 03/31/2020, most recent dose 10/28/2020  (a) lenalidomide added on 04/21/2020, stopped on 8/26 after two doses secondary to rash, resumed 04/29/2020 again with a very poor tolerance, treated with Medrol Dosepak, third attempt 05/14/2020, again unable to tolerate   (b) thalidomide added 06/04/2020 to bortezomib/ dexamethasone 05/27/2020, discontinued 06/24/2020 with rash  (c) daratumumab added 07/08/2020, most recent dose 10/21/2020   (2) denosumab/Xgeva started 05/06/2020, repeated every 28  days  (3) supportive therapy:  (a) valacyclovir 1g po BID  (b) ASA 325 mg daily  (c) Tixagevimab169m/Cilgavimab150mg x2, last dose 10/28/2020  (4) s/p kyphoplasty to T7, T11 and T12 [facility]  (5) plan for high-dose therapy with autologous stem cell rescue March 20[facility] at [facility]  (a) stem cell reinfusion 11/24/2020    PLAN: Robin Wilson is just post transplant, being followed closely at [facility], and currently entering neutropenia.  Hopefully she will make a quick marrow recovery and not develop any significant complications.  Tentatively I am putting her in for a repeat virtual visit in about 2 months.  She knows to call for any other issues that may develop before then.  Virgie Dad. Jacek Colson, MD 11/25/20 11:35 PM Medical Oncology and Hematology [facility] [place], [place] [address] Tel. [phone]    Fax. [phone]   I, Wilburn Mylar, am acting as scribe for Dr. Virgie Dad. Jago Carton.  I, Lurline Del MD, have reviewed the above documentation for accuracy and completeness, and I agree with the above.   *Total Encounter Time as defined by the Centers for Medicare and Medicaid Services includes, in addition to the face-to-face time of a patient visit (documented in the note above) non-face-to-face time: obtaining and reviewing outside history, ordering and reviewing medications, tests or procedures, care coordination (communications with other health care professionals or caregivers) and documentation in the medical record.

## 2020-11-26 ENCOUNTER — Inpatient Hospital Stay: Payer: 59 | Attending: Oncology | Admitting: Oncology

## 2020-11-26 DIAGNOSIS — R509 Fever, unspecified: Secondary | ICD-10-CM | POA: Diagnosis not present

## 2020-11-26 DIAGNOSIS — Z7982 Long term (current) use of aspirin: Secondary | ICD-10-CM | POA: Insufficient documentation

## 2020-11-26 DIAGNOSIS — C9000 Multiple myeloma not having achieved remission: Secondary | ICD-10-CM

## 2020-11-30 ENCOUNTER — Telehealth: Payer: Self-pay | Admitting: Oncology

## 2020-11-30 NOTE — Telephone Encounter (Signed)
Scheduled appt per 3/31 los. Called pt, no answer. Left msg with appt date and time.

## 2020-12-09 ENCOUNTER — Telehealth: Payer: Self-pay | Admitting: Oncology

## 2020-12-09 ENCOUNTER — Telehealth: Payer: Self-pay

## 2020-12-09 NOTE — Telephone Encounter (Signed)
Scheduled appts per 4/13 sch msg. Pt's husband is aware.

## 2020-12-09 NOTE — Telephone Encounter (Signed)
Called pt to schedule appt per 4/11 sch msg. Pt requested to speak to the nurse before scheduling any appts. I transferred her to the nurse and will hold off scheduling until pt speaks to her.

## 2020-12-09 NOTE — Telephone Encounter (Signed)
Message received from [place], [place] at [facility] regarding pt needing labs drawn from our facility 12/11/20 for post-transplant, as well as a f/u w/ Dr Jana Hakim on or around 01/23/21. Message sent to scheduling to accommodate these requests and pt is aware.

## 2020-12-11 ENCOUNTER — Inpatient Hospital Stay: Payer: 59

## 2020-12-11 ENCOUNTER — Telehealth: Payer: Self-pay | Admitting: Oncology

## 2020-12-11 NOTE — Telephone Encounter (Signed)
Cancelled appt per 4/15 sch msg. Spoke to pt's husband who said they would call back to r/s.

## 2020-12-24 ENCOUNTER — Other Ambulatory Visit: Payer: Self-pay | Admitting: Oncology

## 2021-01-18 NOTE — Progress Notes (Signed)
[place]  Telephone:([phone]) [phone] [phone]    ID: Robin Wilson DOB: 29-Dec-1960  MR#: [id number]  KDX#:[id number]  Patient Care Team: Maurice Small, MD as PCP - General (Family Medicine) Dacy Enrico, Virgie Dad, MD as Consulting Physician (Oncology) Latanya Maudlin, MD as Consulting Physician (Orthopedic Surgery) Everlene Farrier, MD as Consulting Physician (Obstetrics and Gynecology) Domingo Pulse, MD (Urology) Ronald Lobo, MD as Consulting Physician (Gastroenterology) Markus Daft, MD as Consulting Physician (Interventional Radiology) Erline Levine, MD as Consulting Physician (Neurosurgery) Aris Lot Ramon Dredge, MD as Referring Physician (Hematology and Oncology) Milda Smart, MD as Referring Physician (Diagnostic Radiology) OTHER MD: Algis Liming. Lambird MD]   CHIEF COMPLAINT: Light chain myeloma  CURRENT TREATMENT: [Bortezomib, dexamethasone; daratumumab; denosumab/Xgeva]   INTERVAL HISTORY: Robin Wilson returns today for follow up of her myeloma.  Since her last visit here she underwent autologous stem cell transplant at [facility], on 11/24/2020.  She did generally well, though her transplant course was complicated by culture-negative neutropenic fever requiring later readmission for FUO.  This is her day post 5 visit.  She is scheduled for restaging PET scan on 03/09/2021.  We are following the free kappa light chains in the urine and the kappa/lambda ratio in the urine.  Lab Results  Component Value Date   KAPLAMBRATIO >4.01 10/21/2020   KAPLAMBRATIO 9.08 (H) 10/21/2020   KAPLAMBRATIO 11.18 (H) 10/14/2020   KAPLAMBRATIO 16.29 (H) 09/23/2020   KAPLAMBRATIO >4.16 09/16/2020    REVIEW OF SYSTEMS: Robin Wilson is being extremely careful not to get exposed to [place].  This means she still feels very isolated.  They did take a trip to the beach, where they have a home, and it was just her and [name] so it was safe.  Her hair has not started to grow back yet.  She  does not have nausea at present but her appetite is not great.  Her taste is very variable.  She has no diarrhea problems or any problems with bowel movements and currently she has no rash or allergies.  She does take Zyrtec in the morning on hydroxy seen in the evening.  She feels fatigued.  She is trying to walk but not yet outside because of concerns regarding sun exposure.  A detailed review of systems was otherwise noncontributory    COVID 19 VACCINATION STATUS: Status post Coca-[employer]a x2, waiting on the booster   HISTORY OF CURRENT ILLNESS: From the original intake note:  Robin Wilson is a 60 y.o. woman that presented with low and thoracic back pain to Dr. Latanya Maudlin. She notes muscle spasms and a low grade fever of 100 degrees.   Labs completed on 02/19/2020 revealed a CBC that was within normal limits except for RBC 2.39, Hgb 8.8, HCT 26.3, MCV 110.0, MCH 36.8, RDW 17.7, AMC 91. CMP was within normal limits except for potassium 3.3, CO2 35. Uric Acid was within normal limits. Urine Protein was abnormal at 57. C-reactive protein was within normal limits. ANA screening was negative.   Thorasic Spine MRI on 02/22/2020 revealed diffuse marrow T1 hypointensity involving vertibral bodies and some spinous processes associated with mild STTR hyperintensity with mild wedge compression fracture of T7 and mild right superior endplate compression fracture of T11 that are suspicious for pathologic fractures. There is mild central zone narrowing at mid T7 with minimal-deformity of the cord. Differential considerationsinclude Lymphoma, multiple myeloma, and diffuse metastatic disease. There is partially visualized mild-moderate spinal canal stenosis and mild deformity of the cord at C5-6 and  C6-7 probable moderate-severe to severe-neutral foraminal narrowing which would be better assessed dedicated MRI of the cervical spine as clinically warranted.   Lab Results  Component Value Date   TOTALPROTELP 6.1  03/05/2020    Lab Results  Component Value Date   KPAFRELGTCHN 919.4 (H) 03/05/2020   LAMBDASER 7.0 03/05/2020   KAPLAMBRATIO 131.34 (H) 03/05/2020   Of note she had a CT of the abdomen on 01/03/2017 at [facility] which showed a well-defined sclerotic lesion in the left iliac bone consistent with a bone island and no lytic or sclerotic lesions anywhere else.   PAST MEDICAL HISTORY: Past Medical History:  Diagnosis Date  . Hypertension   . Kidney stones   . Kidney stones     PAST SURGICAL HISTORY: Past Surgical History:  Procedure Laterality Date  . CESAREAN SECTION    . CYSTOSCOPY WITH RETROGRADE PYELOGRAM, URETEROSCOPY AND STENT PLACEMENT Right 09/16/2015   Procedure: CYSTOSCOPY WITH RETROGRADE PYELOGRAM, URETEROSCOPY AND STENT PLACEMENT WITH HOLMIUM LASER ABLATION ;  Surgeon: Alexis Frock, MD;  Location: [facility];  Service: Urology;  Laterality: Right;  . CYSTOSCOPY/RETROGRADE/URETEROSCOPY  06/04/2012   Procedure: CYSTOSCOPY/RETROGRADE/URETEROSCOPY;  Surgeon: Ailene Rud, MD;  Location: [facility];  Service: Urology;  Laterality: Left;  . IR RADIOLOGIST EVAL & MGMT  04/29/2020  . UTERINE FIBROID EMBOLIZATION      FAMILY HISTORY: No family history on file. The patient has 1 brother, 2 sisters.  Both her parents died in their 61s.  There is no cancer history in the family to her knowledge   GYNECOLOGIC HISTORY:  No LMP recorded. Patient has had an ablation. 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  Her 2 sons are from her first marriage, [name] who is a Administrator, arts at [employer] who graduated from [employer] in Merchandiser, retail.  In 2016 she married her second husband, [name].  He is a Loss adjuster, chartered for [employer].  He has 3 children of his own living  in [place] and [place].    The patient attends [employer] DIRECTIVES: In the absence of any documents to the contrary the patient's husband is her healthcare power of attorney   HEALTH MAINTENANCE: Social History   Tobacco Use  . Smoking status: Never Smoker  . Smokeless tobacco: Never Used  Substance Use Topics  . Alcohol use: No  . Drug use: No    Colonoscopy: Due  PAP: Up-to-date  Bone density: Osteopenia Mammography: Up-to-date  Allergies  Allergen Reactions  . Ace Inhibitors Other (See Comments)  . Other Other (See Comments)  . Sulfa Drugs Cross Reactors     From childhood per pt's mother , pt is unaware of allergy status    Current Outpatient Medications  Medication Sig Dispense Refill  . predniSONE (DELTASONE) 5 MG tablet Take 6 tablets day 1, 5 tablets day 2, and so on until done; take with food 21 tablet 0  . albuterol (PROVENTIL HFA;VENTOLIN HFA) 108 (90 Base) MCG/ACT inhaler Inhale 1-2 puffs into the lungs every 6 (six) hours as needed for wheezing or shortness of breath.    . Aspirin Buf,CaCarb-MgCarb-MgO, (BUFFERED ASPIRIN) 325 MG TABS Take 1 tablet by mouth daily. 30 tablet 12  . calcium carbonate (OS-CAL - DOSED IN MG OF  ELEMENTAL CALCIUM) 1250 MG tablet Take 1 tablet by mouth daily.      . clobetasol cream (TEMOVATE) 0.05 %     . dexamethasone (DECADRON) 4 MG tablet Take 5 tablets (20 mg total) by mouth once a week. Take the day after each daratumumab dose. 100 tablet 1  . hydrOXYzine (ATARAX/VISTARIL) 10 MG tablet Take 1 tablet (10 mg total) by mouth every 4 (four) hours as needed for anxiety. 30 tablet 0  . LORazepam (ATIVAN) 0.5 MG tablet Take 1 tablet (0.5 mg total) by mouth at bedtime as needed for anxiety. 60 tablet 0  . losartan-hydrochlorothiazide (HYZAAR) 100-25 MG per tablet Take 1 tablet by mouth daily.     . Multiple Vitamins-Minerals (MULTIVITAMIN PO) Take 1 tablet by mouth daily.     . potassium chloride (MICRO-K) 10  MEQ CR capsule Take 2 capsules (20 mEq total) by mouth 2 (two) times daily. 120 capsule 3  . prochlorperazine (COMPAZINE) 5 MG tablet Take 2 tablets (10 mg total) by mouth every 6 (six) hours as needed for nausea or vomiting. 30 tablet 1  . sertraline (ZOLOFT) 50 MG tablet Take 75 mg by mouth daily.    . tamsulosin (FLOMAX) 0.4 MG CAPS capsule Take 1 capsule by mouth daily.    Marland Kitchen tobramycin-dexamethasone (TOBRADEX) ophthalmic solution Place 1 drop into both eyes in the morning and at bedtime. 5 mL 0  . traZODone (DESYREL) 50 MG tablet Take 50 mg by mouth at bedtime.    . valACYclovir (VALTREX) 1000 MG tablet Take 1 tablet (1,000 mg total) by mouth 2 (two) times daily. (Patient taking differently: Take 1,000 mg by mouth daily. ) 30 tablet 6   No current facility-administered medications for this visit.    OBJECTIVE: White woman who appears stated  Vitals:   01/19/21 1119  BP: 126/81  Pulse: 94  Resp: 18  Temp: 97.7 F (36.5 C)  SpO2: 93%   Wt Readings from Last 3 Encounters:  01/19/21 132 lb 4.8 oz (60 kg)  10/28/20 141 lb 14.4 oz (64.4 kg)  10/21/20 141 lb 1.9 oz (64 kg)   Body mass index is 19.54 kg/m.    ECOG FS:2 - Symptomatic, <50% confined to bed  Sclerae unicteric, EOMs intact No thrush or oral lesion No cervical or supraclavicular adenopathy Lungs no rales or rhonchi Heart regular rate and rhythm Abd soft, nontender, positive bowel sounds MSK no focal spinal tenderness, no upper extremity lymphedema Neuro: nonfocal, well oriented, appropriate affect Breasts: Deferred   LAB RESULTS:  CMP     Component Value Date/Time   NA 143 01/19/2021 1038   K 3.8 01/19/2021 1038   CL 108 01/19/2021 1038   CO2 27 01/19/2021 1038   GLUCOSE 100 (H) 01/19/2021 1038   BUN 13 01/19/2021 1038   CREATININE 0.66 01/19/2021 1038   CALCIUM 10.3 01/19/2021 1038   PROT 6.1 (L) 01/19/2021 1038   ALBUMIN 4.3 01/19/2021 1038   AST 21 01/19/2021 1038   ALT 13 01/19/2021 1038    ALKPHOS 59 01/19/2021 1038   BILITOT 1.0 01/19/2021 1038   GFRNONAA >60 01/19/2021 1038   GFRAA >60 05/27/2020 1428    Lab Results  Component Value Date   TOTALPROTELP 5.9 (L) 10/21/2020   ALBUMINELP 3.6 10/21/2020   A1GS 0.2 10/21/2020   A2GS 0.7 10/21/2020   BETS 0.9 10/21/2020   GAMS 0.4 10/21/2020   MSPIKE 0.2 (H) 10/21/2020   SPEI Comment 10/21/2020     Lab Results  Component  Value Date   KPAFRELGTCHN 23.6 (H) 10/21/2020   LAMBDASER 2.6 (L) 10/21/2020   KAPLAMBRATIO >4.01 10/21/2020    Lab Results  Component Value Date   WBC 6.6 01/19/2021   NEUTROABS 2.7 01/19/2021   HGB 10.8 (L) 01/19/2021   HCT 33.6 (L) 01/19/2021   MCV 109.8 (H) 01/19/2021   PLT 312 01/19/2021    No results found for: LABCA2  No components found for: UVOZDG644  No results for input(s): INR in the last 168 hours.  No results found for: LABCA2  No results found for: IHK742  No results found for: VZD638  No results found for: VFI433  No results found for: CA2729  No components found for: HGQUANT  No results found for: CEA1 / No results found for: CEA1   No results found for: AFPTUMOR  No results found for: CHROMOGRNA  No results found for: HGBA, HGBA2QUANT, HGBFQUANT, HGBSQUAN (Hemoglobinopathy evaluation)   Lab Results  Component Value Date   LDH 240 (H) 04/08/2020    No results found for: IRON, TIBC, IRONPCTSAT (Iron and TIBC)  No results found for: FERRITIN  Urinalysis    Component Value Date/Time   COLORURINE YELLOW 09/15/2015 2315   APPEARANCEUR CLOUDY (A) 09/15/2015 2315   LABSPEC 1.014 09/15/2015 2315   PHURINE 8.5 (H) 09/15/2015 2315   GLUCOSEU NEGATIVE 09/15/2015 2315   HGBUR SMALL (A) 09/15/2015 2315   BILIRUBINUR NEGATIVE 09/15/2015 2315   KETONESUR 15 (A) 09/15/2015 2315   PROTEINUR NEGATIVE 09/15/2015 2315   UROBILINOGEN 1.0 05/24/2013 1913   NITRITE NEGATIVE 09/15/2015 2315   LEUKOCYTESUR TRACE (A) 09/15/2015 2315    STUDIES:  No results  found.   ELIGIBLE FOR AVAILABLE RESEARCH PROTOCOL: no   ASSESSMENT: 60 y.o. [place] woman presenting June 2021 with severe back pain, thoracic MRI 02/22/2020 suggestive of possible myeloma.  Work-up included:  (a) 03/05/2020 Myeloma panel shows no M protein but all immunoglobulins are decreased.  The lambda ratio is markedly abnormal at 131.34; review of the blood film is not diagnostic  (b) 24-hour urine: markedly elevated Kappa light chains at 9,656; urine kappa/lambda ratio 4180.24, and urine M spike of 239.  (c) bone survey on 03/16/2020 shows rounded lucencies throughout the skull which could be venous lakes  (d) bone marrow biopsy on 7/27 confirms multiple myeloma with 44% plasma cells on the aspirate by manual differential, and 70 to 80% on the biopsy by CD138 immunohistochemistry  (e) cytogenetics  03/24/2020: no metaphase cells available for analysis  (f) myeloma FISH studies 03/24/2020 show t(11,14), with NO del(17p), t(1/14) or t(14.16)  (g) on 04/08/2020 LDH 240, beta-2-microglobulin 4.4, albumin 4.2  (h) R-ISS Stage: II   (1) bortezomib/dexamethasone started 03/31/2020, most recent dose 10/28/2020  (a) lenalidomide added on 04/21/2020, stopped on 8/26 after two doses secondary to rash, resumed 04/29/2020 again with a very poor tolerance, treated with Medrol Dosepak, third attempt 05/14/2020, again unable to tolerate   (b) thalidomide added 06/04/2020 to bortezomib/ dexamethasone 05/27/2020, discontinued 06/24/2020 with rash  (c) daratumumab added 07/08/2020, most recent dose 10/21/2020   (2) denosumab/Xgeva started 05/06/2020, repeated every 28 days  (3) supportive therapy:  (a) valacyclovir 1g po BID  (b) ASA 325 mg daily  (c) Tixagevimab172m/Cilgavimab150mg x2, last dose 10/28/2020  (4) [facility]) [facility] (a) stem  cell reinfusion 11/24/2020  (b) white cell engraftment  12/05/2020, platelet engraftment 12/11/2020    PLAN: Robin Wilson is day 58 out from her first autologous transplant.  She is making a good recovery and has had good engraftment.  She is taking her supportive medications without any significant side effects and she is taking appropriate precautions  Today we obtained a repeat myeloma panel including peripheral and urine light chains.  She will continue to be followed closely through [facility] where she already has further appointment and a PET scan planned.  Tentatively I am scheduling her to see me again in mid August, with repeat labs that day, but she will let me know if I need to see her before then  Total encounter time 25 minutes.Robin Jews C. Reymond Maynez, MD 01/19/21 6:54 PM Medical Oncology and Hematology [facility] [place], [place] [address] Tel. [phone]    Fax. [phone]   I, Wilburn Mylar, am acting as scribe for Dr. Virgie Dad. Danille Oppedisano.  I, Lurline Del MD, have reviewed the above documentation for accuracy and completeness, and I agree with the above.   *Total Encounter Time as defined by the Centers for Medicare and Medicaid Services includes, in addition to the face-to-face time of a patient visit (documented in the note above) non-face-to-face time: obtaining and reviewing outside history, ordering and reviewing medications, tests or procedures, care coordination (communications with other health care professionals or caregivers) and documentation in the medical record.

## 2021-01-19 ENCOUNTER — Inpatient Hospital Stay: Payer: 59 | Attending: Oncology

## 2021-01-19 ENCOUNTER — Inpatient Hospital Stay (HOSPITAL_BASED_OUTPATIENT_CLINIC_OR_DEPARTMENT_OTHER): Payer: 59 | Admitting: Oncology

## 2021-01-19 ENCOUNTER — Other Ambulatory Visit: Payer: Self-pay

## 2021-01-19 VITALS — BP 126/81 | HR 94 | Temp 97.7°F | Resp 18 | Ht 69.0 in | Wt 132.3 lb

## 2021-01-19 DIAGNOSIS — Z9484 Stem cells transplant status: Secondary | ICD-10-CM | POA: Diagnosis not present

## 2021-01-19 DIAGNOSIS — Z79899 Other long term (current) drug therapy: Secondary | ICD-10-CM | POA: Insufficient documentation

## 2021-01-19 DIAGNOSIS — C9000 Multiple myeloma not having achieved remission: Secondary | ICD-10-CM

## 2021-01-19 DIAGNOSIS — Z7189 Other specified counseling: Secondary | ICD-10-CM

## 2021-01-19 LAB — CBC WITH DIFFERENTIAL (CANCER CENTER ONLY)
Abs Immature Granulocytes: 0.02 10*3/uL (ref 0.00–0.07)
Basophils Absolute: 0.1 10*3/uL (ref 0.0–0.1)
Basophils Relative: 1 %
Eosinophils Absolute: 1.2 10*3/uL — ABNORMAL HIGH (ref 0.0–0.5)
Eosinophils Relative: 18 %
HCT: 33.6 % — ABNORMAL LOW (ref 36.0–46.0)
Hemoglobin: 10.8 g/dL — ABNORMAL LOW (ref 12.0–15.0)
Immature Granulocytes: 0 %
Lymphocytes Relative: 27 %
Lymphs Abs: 1.8 10*3/uL (ref 0.7–4.0)
MCH: 35.3 pg — ABNORMAL HIGH (ref 26.0–34.0)
MCHC: 32.1 g/dL (ref 30.0–36.0)
MCV: 109.8 fL — ABNORMAL HIGH (ref 80.0–100.0)
Monocytes Absolute: 0.8 10*3/uL (ref 0.1–1.0)
Monocytes Relative: 12 %
Neutro Abs: 2.7 10*3/uL (ref 1.7–7.7)
Neutrophils Relative %: 42 %
Platelet Count: 312 10*3/uL (ref 150–400)
RBC: 3.06 MIL/uL — ABNORMAL LOW (ref 3.87–5.11)
RDW: 16.1 % — ABNORMAL HIGH (ref 11.5–15.5)
WBC Count: 6.6 10*3/uL (ref 4.0–10.5)
nRBC: 0 % (ref 0.0–0.2)

## 2021-01-19 LAB — COMPREHENSIVE METABOLIC PANEL
ALT: 13 U/L (ref 0–44)
AST: 21 U/L (ref 15–41)
Albumin: 4.3 g/dL (ref 3.5–5.0)
Alkaline Phosphatase: 59 U/L (ref 38–126)
Anion gap: 8 (ref 5–15)
BUN: 13 mg/dL (ref 6–20)
CO2: 27 mmol/L (ref 22–32)
Calcium: 10.3 mg/dL (ref 8.9–10.3)
Chloride: 108 mmol/L (ref 98–111)
Creatinine, Ser: 0.66 mg/dL (ref 0.44–1.00)
GFR, Estimated: >60 mL/min (ref >60)
Glucose, Bld: 100 mg/dL — ABNORMAL HIGH (ref 70–99)
Potassium: 3.8 mmol/L (ref 3.5–5.1)
Sodium: 143 mmol/L (ref 135–145)
Total Bilirubin: 1 mg/dL (ref 0.3–1.2)
Total Protein: 6.1 g/dL — ABNORMAL LOW (ref 6.5–8.1)

## 2021-01-19 LAB — SAVE SMEAR(SSMR), FOR PROVIDER SLIDE REVIEW

## 2021-01-19 LAB — RETICULOCYTES
Immature Retic Fract: 24 % — ABNORMAL HIGH (ref 2.3–15.9)
RBC.: 3 MIL/uL — ABNORMAL LOW (ref 3.87–5.11)
Retic Count, Absolute: 166.8 10*3/uL (ref 19.0–186.0)
Retic Ct Pct: 5.6 % — ABNORMAL HIGH (ref 0.4–3.1)

## 2021-01-19 MED ORDER — PREDNISONE 5 MG PO TABS: ORAL_TABLET | ORAL | 0 refills | Status: DC

## 2021-01-20 ENCOUNTER — Telehealth: Payer: Self-pay | Admitting: Oncology

## 2021-01-20 LAB — KAPPA/LAMBDA LIGHT CHAINS
Kappa free light chain: 4 mg/L (ref 3.3–19.4)
Kappa, lambda light chain ratio: >2.67 — ABNORMAL HIGH (ref 0.26–1.65)
Lambda free light chains: <1.5 mg/L — ABNORMAL LOW (ref 5.7–26.3)

## 2021-01-20 NOTE — Telephone Encounter (Signed)
Scheduled appointment per 05/24 los. Patient is aware.

## 2021-01-22 LAB — MULTIPLE MYELOMA PANEL, SERUM
Albumin SerPl Elph-Mcnc: 3.8 g/dL (ref 2.9–4.4)
Albumin/Glob SerPl: 2.2 — ABNORMAL HIGH (ref 0.7–1.7)
Alpha 1: 0.2 g/dL (ref 0.0–0.4)
Alpha2 Glob SerPl Elph-Mcnc: 0.5 g/dL (ref 0.4–1.0)
B-Globulin SerPl Elph-Mcnc: 0.7 g/dL (ref 0.7–1.3)
Gamma Glob SerPl Elph-Mcnc: 0.3 g/dL — ABNORMAL LOW (ref 0.4–1.8)
Globulin, Total: 1.8 g/dL — ABNORMAL LOW (ref 2.2–3.9)
IgA: 5 mg/dL — ABNORMAL LOW (ref 87–352)
IgG (Immunoglobin G), Serum: 300 mg/dL — ABNORMAL LOW (ref 586–1602)
IgM (Immunoglobulin M), Srm: 6 mg/dL — ABNORMAL LOW (ref 26–217)
M Protein SerPl Elph-Mcnc: 0.1 g/dL — ABNORMAL HIGH
Total Protein ELP: 5.6 g/dL — ABNORMAL LOW (ref 6.0–8.5)

## 2021-01-27 ENCOUNTER — Inpatient Hospital Stay: Payer: 59 | Attending: Oncology | Admitting: Oncology

## 2021-01-27 ENCOUNTER — Telehealth: Payer: Self-pay | Admitting: Oncology

## 2021-01-27 DIAGNOSIS — C9000 Multiple myeloma not having achieved remission: Secondary | ICD-10-CM

## 2021-01-27 NOTE — Progress Notes (Signed)
[facility]  Telephone:([phone]) [phone] Fax:(336) [phone]    ID: Robin Wilson DOB: Jun 02, 1961  MR#: [id number]  QPY#:[id number]  Patient Care Team: Robin Small, Wilson as PCP - General (Family Medicine) Robin Wilson, Robin Dad, Wilson as Consulting Physician (Oncology) Robin Maudlin, Wilson as Consulting Physician (Orthopedic Surgery) Robin Farrier, Wilson as Consulting Physician (Obstetrics and Gynecology) Robin Pulse, Wilson (Urology) Robin Lobo, Wilson as Consulting Physician (Gastroenterology) Robin Daft, Wilson as Consulting Physician (Interventional Radiology) Robin Levine, Wilson as Consulting Physician (Neurosurgery) Robin Lot Ramon Dredge, Wilson as Referring Physician (Hematology and Oncology) Robin Smart, Wilson as Referring Physician (Diagnostic Radiology) OTHER Wilson: Robin Wilson. Robin Wilson]  I connected with Robin Wilson on 01/27/21 at 11:30 AM EDT by telephone visit and verified that I am speaking with the correct person using two identifiers.   I discussed the limitations, risks, security and privacy concerns of performing an evaluation and management service by telemedicine and the availability of in-person appointments. I also discussed with the patient that there may be a patient responsible charge related to this service. The patient expressed understanding and agreed to proceed.   Other persons participating in the visit and their role in the encounter: None  Patient's location: Home Provider's location: [facility] [place]  Total time spent: 15 min   CHIEF COMPLAINT: Light chain myeloma  CURRENT TREATMENT: Observation  INTERVAL HISTORY: Robin Wilson was contacted today for follow up of her myeloma.  Clinically she continues to improve, and is planning to get back to work in about 2 weeks.  She is scheduled for restaging PET scan on 03/09/2021.  We are following the free kappa light chains in the urine and the kappa/lambda ratio in the urine.  Lab Results  Component  Value Date   KAPLAMBRATIO >2.67 (H) 01/19/2021   KAPLAMBRATIO >4.01 10/21/2020   KAPLAMBRATIO 9.08 (H) 10/21/2020   KAPLAMBRATIO 11.18 (H) 10/14/2020   KAPLAMBRATIO 16.29 (H) 09/23/2020    REVIEW OF SYSTEMS: [name] denies unusual headaches, visual changes, nausea, vomiting, gait imbalance or falls, cough phlegm production or pleurisy, fever, rash, bleeding, unexpected fatigue or unexplained weight loss.   COVID 19 VACCINATION STATUS: Status post [employer] x2, waiting on the booster   HISTORY OF CURRENT ILLNESS: From the original intake note:  Robin Wilson is a 60 y.o. Wilson that presented with low and thoracic back pain to Dr. Latanya Wilson. She notes muscle spasms and a low grade fever of 100 degrees.   Labs completed on 02/19/2020 revealed a CBC that was within normal limits except for RBC 2.39, Hgb 8.8, HCT 26.3, MCV 110.0, MCH 36.8, RDW 17.7, AMC 91. CMP was within normal limits except for potassium 3.3, CO2 35. Uric Acid was within normal limits. Urine Protein was abnormal at 57. C-reactive protein was within normal limits. ANA screening was negative.   Thorasic Spine MRI on 02/22/2020 revealed diffuse marrow T1 hypointensity involving vertibral bodies and some spinous processes associated with mild STTR hyperintensity with mild wedge compression fracture of T7 and mild right superior endplate compression fracture of T11 that are suspicious for pathologic fractures. There is mild central zone narrowing at mid T7 with minimal-deformity of the cord. Differential considerationsinclude Lymphoma, multiple myeloma, and diffuse metastatic disease. There is partially visualized mild-moderate spinal canal stenosis and mild deformity of the cord at C5-6 and C6-7 probable moderate-severe to severe-neutral foraminal narrowing which would be better assessed dedicated MRI of the cervical spine as clinically warranted.   Lab Results  Component Value  Date   TOTALPROTELP 6.1 03/05/2020    Lab Results   Component Value Date   KPAFRELGTCHN 919.4 (H) 03/05/2020   LAMBDASER 7.0 03/05/2020   KAPLAMBRATIO 131.34 (H) 03/05/2020   Of note she had a CT of the abdomen on 01/03/2017 at [facility] which showed a well-defined sclerotic lesion in the left iliac bone consistent with a bone island and no lytic or sclerotic lesions anywhere else.   PAST MEDICAL HISTORY: Past Medical History:  Diagnosis Date  . Hypertension   . Kidney stones   . Kidney stones     PAST SURGICAL HISTORY: Past Surgical History:  Procedure Laterality Date  . CESAREAN SECTION    . CYSTOSCOPY WITH RETROGRADE PYELOGRAM, URETEROSCOPY AND STENT PLACEMENT Right 09/16/2015   Procedure: CYSTOSCOPY WITH RETROGRADE PYELOGRAM, URETEROSCOPY AND STENT PLACEMENT WITH HOLMIUM LASER ABLATION ;  Surgeon: Robin Frock, Wilson;  Location: [facility];  Service: Urology;  Laterality: Right;  . CYSTOSCOPY/RETROGRADE/URETEROSCOPY  06/04/2012   Procedure: CYSTOSCOPY/RETROGRADE/URETEROSCOPY;  Surgeon: Robin Rud, Wilson;  Location: [facility];  Service: Urology;  Laterality: Left;  . IR RADIOLOGIST EVAL & MGMT  04/29/2020  . UTERINE FIBROID EMBOLIZATION      FAMILY HISTORY: No family history on file. The patient has 1 brother, 2 sisters.  Both her parents died in their 9s.  There is no cancer history in the family to her knowledge   GYNECOLOGIC HISTORY:  No LMP recorded. Patient has had an ablation. 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  Her 2 sons are from her first marriage, Robin Wilson who is a Administrator, arts at [employer] who graduated from [employer] in Merchandiser, retail.  In 2016 she married her second husband, Robin Wilson.  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  Robin Wilson has 3 children of his own living in [place] and  [place].    The patient attends Robin Wilson DIRECTIVES: In the absence of any documents to the contrary the patient's husband is her healthcare power of attorney   HEALTH MAINTENANCE: Social History   Tobacco Use  . Smoking status: Never Smoker  . Smokeless tobacco: Never Used  Substance Use Topics  . Alcohol use: No  . Drug use: No    Colonoscopy: Due  PAP: Up-to-date  Bone density: Osteopenia Mammography: Up-to-date  Allergies  Allergen Reactions  . Ace Inhibitors Other (See Comments)  . Other Other (See Comments)  . Sulfa Drugs Cross Reactors     From childhood per pt's mother , pt is unaware of allergy status    Current Outpatient Medications  Medication Sig Dispense Refill  . albuterol (PROVENTIL HFA;VENTOLIN HFA) 108 (90 Base) MCG/ACT inhaler Inhale 1-2 puffs into the lungs every 6 (six) hours as needed for wheezing or shortness of breath.    . Aspirin Buf,CaCarb-MgCarb-MgO, (BUFFERED ASPIRIN) 325 MG TABS Take 1 tablet by mouth daily. 30 tablet 12  . calcium carbonate (OS-CAL - DOSED IN MG OF ELEMENTAL CALCIUM) 1250 MG tablet Take 1 tablet by mouth daily.      . clobetasol cream (TEMOVATE) 0.05 %     . dexamethasone (DECADRON) 4 MG tablet Take 5 tablets (20 mg total) by mouth once a week. Take the day after each daratumumab dose. 100 tablet 1  .  hydrOXYzine (ATARAX/VISTARIL) 10 MG tablet Take 1 tablet (10 mg total) by mouth every 4 (four) hours as needed for anxiety. 30 tablet 0  . LORazepam (ATIVAN) 0.5 MG tablet Take 1 tablet (0.5 mg total) by mouth at bedtime as needed for anxiety. 60 tablet 0  . losartan-hydrochlorothiazide (HYZAAR) 100-25 MG per tablet Take 1 tablet by mouth daily.     . Multiple Vitamins-Minerals (MULTIVITAMIN PO) Take 1 tablet by mouth daily.     . potassium chloride (MICRO-K) 10 MEQ CR capsule Take 2 capsules (20 mEq total) by mouth 2 (two) times daily. 120 capsule 3  . predniSONE (DELTASONE) 5 MG tablet Take 6 tablets day 1, 5  tablets day 2, and so on until done; take with food 21 tablet 0  . prochlorperazine (COMPAZINE) 5 MG tablet Take 2 tablets (10 mg total) by mouth every 6 (six) hours as needed for nausea or vomiting. 30 tablet 1  . sertraline (ZOLOFT) 50 MG tablet Take 75 mg by mouth daily.    . tamsulosin (FLOMAX) 0.4 MG CAPS capsule Take 1 capsule by mouth daily.    Marland Kitchen tobramycin-dexamethasone (TOBRADEX) ophthalmic solution Place 1 drop into both eyes in the morning and at bedtime. 5 mL 0  . traZODone (DESYREL) 50 MG tablet Take 50 mg by mouth at bedtime.    . valACYclovir (VALTREX) 1000 MG tablet Take 1 tablet (1,000 mg total) by mouth 2 (two) times daily. (Patient taking differently: Take 1,000 mg by mouth daily. ) 30 tablet 6   No current facility-administered medications for this visit.    OBJECTIVE:   There were no vitals filed for this visit. Wt Readings from Last 3 Encounters:  01/19/21 132 lb 4.8 oz (60 kg)  10/28/20 141 lb 14.4 oz (64.4 kg)  10/21/20 141 lb 1.9 oz (64 kg)   There is no height or weight on file to calculate BMI.    ECOG FS:2 - Symptomatic, <50% confined to bed  Telemedicine visit 01/27/2021   LAB RESULTS:  CMP     Component Value Date/Time   NA 143 01/19/2021 1038   K 3.8 01/19/2021 1038   CL 108 01/19/2021 1038   CO2 27 01/19/2021 1038   GLUCOSE 100 (H) 01/19/2021 1038   BUN 13 01/19/2021 1038   CREATININE 0.66 01/19/2021 1038   CALCIUM 10.3 01/19/2021 1038   PROT 6.1 (L) 01/19/2021 1038   ALBUMIN 4.3 01/19/2021 1038   AST 21 01/19/2021 1038   ALT 13 01/19/2021 1038   ALKPHOS 59 01/19/2021 1038   BILITOT 1.0 01/19/2021 1038   GFRNONAA >60 01/19/2021 1038   GFRAA >60 05/27/2020 1428    Lab Results  Component Value Date   TOTALPROTELP 5.6 (L) 01/19/2021   ALBUMINELP 3.6 10/21/2020   A1GS 0.2 10/21/2020   A2GS 0.7 10/21/2020   BETS 0.9 10/21/2020   GAMS 0.4 10/21/2020   MSPIKE 0.2 (H) 10/21/2020   SPEI Comment 10/21/2020     Lab Results  Component  Value Date   KPAFRELGTCHN 4.0 01/19/2021   LAMBDASER <1.5 (L) 01/19/2021   KAPLAMBRATIO >2.67 (H) 01/19/2021    Lab Results  Component Value Date   WBC 6.6 01/19/2021   NEUTROABS 2.7 01/19/2021   HGB 10.8 (L) 01/19/2021   HCT 33.6 (L) 01/19/2021   MCV 109.8 (H) 01/19/2021   PLT 312 01/19/2021    No results found for: LABCA2  No components found for: RSWNIO270  No results for input(s): INR in the last 168 hours.  No  results found for: LABCA2  No results found for: TSV779  No results found for: TJQ300  No results found for: PQZ300  No results found for: CA2729  No components found for: HGQUANT  No results found for: CEA1 / No results found for: CEA1   No results found for: AFPTUMOR  No results found for: CHROMOGRNA  No results found for: HGBA, HGBA2QUANT, HGBFQUANT, HGBSQUAN (Hemoglobinopathy evaluation)   Lab Results  Component Value Date   LDH 240 (H) 04/08/2020    No results found for: IRON, TIBC, IRONPCTSAT (Iron and TIBC)  No results found for: FERRITIN  Urinalysis    Component Value Date/Time   COLORURINE YELLOW 09/15/2015 2315   APPEARANCEUR CLOUDY (A) 09/15/2015 2315   LABSPEC 1.014 09/15/2015 2315   PHURINE 8.5 (H) 09/15/2015 2315   GLUCOSEU NEGATIVE 09/15/2015 2315   HGBUR Wilson (A) 09/15/2015 2315   BILIRUBINUR NEGATIVE 09/15/2015 2315   KETONESUR 15 (A) 09/15/2015 2315   PROTEINUR NEGATIVE 09/15/2015 2315   UROBILINOGEN 1.0 05/24/2013 1913   NITRITE NEGATIVE 09/15/2015 2315   LEUKOCYTESUR TRACE (A) 09/15/2015 2315    STUDIES:  No results found.   ELIGIBLE FOR AVAILABLE RESEARCH PROTOCOL: no   ASSESSMENT: 60 y.o. Robin Wilson presenting June 2021 with severe back pain, thoracic MRI 02/22/2020 suggestive of possible myeloma.  Work-up included:  (a) 03/05/2020 Myeloma panel shows no M protein but all immunoglobulins are decreased.  The lambda ratio is markedly abnormal at 131.34; review of the blood film is not  diagnostic  (b) 24-hour urine: markedly elevated Kappa light chains at 9,656; urine kappa/lambda ratio 4180.24, and urine M spike of 239.  (c) bone survey on 03/16/2020 shows rounded lucencies throughout the skull which could be venous lakes  (d) bone marrow biopsy on 7/27 confirms multiple myeloma with 44% plasma cells on the aspirate by manual differential, and 70 to 80% on the biopsy by CD138 immunohistochemistry  (e) cytogenetics  03/24/2020: no metaphase cells available for analysis  (f) myeloma FISH studies 03/24/2020 show t(11,14), with NO del(17p), t(1/14) or t(14.16)  (g) on 04/08/2020 LDH 240, beta-2-microglobulin 4.4, albumin 4.2  (h) R-ISS Stage: II   (1) bortezomib/dexamethasone started 03/31/2020, most recent dose 10/28/2020  (a) lenalidomide added on 04/21/2020, stopped on 8/26 after two doses secondary to rash, resumed 04/29/2020 again with a very poor tolerance, treated with Medrol Dosepak, third attempt 05/14/2020, again unable to tolerate   (b) thalidomide added 06/04/2020 to bortezomib/ dexamethasone 05/27/2020, discontinued 06/24/2020 with rash  (c) daratumumab added 07/08/2020, most recent dose 10/21/2020   (2) denosumab/Xgeva started 05/06/2020, repeated every 28 days  (3) supportive therapy:  (a) valacyclovir 1g po BID  (b) ASA 325 mg daily  (c) Tixagevimab176m/Cilgavimab150mg x2, last dose 10/28/2020  (4) [facility] ([facility]) [facility] (a) stem cell reinfusion 11/24/2020  (b) white cell engraftment 12/05/2020, platelet engraftment 12/11/2020    PLAN: JDeyais recovering well clinically from her recent transplant.  [facility] and then a more formal visit in July.  We discussed the results of her lab test in detail.  We do not have a 24-hour urine which is of course the most important  one.  The kappa light chains in the serum are normal at 4.0, down from nearly 407 months ago.  The ratio is elevated chiefly  because the lambda light chains are so low.  The ratio is a little over 2.67.  The SPEP does show a 0.1 M spike.  This could indicate incomplete clearance of the clone or it could be transient and residual.  Obviously we need to repeat this and repeat labs have been set up for 02/25/2021, which means by the time she sees Dr. Aris Lot at [facility] mid July she will have those results on hand  Otherwise she is already scheduled to see me late August and we will obtain the lab work a few days before that so we we will have those results available for discussion     Sarajane Jews C. Suyash Amory, Wilson 01/27/21 1:03 PM Medical Oncology and Hematology [facility] [place], [place] [address] Tel. [phone]    Fax. [phone]   I, Wilburn Mylar, am acting as scribe for Dr. Virgie Wilson. Taishaun Levels.  I, Lurline Del Wilson, have reviewed the above documentation for accuracy and completeness, and I agree with the above.   *Total Encounter Time as defined by the Centers for Medicare and Medicaid Services includes, in addition to the face-to-face time of a patient visit (documented in the note above) non-face-to-face time: obtaining and reviewing outside history, ordering and reviewing medications, tests or procedures, care coordination (communications with other health care professionals or caregivers) and documentation in the medical record.

## 2021-01-27 NOTE — Telephone Encounter (Signed)
Scheduled appointment per 06/01 los. Patient is aware.

## 2021-01-29 ENCOUNTER — Other Ambulatory Visit: Payer: Self-pay | Admitting: *Deleted

## 2021-01-29 DIAGNOSIS — C9000 Multiple myeloma not having achieved remission: Secondary | ICD-10-CM

## 2021-01-30 LAB — KAPPA/LAMBDA LIGHT CHAINS, FREE, WITH RATIO, 24HR. URINE
FR KAPPA LT CH,24HR: 0.57 mg/24 hr
FR LAMBDA LT CH,24HR: <0.6 mg/24 hr
Free Kappa Lt Chains,Ur: 0.63 mg/L — ABNORMAL LOW (ref 1.17–86.46)
Free Kappa/Lambda Ratio: >0.94 — ABNORMAL LOW (ref 1.83–14.26)
Free Lambda Lt Chains,Ur: <0.67 mg/L (ref 0.27–15.21)
Total Volume: 900

## 2021-02-25 ENCOUNTER — Inpatient Hospital Stay: Payer: 59

## 2021-02-25 ENCOUNTER — Other Ambulatory Visit: Payer: Self-pay

## 2021-02-25 DIAGNOSIS — C9000 Multiple myeloma not having achieved remission: Secondary | ICD-10-CM

## 2021-02-25 DIAGNOSIS — Z7189 Other specified counseling: Secondary | ICD-10-CM

## 2021-02-25 LAB — CBC WITH DIFFERENTIAL (CANCER CENTER ONLY)
Abs Immature Granulocytes: 0.01 10*3/uL (ref 0.00–0.07)
Basophils Absolute: 0.1 10*3/uL (ref 0.0–0.1)
Basophils Relative: 1 %
Eosinophils Absolute: 1 10*3/uL — ABNORMAL HIGH (ref 0.0–0.5)
Eosinophils Relative: 14 %
HCT: 31.2 % — ABNORMAL LOW (ref 36.0–46.0)
Hemoglobin: 10.4 g/dL — ABNORMAL LOW (ref 12.0–15.0)
Immature Granulocytes: 0 %
Lymphocytes Relative: 24 %
Lymphs Abs: 1.8 10*3/uL (ref 0.7–4.0)
MCH: 37.1 pg — ABNORMAL HIGH (ref 26.0–34.0)
MCHC: 33.3 g/dL (ref 30.0–36.0)
MCV: 111.4 fL — ABNORMAL HIGH (ref 80.0–100.0)
Monocytes Absolute: 0.9 10*3/uL (ref 0.1–1.0)
Monocytes Relative: 12 %
Neutro Abs: 3.7 10*3/uL (ref 1.7–7.7)
Neutrophils Relative %: 49 %
Platelet Count: 305 10*3/uL (ref 150–400)
RBC: 2.8 MIL/uL — ABNORMAL LOW (ref 3.87–5.11)
RDW: 14.3 % (ref 11.5–15.5)
WBC Count: 7.4 10*3/uL (ref 4.0–10.5)
nRBC: 0 % (ref 0.0–0.2)

## 2021-02-25 LAB — CMP (CANCER CENTER ONLY)
ALT: 18 U/L (ref 0–44)
AST: 23 U/L (ref 15–41)
Albumin: 4.3 g/dL (ref 3.5–5.0)
Alkaline Phosphatase: 65 U/L (ref 38–126)
Anion gap: 5 (ref 5–15)
BUN: 18 mg/dL (ref 6–20)
CO2: 28 mmol/L (ref 22–32)
Calcium: 8.9 mg/dL (ref 8.9–10.3)
Chloride: 109 mmol/L (ref 98–111)
Creatinine: 0.63 mg/dL (ref 0.44–1.00)
GFR, Estimated: >60 mL/min (ref >60)
Glucose, Bld: 86 mg/dL (ref 70–99)
Potassium: 3.5 mmol/L (ref 3.5–5.1)
Sodium: 142 mmol/L (ref 135–145)
Total Bilirubin: 0.9 mg/dL (ref 0.3–1.2)
Total Protein: 6.1 g/dL — ABNORMAL LOW (ref 6.5–8.1)

## 2021-02-26 LAB — KAPPA/LAMBDA LIGHT CHAINS
Kappa free light chain: 5.1 mg/L (ref 3.3–19.4)
Kappa, lambda light chain ratio: 3.4 — ABNORMAL HIGH (ref 0.26–1.65)
Lambda free light chains: 1.5 mg/L — ABNORMAL LOW (ref 5.7–26.3)

## 2021-03-04 DIAGNOSIS — F419 Anxiety disorder, unspecified: Secondary | ICD-10-CM | POA: Insufficient documentation

## 2021-03-30 ENCOUNTER — Other Ambulatory Visit: Payer: Self-pay | Admitting: Oncology

## 2021-03-30 ENCOUNTER — Telehealth: Payer: Self-pay | Admitting: *Deleted

## 2021-03-30 DIAGNOSIS — C9000 Multiple myeloma not having achieved remission: Secondary | ICD-10-CM

## 2021-03-30 MED ORDER — ACYCLOVIR 800 MG PO TABS: 800.0000 mg | ORAL_TABLET | Freq: Two times a day (BID) | ORAL | 12 refills | Status: DC

## 2021-03-30 NOTE — Progress Notes (Signed)
[name] called to tell us that she is now ready to start maintenance therapy.  I am copying below the assessment portion of a note from Dr. Quentin Wilson at [facility] dated 03/16/2021.   Kappa light chain myeloma status post autologous stem cell transplant - Day 0 was 11/24/20. Robin Wilson is Day +113 post transplant Improved hematopoiesis. Transfusion independent.   Day +100 bone marrow evaluation normocellular with no increase in plasma cells, however MRD positive. Cytogenetics and FISH in process. PET/CT without FDG avid osseous or extramedullary disease.  Recommend the initiation of maintenance therapy with Velcade at her previous dose every other week with Dr. Delton Wilson. She should also resume bisphosphonate therapy for a total of 2 years of treatment.  Prophylaxis Prophylaxis Acyclovir 800 mg BID for HSV/VZV. Dapsone for PCP. Received Tixagevimab'150mg'$ /Cilgavimab'150mg'$  x2, last dose 10/28/2020. Dose does not need to be repeated.

## 2021-03-30 NOTE — Progress Notes (Signed)
DISCONTINUE OFF PATHWAY REGIMEN - Multiple Myeloma and Other Plasma Cell Dyscrasias   [id number]:Bortezomib (SQ) + Dexamethasone + Thalidomide:   A cycle is every 21 days:     Bortezomib      Thalidomide      Dexamethasone   **Always confirm dose/schedule in your pharmacy ordering system**  REASON: Other Reason PRIOR TREATMENT: Off Pathway: Bortezomib (SQ) + Dexamethasone + Thalidomide TREATMENT RESPONSE: Partial Response (PR)  START ON PATHWAY REGIMEN - Multiple Myeloma and Other Plasma Cell Dyscrasias     A cycle is every 2 weeks:     Bortezomib   **Always confirm dose/schedule in your pharmacy ordering system**  Patient Characteristics: Multiple Myeloma, Maintenance Therapy, Standard Risk Disease Classification: Multiple Myeloma R-ISS Staging: III Therapeutic Status: Maintenance Therapy Risk Status: Standard Risk Intent of Therapy: Non-Curative / Palliative Intent, Discussed with Patient

## 2021-03-30 NOTE — Telephone Encounter (Signed)
This RN spoke with the patient per her call stating she has completed her transplant therapy at [facility] and has been advised to start on maintenance Velcade " this month ".  She was seen at [facility] 03/24/2021.  Reviewed schedule with pt with noted follow up with MD on 8/22 - treatment for velcade will be added to that visit per discussion with pt.  This note will be forwarded to MD

## 2021-03-31 ENCOUNTER — Encounter: Payer: Self-pay | Admitting: Oncology

## 2021-03-31 ENCOUNTER — Other Ambulatory Visit (HOSPITAL_COMMUNITY): Payer: Self-pay

## 2021-03-31 MED ORDER — DAPSONE 100 MG PO TABS
100.0000 mg | ORAL_TABLET | Freq: Every day | ORAL | 4 refills | Status: DC
  Filled 2021-03-31: qty 90, 90d supply, fill #0

## 2021-04-02 ENCOUNTER — Other Ambulatory Visit (HOSPITAL_COMMUNITY): Payer: Self-pay

## 2021-04-02 ENCOUNTER — Ambulatory Visit: Payer: 59

## 2021-04-02 ENCOUNTER — Other Ambulatory Visit (HOSPITAL_BASED_OUTPATIENT_CLINIC_OR_DEPARTMENT_OTHER): Payer: Self-pay

## 2021-04-02 ENCOUNTER — Encounter: Payer: Self-pay | Admitting: Oncology

## 2021-04-10 ENCOUNTER — Other Ambulatory Visit (HOSPITAL_COMMUNITY): Payer: Self-pay

## 2021-04-12 NOTE — Progress Notes (Signed)
Pharmacist Chemotherapy Monitoring - Initial Assessment    Anticipated start date: 04/19/21   The following has been reviewed per standard work regarding the patient's treatment regimen: The patient's diagnosis, treatment plan and drug doses, and organ/hematologic function Lab orders and baseline tests specific to treatment regimen  The treatment plan start date, drug sequencing, and pre-medications Prior authorization status  Patient's documented medication list, including drug-drug interaction screen and prescriptions for anti-emetics and supportive care specific to the treatment regimen The drug concentrations, fluid compatibility, administration routes, and timing of the medications to be used The patient's access for treatment and lifetime cumulative dose history, if applicable  The patient's medication allergies and previous infusion related reactions, if applicable   Changes made to treatment plan:  N/A  Follow up needed:  Pending authorization for treatment    Philomena Course, RPH, 04/12/2021  1:48 PM

## 2021-04-15 ENCOUNTER — Other Ambulatory Visit: Payer: Self-pay | Admitting: *Deleted

## 2021-04-15 ENCOUNTER — Other Ambulatory Visit: Payer: Self-pay | Admitting: Oncology

## 2021-04-15 DIAGNOSIS — R7989 Other specified abnormal findings of blood chemistry: Secondary | ICD-10-CM

## 2021-04-15 DIAGNOSIS — C9000 Multiple myeloma not having achieved remission: Secondary | ICD-10-CM

## 2021-04-18 DIAGNOSIS — Z48298 Encounter for aftercare following other organ transplant: Secondary | ICD-10-CM | POA: Insufficient documentation

## 2021-04-18 NOTE — Progress Notes (Signed)
[place]  [phone] [phone]    ID: Robin Wilson DOB: 1961-04-21  MR#: [id number]  KGM#:[id number]  Patient Care Team: Maurice Small, MD as PCP - General (Family Medicine) Jakory Matsuo, Virgie Dad, MD as Consulting Physician (Oncology) Latanya Maudlin, MD as Consulting Physician (Orthopedic Surgery) Everlene Farrier, MD as Consulting Physician (Obstetrics and Gynecology) Domingo Pulse, MD (Urology) Ronald Lobo, MD as Consulting Physician (Gastroenterology) Markus Daft, MD as Consulting Physician (Interventional Radiology) Erline Levine, MD as Consulting Physician (Neurosurgery) Aris Lot Ramon Dredge, MD as Referring Physician (Hematology and Oncology) Milda Smart, MD as Referring Physician (Diagnostic Radiology) OTHER MD: Algis Liming. Lambird MD]  CHIEF COMPLAINT: Light chain myeloma  CURRENT TREATMENT: resume denomab/Xgeva; start maintenance therapy   INTERVAL HISTORY: [name] returns today for follow up of her myeloma.  She did really well with her transplant and was walking 3 miles a day when all of a sudden she started having shortness of breath.  She was evaluated for pulmonary emboli but echo which I am not able to access directly through epic for some reason showed an ejection fraction in the 20 to 25% range.  She saw cardiologist and has been started on the appropriate meds and in terms of shortness of breath her symptoms are much relieved and she has no peripheral edema.  She is understandably upset about this.  Otherwise she continues to recover from her transplant, her hair is coming back fully, skin still very dry, and her allergies have been "a mess" so she is being put on a brief steroid course  Her post-transplant restaging PET scan on 03/09/2021 s showed no FDG-avid osseous or extra-medullary disease.  Biopsy day 100 was MDR positive.  She was scheduled to start bortezomib maintenance but because of a heart problem this is being  delayed.  She will be seeing Dr. Aris Lot late September to review plans.  We are following the free kappa light chains in the urine and the kappa/lambda ratio in the urine, with today's results pending  REVIEW OF SYSTEMS: Jane is working from home, wondering if she should retire or not.  She is not otherwise exercising.  She does feel very fatigued.  She does not have shortness of breath cough chest pain or pressure.  She has had no intercurrent fevers.  She denies unusual headaches visual changes nausea vomiting or change in bowel or bladder habits.  She likes her current weight.  A detailed review of systems today was otherwise stable.   COVID 19 VACCINATION STATUS: Status post [employer] x2, waiting on the booster; received Evusheld approximately 6 months ago   HISTORY OF CURRENT ILLNESS: From the original intake note:  Robin Wilson is a 60 y.o. woman that presented with low and thoracic back pain to Dr. Latanya Maudlin. She notes muscle spasms and a low grade fever of 100 degrees.   Labs completed on 02/19/2020 revealed a CBC that was within normal limits except for RBC 2.39, Hgb 8.8, HCT 26.3, MCV 110.0, MCH 36.8, RDW 17.7, AMC 91. CMP was within normal limits except for potassium 3.3, CO2 35. Uric Acid was within normal limits. Urine Protein was abnormal at 57. C-reactive protein was within normal limits. ANA screening was negative.   Thorasic Spine MRI on 02/22/2020 revealed diffuse marrow T1 hypointensity involving vertibral bodies and some spinous processes associated with mild STTR hyperintensity with mild wedge compression fracture of T7 and mild right superior endplate compression fracture of T11 that are suspicious for pathologic  fractures. There is mild central zone narrowing at mid T7 with minimal-deformity of the cord. Differential considerationsinclude Lymphoma, multiple myeloma, and diffuse metastatic disease. There is partially visualized mild-moderate spinal canal stenosis and mild  deformity of the cord at C5-6 and C6-7 probable moderate-severe to severe-neutral foraminal narrowing which would be better assessed dedicated MRI of the cervical spine as clinically warranted.   Lab Results  Component Value Date   TOTALPROTELP 6.1 03/05/2020    Lab Results  Component Value Date   KPAFRELGTCHN 919.4 (H) 03/05/2020   LAMBDASER 7.0 03/05/2020   KAPLAMBRATIO 131.34 (H) 03/05/2020   Of note she had a CT of the abdomen on [facility]/03/2017 [facility] which showed a well-defined sclerotic lesion in the left iliac bone consistent with a bone island and no lytic or sclerotic lesions anywhere else.   PAST MEDICAL HISTORY: Past Medical History:  Diagnosis Date   Hypertension    Kidney stones    Kidney stones     PAST SURGICAL HISTORY: Past Surgical History:  Procedure Laterality Date   CESAREAN SECTION     CYSTOSCOPY WITH RETROGRADE PYELOGRAM, URETEROSCOPY AND STENT PLACEMENT Right 09/16/2015   Procedure: CYSTOSCOPY WITH RETROGRADE PYELOGRAM, URETEROSCOPY AND STENT PLACEMENT WITH HOLMIUM LASER ABLATION ;  Surgeon: Alexis Frock, MD;  Location: [facility];  Service: Urology;  Laterality: Right;   CYSTOSCOPY/RETROGRADE/URETEROSCOPY  06/04/2012   Procedure: CYSTOSCOPY/RETROGRADE/URETEROSCOPY;  Surgeon: Ailene Rud, MD;  Location: [facility];  Service: Urology;  Laterality: Left;   IR RADIOLOGIST EVAL & MGMT  04/29/2020   UTERINE FIBROID EMBOLIZATION      FAMILY HISTORY: No family history on file. The patient has 1 brother, 2 sisters.  Both her parents died in their 17s.  There is no cancer history in the family to her knowledge   GYNECOLOGIC HISTORY:  No LMP recorded. Patient has had an ablation. 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  Her 2  sons are from her first marriage, [name] who is a Administrator, arts at [employer] who graduated from [employer] in Merchandiser, retail.  In 2016 she married her second husband, [name].  He is a Loss adjuster, chartered for [employer].  He has 3 children of his own living in [place] and [place].    The patient attends [employer] DIRECTIVES: In the absence of any documents to the contrary the patient's husband is her healthcare power of attorney   HEALTH MAINTENANCE: Social History   Tobacco Use   Smoking status: Never   Smokeless tobacco: Never  Substance Use Topics   Alcohol use: No   Drug use: No    Colonoscopy: Due  PAP: Up-to-date  Bone density: Osteopenia Mammography: Up-to-date  Allergies  Allergen Reactions   Ace Inhibitors Other (See Comments)   Other Other (See Comments)   Sulfa Drugs Cross Reactors     From childhood per pt's mother , pt is unaware of allergy status    Current Outpatient Medications  Medication Sig Dispense Refill   acyclovir (ZOVIRAX) 800 MG tablet Take 1 tablet (800 mg total) by mouth 2 (two) times daily. 60 tablet 12   albuterol (PROVENTIL HFA;VENTOLIN HFA) 108 (90 Base) MCG/ACT inhaler Inhale 1-2 puffs into the lungs every 6 (six) hours as needed for wheezing  or shortness of breath.     Aspirin Buf,CaCarb-MgCarb-MgO, (BUFFERED ASPIRIN) 325 MG TABS Take 1 tablet by mouth daily. 30 tablet 12   calcium carbonate (OS-CAL - DOSED IN MG OF ELEMENTAL CALCIUM) 1250 MG tablet Take 1 tablet by mouth daily.       clobetasol cream (TEMOVATE) 0.05 %      dapsone 100 MG tablet Take 1 tablet (100 mg total) by mouth daily. 90 tablet 4   hydrOXYzine (ATARAX/VISTARIL) 10 MG tablet Take 1 tablet (10 mg total) by mouth every 4 (four) hours as needed for anxiety. 30 tablet 0   LORazepam (ATIVAN) 0.5 MG tablet Take 1 tablet (0.5 mg total) by mouth at bedtime as needed for anxiety. 60 tablet 0   losartan-hydrochlorothiazide (HYZAAR) 100-25 MG per tablet Take 1 tablet by  mouth daily.      Multiple Vitamins-Minerals (MULTIVITAMIN PO) Take 1 tablet by mouth daily.      potassium chloride (MICRO-K) 10 MEQ CR capsule Take 2 capsules (20 mEq total) by mouth 2 (two) times daily. 120 capsule 3   predniSONE (DELTASONE) 5 MG tablet Take 6 tablets day 1, 5 tablets day 2, and so on until done; take with food 21 tablet 0   prochlorperazine (COMPAZINE) 5 MG tablet Take 2 tablets (10 mg total) by mouth every 6 (six) hours as needed for nausea or vomiting. 30 tablet 1   sertraline (ZOLOFT) 50 MG tablet Take 75 mg by mouth daily.     tamsulosin (FLOMAX) 0.4 MG CAPS capsule Take 1 capsule by mouth daily.     tobramycin-dexamethasone (TOBRADEX) ophthalmic solution Place 1 drop into both eyes in the morning and at bedtime. 5 mL 0   traZODone (DESYREL) 50 MG tablet Take 50 mg by mouth at bedtime.     valACYclovir (VALTREX) 1000 MG tablet Take 1 tablet (1,000 mg total) by mouth 2 (two) times daily. (Patient taking differently: Take 1,000 mg by mouth daily. ) 30 tablet 6   No current facility-administered medications for this visit.    OBJECTIVE: White woman who appears stated age  For some reason vitals could not the dated in the smart phrase.  Today her weight was 121 point, temperature 98.1, blood pressure 120/75, oxygen on room air 94%, heart rate 16 and pulse 90.  There were no vitals filed for this visit. Wt Readings from Last 3 Encounters:  01/19/21 132 lb 4.8 oz (60 kg)  10/28/20 141 lb 14.4 oz (64.4 kg)  10/21/20 141 lb 1.9 oz (64 kg)   There is no height or weight on file to calculate BMI.    ECOG FS:2 - Symptomatic, <50% confined to bed  Sclerae unicteric, EOMs intact Wearing a mask No cervical or supraclavicular adenopathy Lungs no rales or rhonchi, no wheezes Heart regular rate and rhythm Abd soft, nontender, positive bowel sounds MSK no focal spinal tenderness, no lower lymphedema Neuro: nonfocal, well oriented, appropriate affect Breasts: Deferred  LAB  RESULTS:  CMP     Component Value Date/Time   NA 142 02/25/2021 1606   K 3.5 02/25/2021 1606   CL 109 02/25/2021 1606   CO2 28 02/25/2021 1606   GLUCOSE 86 02/25/2021 1606   BUN 18 02/25/2021 1606   CREATININE 0.63 02/25/2021 1606   CALCIUM 8.9 02/25/2021 1606   PROT 6.1 (L) 02/25/2021 1606   ALBUMIN 4.3 02/25/2021 1606   AST 23 02/25/2021 1606   ALT 18 02/25/2021 1606   ALKPHOS 65 02/25/2021 1606   BILITOT 0.9  02/25/2021 1606   GFRNONAA >60 02/25/2021 1606   GFRAA >60 05/27/2020 1428    Lab Results  Component Value Date   TOTALPROTELP 5.6 (L) 01/19/2021   ALBUMINELP 3.6 10/21/2020   A1GS 0.2 10/21/2020   A2GS 0.7 10/21/2020   BETS 0.9 10/21/2020   GAMS 0.4 10/21/2020   MSPIKE 0.2 (H) 10/21/2020   SPEI Comment 10/21/2020     Lab Results  Component Value Date   KPAFRELGTCHN 5.1 02/25/2021   LAMBDASER 1.5 (L) 02/25/2021   KAPLAMBRATIO 3.40 (H) 02/25/2021    Lab Results  Component Value Date   WBC 7.4 02/25/2021   NEUTROABS 3.7 02/25/2021   HGB 10.4 (L) 02/25/2021   HCT 31.2 (L) 02/25/2021   MCV 111.4 (H) 02/25/2021   PLT 305 02/25/2021    No results found for: LABCA2  No components found for: JQZESP233  No results for input(s): INR in the last 168 hours.  No results found for: LABCA2  No results found for: AQT622  No results found for: QJF354  No results found for: TGY563  No results found for: CA2729  No components found for: HGQUANT  No results found for: CEA1 / No results found for: CEA1   No results found for: AFPTUMOR  No results found for: CHROMOGRNA  No results found for: HGBA, HGBA2QUANT, HGBFQUANT, HGBSQUAN (Hemoglobinopathy evaluation)   Lab Results  Component Value Date   LDH 240 (H) 04/08/2020    No results found for: IRON, TIBC, IRONPCTSAT (Iron and TIBC)  No results found for: FERRITIN  Urinalysis    Component Value Date/Time   COLORURINE YELLOW 09/15/2015 2315   APPEARANCEUR CLOUDY (A) 09/15/2015 2315    LABSPEC 1.014 09/15/2015 2315   PHURINE 8.5 (H) 09/15/2015 2315   GLUCOSEU NEGATIVE 09/15/2015 2315   HGBUR SMALL (A) 09/15/2015 2315   BILIRUBINUR NEGATIVE 09/15/2015 2315   KETONESUR 15 (A) 09/15/2015 2315   PROTEINUR NEGATIVE 09/15/2015 2315   UROBILINOGEN 1.0 05/24/2013 1913   NITRITE NEGATIVE 09/15/2015 2315   LEUKOCYTESUR TRACE (A) 09/15/2015 2315    STUDIES:  No results found.   ELIGIBLE FOR AVAILABLE RESEARCH PROTOCOL: no   ASSESSMENT: 60 y.o. [place] woman presenting June 2021 with severe back pain, thoracic MRI 02/22/2020 suggestive of possible myeloma.  Work-up included:  (a) 03/05/2020 Myeloma panel shows no M protein but all immunoglobulins are decreased.  The lambda ratio is markedly abnormal at 131.34; review of the blood film is not diagnostic  (b) 24-hour urine: markedly elevated Kappa light chains at 9,656; urine kappa/lambda ratio 4180.24, and urine M spike of 239.  (c) bone survey on 03/16/2020 shows rounded lucencies throughout the skull which could be venous lakes  (d) bone marrow biopsy on 7/27 confirms multiple myeloma with 44% plasma cells on the aspirate by manual differential, and 70 to 80% on the biopsy by CD138 immunohistochemistry  (e) cytogenetics  03/24/2020: no metaphase cells available for analysis  (f) myeloma FISH studies 03/24/2020 show t(11,14), with NO del(17p), t(1/14) or t(14.16)  (g) on 04/08/2020 LDH 240, beta-2-microglobulin 4.4, albumin 4.2  (h) R-ISS Stage: II   (1) bortezomib/dexamethasone started 03/31/2020, most recent dose 10/28/2020  (a) lenalidomide added on 04/21/2020, stopped on 8/26 after two doses secondary to rash, resumed 04/29/2020 again with a very poor tolerance, treated with Medrol Dosepak, third attempt 05/14/2020, again unable to tolerate   (b) thalidomide added 06/04/2020 to bortezomib/ dexamethasone 05/27/2020, discontinued 06/24/2020 with rash  (c) daratumumab added 07/08/2020, most recent dose  10/21/2020   (  2) denosumab/Xgeva started 05/06/2020, repeated every 28 days  (3) supportive therapy:  (a) valacyclovir 1g po BID  (b) ASA 325 mg daily  (c) Tixagevimab131m/Cilgavimab150mg x2, last dose 10/28/2020  (4) [facility] ([facility]) s/p high-dose therapy with [facility] stem [facility] (a) stem cell reinfusion 11/24/2020  (b) white cell engraftment 12/05/2020, platelet engraftment 12/11/2020  (c) Day +100 bone marrow evaluation normocellular with no increase in plasma cells, however MRD positive.   (d) PET 03/09/2021 showed no FDG-avid bone or other disease; increased tracer uptake in left atrial wall led to cardiac consultation  (6) maintenance bortezomib indicated  (a) start of mintenance therapy delayed by post-transplant CHF   (i) echo 07/  /2022 showed an ejection fraction in the 20-25% range    PLAN: JHanghas vigorous marrow function which is very favorable.  Despite that she understands her immune system is not going to work normally for some time and she will receive passive immunity for SARS-CoV-2 with Evusheld this month.  She should receive her COVID vaccines records in addition to that.  She will resume denosumab/Xgeva later this week.  That will be repeated monthly.  We are ready to start maintenance once she has seen Dr. LAris Lotand that he decides whether he indeed wants this to go ahead with Velcade or wants to try something different recalling of course that she was unable to tolerate lenalidomide or thalidomide.  I suggested she wait a little before making any definitive decisions regarding work or retirement since her heart might indeed improve with a little bit more time.  She does need mammography sometime this year and she would like Dr. BCristina Gongbefore he retires to repeat a colonoscopy.  She will contact his office regarding that.  Otherwise she will return to see me  June 03, 2021 and we will likely start her maintenance treatments at that time  Total encounter time 30 minutes.   GVirgie Dad Soumya Colson, MD 04/18/21 7:56 PM Medical Oncology and Hematology [facility] [id number]. [phone]   Fax. [phone]  I, KWilburn Mylar am acting as scribe for Dr. GVirgie Dad Connee Ikner.  I, GLurline DelMD, have reviewed the above documentation for accuracy and completeness, and I agree with the above.   *Total Encounter Time as defined by the Centers for Medicare and Medicaid Services includes, in addition to the face-to-face time of a patient visit (documented in the note above) non-face-to-face time: obtaining and reviewing outside history, ordering and reviewing medications, tests or procedures, care coordination (communications with other health care professionals or caregivers) and documentation in the medical record.

## 2021-04-19 ENCOUNTER — Inpatient Hospital Stay: Payer: 59

## 2021-04-19 ENCOUNTER — Other Ambulatory Visit: Payer: Self-pay

## 2021-04-19 ENCOUNTER — Other Ambulatory Visit: Payer: Self-pay | Admitting: *Deleted

## 2021-04-19 ENCOUNTER — Other Ambulatory Visit: Payer: Self-pay | Admitting: Pharmacist

## 2021-04-19 ENCOUNTER — Inpatient Hospital Stay: Payer: 59 | Attending: Oncology

## 2021-04-19 ENCOUNTER — Inpatient Hospital Stay (HOSPITAL_BASED_OUTPATIENT_CLINIC_OR_DEPARTMENT_OTHER): Payer: 59 | Admitting: Oncology

## 2021-04-19 VITALS — BP 120/75 | HR 90 | Temp 98.1°F | Resp 16 | Ht 69.0 in | Wt 121.6 lb

## 2021-04-19 DIAGNOSIS — C9000 Multiple myeloma not having achieved remission: Secondary | ICD-10-CM | POA: Insufficient documentation

## 2021-04-19 DIAGNOSIS — Z79899 Other long term (current) drug therapy: Secondary | ICD-10-CM | POA: Insufficient documentation

## 2021-04-19 DIAGNOSIS — Z48298 Encounter for aftercare following other organ transplant: Secondary | ICD-10-CM

## 2021-04-19 DIAGNOSIS — Z7982 Long term (current) use of aspirin: Secondary | ICD-10-CM | POA: Insufficient documentation

## 2021-04-19 DIAGNOSIS — R7989 Other specified abnormal findings of blood chemistry: Secondary | ICD-10-CM

## 2021-04-19 DIAGNOSIS — Z7189 Other specified counseling: Secondary | ICD-10-CM

## 2021-04-19 DIAGNOSIS — Z9484 Stem cells transplant status: Secondary | ICD-10-CM | POA: Insufficient documentation

## 2021-04-19 LAB — CBC WITH DIFFERENTIAL (CANCER CENTER ONLY)
Abs Immature Granulocytes: 0.03 10*3/uL (ref 0.00–0.07)
Basophils Absolute: 0.1 10*3/uL (ref 0.0–0.1)
Basophils Relative: 1 %
Eosinophils Absolute: 0.6 10*3/uL — ABNORMAL HIGH (ref 0.0–0.5)
Eosinophils Relative: 6 %
HCT: 36.9 % (ref 36.0–46.0)
Hemoglobin: 12.6 g/dL (ref 12.0–15.0)
Immature Granulocytes: 0 %
Lymphocytes Relative: 27 %
Lymphs Abs: 2.8 10*3/uL (ref 0.7–4.0)
MCH: 36 pg — ABNORMAL HIGH (ref 26.0–34.0)
MCHC: 34.1 g/dL (ref 30.0–36.0)
MCV: 105.4 fL — ABNORMAL HIGH (ref 80.0–100.0)
Monocytes Absolute: 1.1 10*3/uL — ABNORMAL HIGH (ref 0.1–1.0)
Monocytes Relative: 11 %
Neutro Abs: 5.6 10*3/uL (ref 1.7–7.7)
Neutrophils Relative %: 55 %
Platelet Count: 299 10*3/uL (ref 150–400)
RBC: 3.5 MIL/uL — ABNORMAL LOW (ref 3.87–5.11)
RDW: 14.6 % (ref 11.5–15.5)
WBC Count: 10.3 10*3/uL (ref 4.0–10.5)
nRBC: 0 % (ref 0.0–0.2)

## 2021-04-19 LAB — CMP (CANCER CENTER ONLY)
ALT: 24 U/L (ref 0–44)
AST: 19 U/L (ref 15–41)
Albumin: 4.9 g/dL (ref 3.5–5.0)
Alkaline Phosphatase: 81 U/L (ref 38–126)
Anion gap: 13 (ref 5–15)
BUN: 18 mg/dL (ref 6–20)
CO2: 30 mmol/L (ref 22–32)
Calcium: 9.5 mg/dL (ref 8.9–10.3)
Chloride: 101 mmol/L (ref 98–111)
Creatinine: 0.84 mg/dL (ref 0.44–1.00)
GFR, Estimated: >60 mL/min (ref >60)
Glucose, Bld: 79 mg/dL (ref 70–99)
Potassium: 3.2 mmol/L — ABNORMAL LOW (ref 3.5–5.1)
Sodium: 144 mmol/L (ref 135–145)
Total Bilirubin: 1.5 mg/dL — ABNORMAL HIGH (ref 0.3–1.2)
Total Protein: 6.9 g/dL (ref 6.5–8.1)

## 2021-04-19 LAB — RETICULOCYTES
Immature Retic Fract: 18.9 % — ABNORMAL HIGH (ref 2.3–15.9)
RBC.: 3.51 MIL/uL — ABNORMAL LOW (ref 3.87–5.11)
Retic Count, Absolute: 222.5 10*3/uL — ABNORMAL HIGH (ref 19.0–186.0)
Retic Ct Pct: 6.3 % — ABNORMAL HIGH (ref 0.4–3.1)

## 2021-04-19 MED ORDER — EPINEPHRINE 0.3 MG/0.3ML IJ SOAJ: 0.3000 mg | Freq: Once | INTRAMUSCULAR | Status: DC | PRN

## 2021-04-19 MED ORDER — TIXAGEVIMAB (PART OF EVUSHELD) INJECTION
300.0000 mg | Freq: Once | INTRAMUSCULAR | Status: DC
Start: 2021-04-19 — End: 2021-04-19
  Filled 2021-04-19: qty 3

## 2021-04-19 MED ORDER — DIPHENHYDRAMINE HCL 50 MG/ML IJ SOLN: 50.0000 mg | Freq: Once | INTRAMUSCULAR | Status: DC | PRN

## 2021-04-19 MED ORDER — ALBUTEROL SULFATE HFA 108 (90 BASE) MCG/ACT IN AERS: 2.0000 | INHALATION_SPRAY | Freq: Once | RESPIRATORY_TRACT | Status: DC | PRN

## 2021-04-19 MED ORDER — METHYLPREDNISOLONE SODIUM SUCC 125 MG IJ SOLR: 125.0000 mg | Freq: Once | INTRAMUSCULAR | Status: DC | PRN

## 2021-04-19 MED ORDER — CILGAVIMAB (PART OF EVUSHELD) INJECTION
300.0000 mg | Freq: Once | INTRAMUSCULAR | Status: DC
  Filled 2021-04-19: qty 3

## 2021-04-20 ENCOUNTER — Telehealth: Payer: Self-pay | Admitting: Oncology

## 2021-04-20 LAB — KAPPA/LAMBDA LIGHT CHAINS
Kappa free light chain: 6 mg/L (ref 3.3–19.4)
Kappa, lambda light chain ratio: >4 — ABNORMAL HIGH (ref 0.26–1.65)
Lambda free light chains: <1.5 mg/L — ABNORMAL LOW (ref 5.7–26.3)

## 2021-04-20 NOTE — Telephone Encounter (Signed)
Scheduled appointment per 08/22 los. Left message.

## 2021-04-23 ENCOUNTER — Ambulatory Visit: Payer: 59

## 2021-05-03 ENCOUNTER — Other Ambulatory Visit: Payer: Self-pay | Admitting: Oncology

## 2021-05-05 ENCOUNTER — Ambulatory Visit: Payer: 59

## 2021-05-06 ENCOUNTER — Ambulatory Visit: Payer: 59

## 2021-05-07 ENCOUNTER — Other Ambulatory Visit: Payer: Self-pay

## 2021-05-07 ENCOUNTER — Ambulatory Visit: Payer: 59 | Attending: Internal Medicine

## 2021-05-07 DIAGNOSIS — Z23 Encounter for immunization: Secondary | ICD-10-CM

## 2021-05-22 ENCOUNTER — Ambulatory Visit
Admission: RE | Admit: 2021-05-22 | Discharge: 2021-05-22 | Disposition: A | Discharge status: Home / Self Care | Payer: 59 | Source: Ambulatory Visit | Attending: Oncology | Admitting: Oncology

## 2021-05-22 ENCOUNTER — Other Ambulatory Visit: Payer: Self-pay

## 2021-05-22 DIAGNOSIS — Z48298 Encounter for aftercare following other organ transplant: Secondary | ICD-10-CM

## 2021-05-22 DIAGNOSIS — C9000 Multiple myeloma not having achieved remission: Secondary | ICD-10-CM

## 2021-05-26 ENCOUNTER — Other Ambulatory Visit: Payer: Self-pay | Admitting: Oncology

## 2021-05-26 ENCOUNTER — Telehealth: Payer: Self-pay | Admitting: *Deleted

## 2021-05-26 NOTE — Telephone Encounter (Signed)
Robin Wilson called to this RN to state she was seen yesterday at [facility] - Dr Quentin Ore - who stated per the recent CT lung status has improved- they are hoping to proceed with transplant.  To qualify she needs an echo which was scheduled for this Thursday- if she does not get the echo at this time - her transplant would be delayed until December.  She is scheduled to see Dr Jannifer Rodney tomorrow and needs to cancel the above " but want him to know why as well see the results of my ECHO before we proceed with the maintenance chemotherapy next week.  This RN canceled appt for tomorrow and will send this note to MD for further follow up.

## 2021-05-26 NOTE — Progress Notes (Signed)
Robin Wilson [facility].  A repeat CT of the chest shows significant improvement and she is oxygenating much better and generally feels much better.  She is scheduled for an echo tomorrow afternoon 05/27/2021 and so her visit here has been canceled.  Assuming the echo is favorable she is scheduled for Velcade here 06/03/2021.  I am not here that day.  She will see my physicians assistant then.  I am also scheduling jail for her next appointment 06/17/2021 after which if all goes well she will be continued on maintenance every 2 weeks.

## 2021-05-27 ENCOUNTER — Telehealth: Payer: Self-pay | Admitting: Oncology

## 2021-05-27 ENCOUNTER — Telehealth: Payer: Self-pay

## 2021-05-27 ENCOUNTER — Inpatient Hospital Stay: Payer: 59 | Admitting: Oncology

## 2021-05-27 NOTE — Telephone Encounter (Signed)
Scheduled per sch msg. Called and spoke with patient. Confirmed appt  

## 2021-06-02 ENCOUNTER — Other Ambulatory Visit: Payer: Self-pay | Admitting: *Deleted

## 2021-06-02 DIAGNOSIS — C9000 Multiple myeloma not having achieved remission: Secondary | ICD-10-CM

## 2021-06-03 ENCOUNTER — Ambulatory Visit: Payer: 59 | Admitting: Oncology

## 2021-06-03 ENCOUNTER — Other Ambulatory Visit: Payer: 59

## 2021-06-03 ENCOUNTER — Ambulatory Visit: Payer: 59

## 2021-06-10 NOTE — Progress Notes (Signed)
Pharmacist Chemotherapy Monitoring - Initial Assessment    Anticipated start date: 06/17/21   The following has been reviewed per standard work regarding the patient's treatment regimen: The patient's diagnosis, treatment plan and drug doses, and organ/hematologic function Lab orders and baseline tests specific to treatment regimen  The treatment plan start date, drug sequencing, and pre-medications Prior authorization status  Patient's documented medication list, including drug-drug interaction screen and prescriptions for anti-emetics and supportive care specific to the treatment regimen The drug concentrations, fluid compatibility, administration routes, and timing of the medications to be used The patient's access for treatment and lifetime cumulative dose history, if applicable  The patient's medication allergies and previous infusion related reactions, if applicable   Changes made to treatment plan:  treatment plan date  Follow up needed:  N/A   Judge Stall, [place], 06/10/2021  9:50 AM

## 2021-06-15 ENCOUNTER — Telehealth: Payer: Self-pay | Admitting: *Deleted

## 2021-06-15 NOTE — Telephone Encounter (Signed)
This RN received VM from the patient as well as from Dr Quentin Ore ( [facility]) stating she was seen last week - recommends to proceed with velcade maintenance to maintain noted remission post Stem Cell Transplant.  Pt is scheduled for lab- md and treatment on 06/17/2021.  This note will be forwarded to MD for review.  This RN also called Delanna to inform her of above and treatment is scheduled for 10/20,

## 2021-06-16 ENCOUNTER — Other Ambulatory Visit: Payer: Self-pay | Admitting: Oncology

## 2021-06-16 DIAGNOSIS — C9000 Multiple myeloma not having achieved remission: Secondary | ICD-10-CM

## 2021-06-16 NOTE — Progress Notes (Unsigned)
[place]  [phone] [phone]    ID: Robin Wilson DOB: 1961-03-16  MR#: [id number]  AVW#:[id number]  Patient Care Team: Maurice Small, MD (Inactive) as PCP - General (Family Medicine) Melvin Marmo, Virgie Dad, MD as Consulting Physician (Oncology) Latanya Maudlin, MD as Consulting Physician (Orthopedic Surgery) Everlene Farrier, MD as Consulting Physician (Obstetrics and Gynecology) Domingo Pulse, MD (Urology) Ronald Lobo, MD as Consulting Physician (Gastroenterology) Markus Daft, MD as Consulting Physician (Interventional Radiology) Erline Levine, MD as Consulting Physician (Neurosurgery) Aris Lot Ramon Dredge, MD as Referring Physician (Hematology and Oncology) Milda Smart, MD as Referring Physician (Diagnostic Radiology) OTHER MD: Algis Liming. Lambird MD]  CHIEF COMPLAINT: Light chain myeloma  CURRENT TREATMENT: resume denomab/Xgeva; start maintenance therapy   INTERVAL HISTORY: Robin Wilson returns today for follow up of her myeloma.  She did really well with her transplant and was walking 3 miles a day when all of a sudden she started having shortness of breath.  She was evaluated for pulmonary emboli but echo which I am not able to access directly through epic for some reason showed an ejection fraction in the 20 to 25% range.  She saw cardiologist and has been started on the appropriate meds and in terms of shortness of breath her symptoms are much relieved and she has no peripheral edema.  She is understandably upset about this.  Otherwise she continues to recover from her transplant, her hair is coming back fully, skin still very dry, and her allergies have been "a mess" so she is being put on a brief steroid course  Her post-transplant restaging PET scan on 03/09/2021 s showed no FDG-avid osseous or extra-medullary disease.  Biopsy day 100 was MDR positive.  She was scheduled to start bortezomib maintenance but because of a heart problem this is  being delayed.  She will be seeing Dr. Aris Lot late September to review plans.  We are following the free kappa light chains in the urine and the kappa/lambda ratio in the urine, with today's results pending  REVIEW OF SYSTEMS: Robin Wilson is working from home, wondering if she should retire or not.  She is not otherwise exercising.  She does feel very fatigued.  She does not have shortness of breath cough chest pain or pressure.  She has had no intercurrent fevers.  She denies unusual headaches visual changes nausea vomiting or change in bowel or bladder habits.  She likes her current weight.  A detailed review of systems today was otherwise stable.   COVID 19 VACCINATION STATUS: Status post [employer]a x2, waiting on the booster; received Evusheld approximately 6 months ago   HISTORY OF CURRENT ILLNESS: From the original intake note:  Robin Wilson is a 60 y.o. woman that presented with low and thoracic back pain to Dr. Latanya Maudlin. She notes muscle spasms and a low grade fever of 100 degrees.   Labs completed on 02/19/2020 revealed a CBC that was within normal limits except for RBC 2.39, Hgb 8.8, HCT 26.3, MCV 110.0, MCH 36.8, RDW 17.7, AMC 91. CMP was within normal limits except for potassium 3.3, CO2 35. Uric Acid was within normal limits. Urine Protein was abnormal at 57. C-reactive protein was within normal limits. ANA screening was negative.   Thorasic Spine MRI on 02/22/2020 revealed diffuse marrow T1 hypointensity involving vertibral bodies and some spinous processes associated with mild STTR hyperintensity with mild wedge compression fracture of T7 and mild right superior endplate compression fracture of T11 that are suspicious for  pathologic fractures. There is mild central zone narrowing at mid T7 with minimal-deformity of the cord. Differential considerationsinclude Lymphoma, multiple myeloma, and diffuse metastatic disease. There is partially visualized mild-moderate spinal canal stenosis and  mild deformity of the cord at C5-6 and C6-7 probable moderate-severe to severe-neutral foraminal narrowing which would be better assessed dedicated MRI of the cervical spine as clinically warranted.   Lab Results  Component Value Date   TOTALPROTELP 6.1 03/05/2020    Lab Results  Component Value Date   KPAFRELGTCHN 919.4 (H) 03/05/2020   LAMBDASER 7.0 03/05/2020   KAPLAMBRATIO 131.34 (H) 03/05/2020   Of note she had a CT of the abdomen on 01/03/2017 at [facility] which showed a well-defined sclerotic lesion in the left iliac bone consistent with a bone island and no lytic or sclerotic lesions anywhere else.   PAST MEDICAL HISTORY: Past Medical History:  Diagnosis Date   Hypertension    Kidney stones    Kidney stones     PAST SURGICAL HISTORY: Past Surgical History:  Procedure Laterality Date   CESAREAN SECTION     CYSTOSCOPY WITH RETROGRADE PYELOGRAM, URETEROSCOPY AND STENT PLACEMENT Right 09/16/2015   Procedure: CYSTOSCOPY WITH RETROGRADE PYELOGRAM, URETEROSCOPY AND STENT PLACEMENT WITH HOLMIUM LASER ABLATION ;  Surgeon: Alexis Frock, MD;  Location: [facility];  Service: Urology;  Laterality: Right;   CYSTOSCOPY/RETROGRADE/URETEROSCOPY  06/04/2012   Procedure: CYSTOSCOPY/RETROGRADE/URETEROSCOPY;  Surgeon: Ailene Rud, MD;  Location: [facility];  Service: Urology;  Laterality: Left;   IR RADIOLOGIST EVAL & MGMT  04/29/2020   UTERINE FIBROID EMBOLIZATION      FAMILY HISTORY: Family History  Problem Relation Age of Onset   Breast cancer Neg Hx    The patient has 1 brother, 2 sisters.  Both her parents died in their 68s.  There is no cancer history in the family to her knowledge   GYNECOLOGIC HISTORY:  No LMP recorded. Patient has had an ablation. 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  Her 2 sons are from her first marriage, Robin Wilson who is a Administrator, arts at [employer] who graduated from [employer] in Merchandiser, retail.  In 2016 she married her second husband, [name].  He is a Loss adjuster, chartered for [employer].  He has 3 children of his own living in [place] and [place].    The patient attends [employer] DIRECTIVES: In the absence of any documents to the contrary the patient's husband is her healthcare power of attorney   HEALTH MAINTENANCE: Social History   Tobacco Use   Smoking status: Never   Smokeless tobacco: Never  Substance Use Topics   Alcohol use: No   Drug use: No    Colonoscopy: Due  PAP: Up-to-date  Bone density: Osteopenia Mammography: Up-to-date  Allergies  Allergen Reactions   Ace Inhibitors Other (See Comments)   Other Other (See Comments)   Sulfa Drugs Cross Reactors     From childhood per pt's mother , pt is unaware of allergy status    Current Outpatient Medications  Medication Sig Dispense Refill   acyclovir (ZOVIRAX) 800 MG tablet Take 1 tablet (800 mg total) by mouth 2 (two) times daily. 60 tablet 12   calcium carbonate (OS-CAL - DOSED IN MG OF ELEMENTAL CALCIUM)  1250 MG tablet Take 1 tablet by mouth daily.       Cetirizine HCl 10 MG CAPS Take by mouth.     clobetasol cream (TEMOVATE) 0.05 %      clonazePAM (KLONOPIN) 0.5 MG tablet Take 0.5 mg by mouth 2 (two) times daily as needed.     cyclobenzaprine (FLEXERIL) 10 MG tablet Take by mouth.     dapsone 100 MG tablet Take 1 tablet (100 mg total) by mouth daily. 90 tablet 4   folic acid (FOLVITE) 1 MG tablet Take 1 mg by mouth daily.     furosemide (LASIX) 40 MG tablet Take by mouth.     hydrocortisone 2.5 % ointment Apply to the eczema rash on the face once or twice daily as needed.     hydrOXYzine (ATARAX/VISTARIL) 10 MG tablet Take 1 tablet (10 mg total) by mouth every 4 (four) hours as needed for anxiety. 30 tablet 0    LORazepam (ATIVAN) 0.5 MG tablet Take 1 tablet (0.5 mg total) by mouth at bedtime as needed for anxiety. 60 tablet 0   losartan-hydrochlorothiazide (HYZAAR) 100-25 MG per tablet Take 1 tablet by mouth daily.      metoprolol succinate (TOPROL-XL) 25 MG 24 hr tablet Take 12.5 mg by mouth daily.     Multiple Vitamins-Minerals (MULTIVITAMIN PO) Take 1 tablet by mouth daily.      naproxen (NAPROSYN) 500 MG tablet Take by mouth.     predniSONE (DELTASONE) 10 MG tablet Take by mouth.     prochlorperazine (COMPAZINE) 5 MG tablet Take 2 tablets (10 mg total) by mouth every 6 (six) hours as needed for nausea or vomiting. 30 tablet 1   sertraline (ZOLOFT) 50 MG tablet Take 75 mg by mouth daily.     spironolactone (ALDACTONE) 25 MG tablet Take 25 mg by mouth daily.     No current facility-administered medications for this visit.    OBJECTIVE: White woman who appears stated age  For some reason vitals could not the dated in the smart phrase.  Today her weight was 121 point, temperature 98.1, blood pressure 120/75, oxygen on room air 94%, heart rate 16 and pulse 90.  There were no vitals filed for this visit. Wt Readings from Last 3 Encounters:  04/19/21 121 lb 9.6 oz (55.2 kg)  01/19/21 132 lb 4.8 oz (60 kg)  10/28/20 141 lb 14.4 oz (64.4 kg)   There is no height or weight on file to calculate BMI.    ECOG FS:2 - Symptomatic, <50% confined to bed  Sclerae unicteric, EOMs intact Wearing a mask No cervical or supraclavicular adenopathy Lungs no rales or rhonchi, no wheezes Heart regular rate and rhythm Abd soft, nontender, positive bowel sounds MSK no focal spinal tenderness, no lower lymphedema Neuro: nonfocal, well oriented, appropriate affect Breasts: Deferred  LAB RESULTS:  CMP     Component Value Date/Time   NA 144 04/19/2021 1026   K 3.2 (L) 04/19/2021 1026   CL 101 04/19/2021 1026   CO2 30 04/19/2021 1026   GLUCOSE 79 04/19/2021 1026   BUN 18 04/19/2021 1026   CREATININE 0.84  04/19/2021 1026   CALCIUM 9.5 04/19/2021 1026   PROT 6.9 04/19/2021 1026   ALBUMIN 4.9 04/19/2021 1026   AST 19 04/19/2021 1026   ALT 24 04/19/2021 1026   ALKPHOS 81 04/19/2021 1026   BILITOT 1.5 (H) 04/19/2021 1026   GFRNONAA >60 04/19/2021 1026   GFRAA >60 05/27/2020 1428    Lab Results  Component Value Date   TOTALPROTELP 5.6 (L) 01/19/2021   ALBUMINELP 3.6 10/21/2020   A1GS 0.2 10/21/2020   A2GS 0.7 10/21/2020   BETS 0.9 10/21/2020   GAMS 0.4 10/21/2020   MSPIKE 0.2 (H) 10/21/2020   SPEI Comment 10/21/2020     Lab Results  Component Value Date   KPAFRELGTCHN 6.0 04/19/2021   LAMBDASER <1.5 (L) 04/19/2021   KAPLAMBRATIO >4.00 (H) 04/19/2021    Lab Results  Component Value Date   WBC 10.3 04/19/2021   NEUTROABS 5.6 04/19/2021   HGB 12.6 04/19/2021   HCT 36.9 04/19/2021   MCV 105.4 (H) 04/19/2021   PLT 299 04/19/2021    No results found for: LABCA2  No components found for: [id number]  No results for input(s): INR in the last 168 hours.  No results found for: LABCA2  No results found for: NWG956  No results found for: OZH086  No results found for: VHQ469  No results found for: CA2729  No components found for: HGQUANT  No results found for: CEA1 / No results found for: CEA1   No results found for: AFPTUMOR  No results found for: CHROMOGRNA  No results found for: HGBA, HGBA2QUANT, HGBFQUANT, HGBSQUAN (Hemoglobinopathy evaluation)   Lab Results  Component Value Date   LDH 240 (H) 04/08/2020    No results found for: IRON, TIBC, IRONPCTSAT (Iron and TIBC)  No results found for: FERRITIN  Urinalysis    Component Value Date/Time   COLORURINE YELLOW 09/15/2015 2315   APPEARANCEUR CLOUDY (A) 09/15/2015 2315   LABSPEC 1.014 09/15/2015 2315   PHURINE 8.5 (H) 09/15/2015 2315   GLUCOSEU NEGATIVE 09/15/2015 2315   HGBUR SMALL (A) 09/15/2015 2315   BILIRUBINUR NEGATIVE 09/15/2015 2315   KETONESUR 15 (A) 09/15/2015 2315   PROTEINUR NEGATIVE  09/15/2015 2315   UROBILINOGEN 1.0 05/24/2013 1913   NITRITE NEGATIVE 09/15/2015 2315   LEUKOCYTESUR TRACE (A) 09/15/2015 2315    STUDIES:  MM 3D SCREEN BREAST BILATERAL  Result Date: 05/27/2021 CLINICAL DATA:  Screening. EXAM: DIGITAL SCREENING BILATERAL MAMMOGRAM WITH TOMOSYNTHESIS AND CAD TECHNIQUE: Bilateral screening digital craniocaudal and mediolateral oblique mammograms were obtained. Bilateral screening digital breast tomosynthesis was performed. The images were evaluated with computer-aided detection. COMPARISON:  Previous exam(s). ACR Breast Density Category b: There are scattered areas of fibroglandular density. FINDINGS: There are no findings suspicious for malignancy. IMPRESSION: No mammographic evidence of malignancy. A result letter of this screening mammogram will be mailed directly to the patient. RECOMMENDATION: Screening mammogram in one year. (Code:SM-B-01Y) BI-RADS CATEGORY  1: Negative. Electronically Signed   By: Ammie Ferrier M.D.   On: 05/27/2021 11:59     ELIGIBLE FOR AVAILABLE RESEARCH PROTOCOL: no   ASSESSMENT: 60 y.o. [place] woman presenting June 2021 with severe back pain, thoracic MRI 02/22/2020 suggestive of possible myeloma.  Work-up included:  (a) 03/05/2020 Myeloma panel shows no M protein but all immunoglobulins are decreased.  The lambda ratio is markedly abnormal at 131.34; review of the blood film is not diagnostic  (b) 24-hour urine: markedly elevated Kappa light chains at 9,656; urine kappa/lambda ratio 4180.24, and urine M spike of 239.  (c) bone survey on 03/16/2020 shows rounded lucencies throughout the skull which could be venous lakes  (d) bone marrow biopsy on 7/27 confirms multiple myeloma with 44% plasma cells on the aspirate by manual differential, and 70 to 80% on the biopsy by CD138 immunohistochemistry  (e) cytogenetics  03/24/2020: no metaphase cells available for analysis  (f) myeloma FISH  studies 03/24/2020 show  t(11,14), with NO del(17p), t(1/14) or t(14.16)  (g) on 04/08/2020 LDH 240, beta-2-microglobulin 4.4, albumin 4.2  (h) R-ISS Stage: II   (1) bortezomib/dexamethasone started 03/31/2020, most recent dose 10/28/2020  (a) lenalidomide added on 04/21/2020, stopped on 8/26 after two doses secondary to rash, resumed 04/29/2020 again with a very poor tolerance, treated with Medrol Dosepak, third attempt 05/14/2020, again unable to tolerate   (b) thalidomide added 06/04/2020 to bortezomib/ dexamethasone 05/27/2020, discontinued 06/24/2020 with rash  (c) daratumumab added 07/08/2020, most recent dose 10/21/2020   (2) denosumab/Xgeva started 05/06/2020, repeated every 28 days  (3) supportive therapy:  (a) valacyclovir 1g po BID  (b) ASA 325 mg daily  (c) Tixagevimab183m/Cilgavimab150mg x2, last dose 10/28/2020  (4) [facility] ([facility]) s/p high-dose [facility] (a) stem cell reinfusion 11/24/2020  (b) white cell engraftment 12/05/2020, platelet engraftment 12/11/2020  (c) Day +100 bone marrow evaluation normocellular with no increase in plasma cells, however MRD positive.   (d) PET 03/09/2021 showed no FDG-avid bone or other disease; increased tracer uptake in left atrial wall led to cardiac consultation   (i) echo 07/ 08 /2022 showed an ejection fraction in the 20-25% range; global LV strain 6%   (ii) repeat echo on 05/27/2021 shows ejection fraction 20-25%, strain not measured--overall no significant change compared to July  (6) maintenance bortezomib indicated  (a) start of mintenance therapy delayed by post-transplant CHF       PLAN: JBrieanahas vigorous marrow function which is very favorable.  Despite that she understands her immune system is not going to work normally for some time and she will receive passive immunity for SARS-CoV-2 with Evusheld this month.  She should receive  her COVID vaccines records in addition to that.  She will resume denosumab/Xgeva later this week.  That will be repeated monthly.  We are ready to start maintenance once she has seen Dr. LAris Lotand that he decides whether he indeed wants this to go ahead with Velcade or wants to try something different recalling of course that she was unable to tolerate lenalidomide or thalidomide.  I suggested she wait a little before making any definitive decisions regarding work or retirement since her heart might indeed improve with a little bit more time.  She does need mammography sometime this year and she would like Dr. BCristina Gongbefore he retires to repeat a colonoscopy.  She will contact his office regarding that.  Otherwise she will return to see me June 03, 2021 and we will likely start her maintenance treatments at that time  Total encounter time 30 minutes.   GVirgie Dad Bich Mchaney, MD 06/16/21 12:25 PM Medical Oncology [facility] H[facility] [place] [id number]. [phone]   Fax. [phone]  I, KWilburn Mylar am acting as scribe for Dr. GVirgie Dad Rylynne Schicker.  I, GLurline DelMD, have reviewed the above documentation for accuracy and completeness, and I agree with the above.   *Total Encounter Time as defined by the Centers for Medicare and Medicaid Services includes, in addition to the face-to-face time of a patient visit (documented in the note above) non-face-to-face time: obtaining and reviewing outside history, ordering and reviewing medications, tests or procedures, care coordination (communications with other health care professionals or caregivers) and documentation in the medical record.

## 2021-06-16 NOTE — Progress Notes (Addendum)
[place]  [phone] [phone]    ID: Robin Wilson DOB: 03-28-1961  MR#: [id number]  UMP#:[id number]  Patient Care Team: Maurice Small, MD (Inactive) as PCP - General (Family Medicine) Magrinat, Virgie Dad, MD as Consulting Physician (Oncology) Latanya Maudlin, MD as Consulting Physician (Orthopedic Surgery) Everlene Farrier, MD as Consulting Physician (Obstetrics and Gynecology) Domingo Pulse, MD (Urology) Ronald Lobo, MD as Consulting Physician (Gastroenterology) Markus Daft, MD as Consulting Physician (Interventional Radiology) Erline Levine, MD as Consulting Physician (Neurosurgery) Aris Lot Ramon Dredge, MD as Referring Physician (Hematology and Oncology) Milda Smart, MD as Referring Physician (Diagnostic Radiology) OTHER MD: Algis Liming. Lambird MD]  CHIEF COMPLAINT: Light chain myeloma  CURRENT TREATMENT: resume denomab/Xgeva; Bortezomib maintenance  INTERVAL HISTORY: Robin Wilson returns today for f/u of her multiple myeloma.  She is s/p autologous stem cell transplant in 11/2020.  She tolerated this well and recovered and felt well.  In August she developed fatigue and shortness of breath that was progressive.  Her echo pre transplant showed an EF of 65% and post was 20%.  She has been working with cardiology on medication management.  2 weeks ago her BP was very low, however her Delene Loll was adjusted and her BP is good today. She noted she has also had some difficulty with allergies and is taking a slow prednisone taper.  She is awaiting a pulmonary evaluation to get on long term inhalers.    Robin Wilson is working and has been for the past 3 months.  She is working remotely and walks a mile a day.  REVIEW OF SYSTEMS: Review of Systems  Constitutional:  Positive for fatigue. Negative for appetite change, chills, fever and unexpected weight change.  HENT:   Negative for hearing loss, lump/mass and trouble swallowing.   Eyes:  Negative for eye  problems and icterus.  Respiratory:  Negative for chest tightness, cough and shortness of breath.   Cardiovascular:  Negative for chest pain, leg swelling and palpitations.  Gastrointestinal:  Negative for abdominal distention, abdominal pain, constipation, diarrhea, nausea and vomiting.  Endocrine: Negative for hot flashes.  Genitourinary:  Negative for difficulty urinating.   Musculoskeletal:  Negative for arthralgias.  Skin:  Negative for itching and rash.  Neurological:  Negative for dizziness, extremity weakness, headaches and numbness.  Hematological:  Negative for adenopathy. Does not bruise/bleed easily.  Psychiatric/Behavioral:  Negative for depression. The patient is not nervous/anxious.       COVID 19 VACCINATION STATUS: Status post [employer] x2, waiting on the booster; received Evusheld approximately 6 months ago   HISTORY OF CURRENT ILLNESS: From the original intake note:  SARAHY CREEDON is a 60 y.o. woman that presented with low and thoracic back pain to Dr. Latanya Maudlin. She notes muscle spasms and a low grade fever of 100 degrees.   Labs completed on 02/19/2020 revealed a CBC that was within normal limits except for RBC 2.39, Hgb 8.8, HCT 26.3, MCV 110.0, MCH 36.8, RDW 17.7, AMC 91. CMP was within normal limits except for potassium 3.3, CO2 35. Uric Acid was within normal limits. Urine Protein was abnormal at 57. C-reactive protein was within normal limits. ANA screening was negative.   Thorasic Spine MRI on 02/22/2020 revealed diffuse marrow T1 hypointensity involving vertibral bodies and some spinous processes associated with mild STTR hyperintensity with mild wedge compression fracture of T7 and mild right superior endplate compression fracture of T11 that are suspicious for pathologic fractures. There is mild central zone narrowing at mid  T7 with minimal-deformity of the cord. Differential considerationsinclude Lymphoma, multiple myeloma, and diffuse metastatic disease.  There is partially visualized mild-moderate spinal canal stenosis and mild deformity of the cord at C5-6 and C6-7 probable moderate-severe to severe-neutral foraminal narrowing which would be better assessed dedicated MRI of the cervical spine as clinically warranted.   Lab Results  Component Value Date   TOTALPROTELP 6.1 03/05/2020    Lab Results  Component Value Date   KPAFRELGTCHN 919.4 (H) 03/05/2020   LAMBDASER 7.0 03/05/2020   KAPLAMBRATIO 131.34 (H) 03/05/2020   Of note she had a CT of the abdomen on 01/03/2017 at [facility] which showed a well-defined sclerotic lesion in the left iliac bone consistent with a bone island and no lytic or sclerotic lesions anywhere else.   PAST MEDICAL HISTORY: Past Medical History:  Diagnosis Date   Hypertension    Kidney stones    Kidney stones     PAST SURGICAL HISTORY: Past Surgical History:  Procedure Laterality Date   CESAREAN SECTION     CYSTOSCOPY WITH RETROGRADE PYELOGRAM, URETEROSCOPY AND STENT PLACEMENT Right 09/16/2015   Procedure: CYSTOSCOPY WITH RETROGRADE PYELOGRAM, URETEROSCOPY AND STENT PLACEMENT WITH HOLMIUM LASER ABLATION ;  Surgeon: Alexis Frock, MD;  Location: [facility];  Service: Urology;  Laterality: Right;   CYSTOSCOPY/RETROGRADE/URETEROSCOPY  06/04/2012   Procedure: CYSTOSCOPY/RETROGRADE/URETEROSCOPY;  Surgeon: Ailene Rud, MD;  Location: [facility];  Service: Urology;  Laterality: Left;   IR RADIOLOGIST EVAL & MGMT  04/29/2020   UTERINE FIBROID EMBOLIZATION      FAMILY HISTORY: Family History  Problem Relation Age of Onset   Breast cancer Neg Hx    The patient has 1 brother, 2 sisters.  Both her parents died in their 34s.  There is no cancer history in the family to her knowledge   GYNECOLOGIC HISTORY:  No LMP recorded. Patient has had an ablation. 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  Her 2 sons are from her first marriage, [name] who is a Administrator, arts at [employer] who graduated from [employer] in Merchandiser, retail.  In 2016 she married her second husband, [name].  He is a Loss adjuster, chartered for [employer].  He has 3 children of his own living in [place] and [place].    The patient attends [employer] DIRECTIVES: In the absence of any documents to the contrary the patient's husband is her healthcare power of attorney   HEALTH MAINTENANCE: Social History   Tobacco Use   Smoking status: Never   Smokeless tobacco: Never  Substance Use Topics   Alcohol use: No   Drug use: No    Colonoscopy: Due  PAP: Up-to-date  Bone density: Osteopenia Mammography: Up-to-date  Allergies  Allergen Reactions   Ace Inhibitors Other (See Comments)   Other Other (See Comments)   Sulfa Drugs Cross Reactors     From childhood per pt's mother , pt is unaware of allergy status    Current Outpatient Medications  Medication Sig Dispense Refill   albuterol (VENTOLIN HFA) 108 (90 Base) MCG/ACT inhaler Inhale 2 puffs into the lungs every 4 (four) hours as needed.     ENTRESTO 24-26 MG Take 1 tablet by mouth 2 (two) times daily.     JARDIANCE 10  MG TABS tablet Take 10 mg by mouth daily.     predniSONE (DELTASONE) 10 MG tablet Take 40 mg by mouth daily. Slow taper     traZODone (DESYREL) 50 MG tablet Take 50 mg by mouth at bedtime as needed.     acyclovir (ZOVIRAX) 800 MG tablet Take 1 tablet (800 mg total) by mouth 2 (two) times daily. 60 tablet 12   calcium carbonate (OS-CAL - DOSED IN MG OF ELEMENTAL CALCIUM) 1250 MG tablet Take 1 tablet by mouth daily.       clobetasol cream (TEMOVATE) 0.05 %      clonazePAM (KLONOPIN) 0.5 MG tablet Take 0.5 mg by mouth 2 (two) times daily as needed.     folic acid (FOLVITE) 1 MG tablet Take 1 mg by mouth  daily.     hydrocortisone 2.5 % ointment Apply to the eczema rash on the face once or twice daily as needed.     hydrOXYzine (ATARAX/VISTARIL) 10 MG tablet Take 1 tablet (10 mg total) by mouth every 4 (four) hours as needed for anxiety. 30 tablet 0   metoprolol succinate (TOPROL-XL) 25 MG 24 hr tablet Take 12.5 mg by mouth daily.     Multiple Vitamins-Minerals (MULTIVITAMIN PO) Take 1 tablet by mouth daily.      prochlorperazine (COMPAZINE) 5 MG tablet Take 2 tablets (10 mg total) by mouth every 6 (six) hours as needed for nausea or vomiting. 30 tablet 1   sertraline (ZOLOFT) 50 MG tablet Take 75 mg by mouth daily.     spironolactone (ALDACTONE) 25 MG tablet Take 25 mg by mouth daily.     No current facility-administered medications for this visit.    OBJECTIVE:  Vitals:   06/17/21 0821  BP: 128/82  Pulse: 60  Resp: 18  Temp: (!) 97.4 F (36.3 C)  SpO2: 100%   Wt Readings from Last 3 Encounters:  06/17/21 127 lb 12.8 oz (58 kg)  04/19/21 121 lb 9.6 oz (55.2 kg)  01/19/21 132 lb 4.8 oz (60 kg)   Body mass index is 18.87 kg/m.    ECOG FS:2 - Symptomatic, <50% confined to bed  GENERAL: Patient is a well appearing female in no acute distress HEENT:  Sclerae anicteric.  Mask in place.  Neck is supple.  NODES:  No cervical, supraclavicular, or axillary lymphadenopathy palpated.  BREAST EXAM:  Deferred. LUNGS:  Clear to auscultation bilaterally.  No wheezes or rhonchi. HEART:  Regular rate and rhythm. No murmur appreciated. ABDOMEN:  Soft, nontender.  Positive, normoactive bowel sounds. No organomegaly palpated. MSK:  No focal spinal tenderness to palpation. Full range of motion bilaterally in the upper extremities. EXTREMITIES:  No peripheral edema.   SKIN:  Clear with no obvious rashes or skin changes. No nail dyscrasia. NEURO:  Nonfocal. Well oriented.  Appropriate affect.   LAB RESULTS:  CMP     Component Value Date/Time   NA 142 06/17/2021 0755   K 3.7 06/17/2021 0755    CL 106 06/17/2021 0755   CO2 25 06/17/2021 0755   GLUCOSE 76 06/17/2021 0755   BUN 18 06/17/2021 0755   CREATININE 0.77 06/17/2021 0755   CALCIUM 9.5 06/17/2021 0755   PROT 6.8 06/17/2021 0755   ALBUMIN 4.3 06/17/2021 0755   AST 14 (L) 06/17/2021 0755   ALT 16 06/17/2021 0755   ALKPHOS 74 06/17/2021 0755   BILITOT 0.3 06/17/2021 0755   GFRNONAA >60 06/17/2021 0755   GFRAA >60 05/27/2020 1428    Lab Results  Component Value Date   TOTALPROTELP 5.6 (L) 01/19/2021   ALBUMINELP 3.6 10/21/2020   A1GS 0.2 10/21/2020   A2GS 0.7 10/21/2020   BETS 0.9 10/21/2020   GAMS 0.4 10/21/2020   MSPIKE 0.2 (H) 10/21/2020   SPEI Comment 10/21/2020     Lab Results  Component Value Date   KPAFRELGTCHN 6.0 04/19/2021   LAMBDASER <1.5 (L) 04/19/2021   KAPLAMBRATIO >4.00 (H) 04/19/2021    Lab Results  Component Value Date   WBC 15.4 (H) 06/17/2021   NEUTROABS 11.1 (H) 06/17/2021   HGB 12.8 06/17/2021   HCT 37.8 06/17/2021   MCV 106.2 (H) 06/17/2021   PLT 279 06/17/2021    No results found for: LABCA2  No components found for: ZGYFVC944  No results for input(s): INR in the last 168 hours.  No results found for: LABCA2  No results found for: HQP591  No results found for: MBW466  No results found for: ZLD357  No results found for: CA2729  No components found for: HGQUANT  No results found for: CEA1 / No results found for: CEA1   No results found for: AFPTUMOR  No results found for: CHROMOGRNA  No results found for: HGBA, HGBA2QUANT, HGBFQUANT, HGBSQUAN (Hemoglobinopathy evaluation)   Lab Results  Component Value Date   LDH 240 (H) 04/08/2020    No results found for: IRON, TIBC, IRONPCTSAT (Iron and TIBC)  No results found for: FERRITIN  Urinalysis    Component Value Date/Time   COLORURINE YELLOW 09/15/2015 2315   APPEARANCEUR CLOUDY (A) 09/15/2015 2315   LABSPEC 1.014 09/15/2015 2315   PHURINE 8.5 (H) 09/15/2015 2315   GLUCOSEU NEGATIVE 09/15/2015 2315    HGBUR SMALL (A) 09/15/2015 2315   BILIRUBINUR NEGATIVE 09/15/2015 2315   KETONESUR 15 (A) 09/15/2015 2315   PROTEINUR NEGATIVE 09/15/2015 2315   UROBILINOGEN 1.0 05/24/2013 1913   NITRITE NEGATIVE 09/15/2015 2315   LEUKOCYTESUR TRACE (A) 09/15/2015 2315    STUDIES:  MM 3D SCREEN BREAST BILATERAL  Result Date: 05/27/2021 CLINICAL DATA:  Screening. EXAM: DIGITAL SCREENING BILATERAL MAMMOGRAM WITH TOMOSYNTHESIS AND CAD TECHNIQUE: Bilateral screening digital craniocaudal and mediolateral oblique mammograms were obtained. Bilateral screening digital breast tomosynthesis was performed. The images were evaluated with computer-aided detection. COMPARISON:  Previous exam(s). ACR Breast Density Category b: There are scattered areas of fibroglandular density. FINDINGS: There are no findings suspicious for malignancy. IMPRESSION: No mammographic evidence of malignancy. A result letter of this screening mammogram will be mailed directly to the patient. RECOMMENDATION: Screening mammogram in one year. (Code:SM-B-01Y) BI-RADS CATEGORY  1: Negative. Electronically Signed   By: Ammie Ferrier M.D.   On: 05/27/2021 11:59     ELIGIBLE FOR AVAILABLE RESEARCH PROTOCOL: no   ASSESSMENT: 60 y.o. [place] woman presenting June 2021 with severe back pain, thoracic MRI 02/22/2020 suggestive of possible myeloma.  Work-up included:  (a) 03/05/2020 Myeloma panel shows no M protein but all immunoglobulins are decreased.  The lambda ratio is markedly abnormal at 131.34; review of the blood film is not diagnostic  (b) 24-hour urine: markedly elevated Kappa light chains at 9,656; urine kappa/lambda ratio 4180.24, and urine M spike of 239.  (c) bone survey on 03/16/2020 shows rounded lucencies throughout the skull which could be venous lakes  (d) bone marrow biopsy on 7/27 confirms multiple myeloma with 44% plasma cells on the aspirate by manual differential, and 70 to 80% on the biopsy by CD138  immunohistochemistry  (e) cytogenetics  03/24/2020: no metaphase cells available for analysis  (  f) myeloma FISH studies 03/24/2020 show t(11,14), with NO del(17p), t(1/14) or t(14.16)  (g) on 04/08/2020 LDH 240, beta-2-microglobulin 4.4, albumin 4.2  (h) R-ISS Stage: II   (1) bortezomib/dexamethasone started 03/31/2020, most recent dose 10/28/2020  (a) lenalidomide added on 04/21/2020, stopped on 8/26 after two doses secondary to rash, resumed 04/29/2020 again with a very poor tolerance, treated with Medrol Dosepak, third attempt 05/14/2020, again unable to tolerate   (b) thalidomide added 06/04/2020 to bortezomib/ dexamethasone 05/27/2020, discontinued 06/24/2020 with rash  (c) daratumumab added 07/08/2020, most recent dose 10/21/2020   (2) denosumab/Xgeva started 05/06/2020, repeated every 28 days  (3) supportive therapy:  (a) valacyclovir 1g po BID  (b) ASA 325 mg daily  (c) Tixagevimab160m/Cilgavimab150mg x2, last dose 10/28/2020  (4) [facility] (5) s/p high-dose therapy with [facility] (a) stem cell reinfusion 11/24/2020  (b) white cell engraftment 12/05/2020, platelet engraftment 12/11/2020  (c) Day +100 bone marrow evaluation normocellular with no increase in plasma cells, however MRD positive.   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 VBeau Fanny PLAN: Robin Wilson here today for f/u and maintenance treatment of her multiple myeloma.  She met with myself and Dr. MJana Hakimtoday.  She has had some difficulty with heart failure following her autologous stem cell transplant, however her functionality is improved  and she is walking and working full time.    Robin Wilson's wbc is slightly elevated at 15.4 today.  I suspect that is due to her high dose steroid taper as she has no signs of infection.  She will proceed with xgeva and bortezomib today.    JJeriwill return every 2 weeks for labs and bortezomib and we discussed Dr. MJana Hakimretiring and she will f/u with Dr. DLorenso Courierin 4 weeks.    I will talk with Dr. RChase Callertonight about her asthma and when he could evaluate her in clinic so that she can get on some better inhalers.     Total encounter time 30 minutes.  LWilber Bihari NP 06/17/21 9:10 AM Medical Oncology and H[facility]ology [facility] [place] [id number]. [phone]   Fax. [phone]  ADDENDUM: Robin Wilson appropriately concerned about her continuing cardiomyopathy.  It may be that this will be permanent.  Clinically she is tolerating it well and she has a very good functional status.  We are ready to start maintenance bortezomib.  She has a good understanding of the possible toxicities side effects and complications of this agent.  She understands that I will be retiring later this year and [facility] but she tells me she finds our office easier to deal with and she will be followed by my partners after I leave, continuing [facility] personally saw this patient and performed a substantive portion of this encounter with the listed APP documented above.   GChauncey Cruel MD Medical Oncology and Hematology [facility] [address]Tel. [phone]   Fax. [phone]    *  Total Encounter Time as defined by the Centers for Medicare and Medicaid Services includes, in addition to the face-to-face time of a patient visit (documented in the note above) non-face-to-face time: obtaining and reviewing outside history, ordering and  reviewing medications, tests or procedures, care coordination (communications with other health care professionals or caregivers) and documentation in the medical record.

## 2021-06-17 ENCOUNTER — Inpatient Hospital Stay: Payer: 59

## 2021-06-17 ENCOUNTER — Encounter: Payer: Self-pay | Admitting: Adult Health

## 2021-06-17 ENCOUNTER — Other Ambulatory Visit: Payer: Self-pay

## 2021-06-17 ENCOUNTER — Inpatient Hospital Stay: Payer: 59 | Attending: Oncology | Admitting: Adult Health

## 2021-06-17 ENCOUNTER — Other Ambulatory Visit: Payer: Self-pay | Admitting: Oncology

## 2021-06-17 VITALS — BP 128/82 | HR 60 | Temp 97.4°F | Resp 18 | Ht 69.0 in | Wt 127.8 lb

## 2021-06-17 DIAGNOSIS — C9000 Multiple myeloma not having achieved remission: Secondary | ICD-10-CM | POA: Insufficient documentation

## 2021-06-17 DIAGNOSIS — Z79899 Other long term (current) drug therapy: Secondary | ICD-10-CM | POA: Insufficient documentation

## 2021-06-17 DIAGNOSIS — Z5112 Encounter for antineoplastic immunotherapy: Secondary | ICD-10-CM | POA: Insufficient documentation

## 2021-06-17 DIAGNOSIS — Z7189 Other specified counseling: Secondary | ICD-10-CM

## 2021-06-17 DIAGNOSIS — I5022 Chronic systolic (congestive) heart failure: Secondary | ICD-10-CM

## 2021-06-17 DIAGNOSIS — J4540 Moderate persistent asthma, uncomplicated: Secondary | ICD-10-CM | POA: Diagnosis not present

## 2021-06-17 LAB — CBC WITH DIFFERENTIAL (CANCER CENTER ONLY)
Abs Immature Granulocytes: 0.07 10*3/uL (ref 0.00–0.07)
Basophils Absolute: 0.1 10*3/uL (ref 0.0–0.1)
Basophils Relative: 0 %
Eosinophils Absolute: 0 10*3/uL (ref 0.0–0.5)
Eosinophils Relative: 0 %
HCT: 37.8 % (ref 36.0–46.0)
Hemoglobin: 12.8 g/dL (ref 12.0–15.0)
Immature Granulocytes: 1 %
Lymphocytes Relative: 20 %
Lymphs Abs: 3 10*3/uL (ref 0.7–4.0)
MCH: 36 pg — ABNORMAL HIGH (ref 26.0–34.0)
MCHC: 33.9 g/dL (ref 30.0–36.0)
MCV: 106.2 fL — ABNORMAL HIGH (ref 80.0–100.0)
Monocytes Absolute: 1.1 10*3/uL — ABNORMAL HIGH (ref 0.1–1.0)
Monocytes Relative: 7 %
Neutro Abs: 11.1 10*3/uL — ABNORMAL HIGH (ref 1.7–7.7)
Neutrophils Relative %: 72 %
Platelet Count: 279 10*3/uL (ref 150–400)
RBC: 3.56 MIL/uL — ABNORMAL LOW (ref 3.87–5.11)
RDW: 11.5 % (ref 11.5–15.5)
WBC Count: 15.4 10*3/uL — ABNORMAL HIGH (ref 4.0–10.5)
nRBC: 0 % (ref 0.0–0.2)

## 2021-06-17 LAB — CMP (CANCER CENTER ONLY)
ALT: 16 U/L (ref 0–44)
AST: 14 U/L — ABNORMAL LOW (ref 15–41)
Albumin: 4.3 g/dL (ref 3.5–5.0)
Alkaline Phosphatase: 74 U/L (ref 38–126)
Anion gap: 11 (ref 5–15)
BUN: 18 mg/dL (ref 6–20)
CO2: 25 mmol/L (ref 22–32)
Calcium: 9.5 mg/dL (ref 8.9–10.3)
Chloride: 106 mmol/L (ref 98–111)
Creatinine: 0.77 mg/dL (ref 0.44–1.00)
GFR, Estimated: >60 mL/min (ref >60)
Glucose, Bld: 76 mg/dL (ref 70–99)
Potassium: 3.7 mmol/L (ref 3.5–5.1)
Sodium: 142 mmol/L (ref 135–145)
Total Bilirubin: 0.3 mg/dL (ref 0.3–1.2)
Total Protein: 6.8 g/dL (ref 6.5–8.1)

## 2021-06-17 LAB — RETICULOCYTES
Immature Retic Fract: 10.5 % (ref 2.3–15.9)
RBC.: 3.56 MIL/uL — ABNORMAL LOW (ref 3.87–5.11)
Retic Count, Absolute: 104.3 10*3/uL (ref 19.0–186.0)
Retic Ct Pct: 2.9 % (ref 0.4–3.1)

## 2021-06-17 LAB — PROTEIN / CREATININE RATIO, URINE
Creatinine, Urine: 192.02 mg/dL
Protein Creatinine Ratio: 0.08 mg/mg{Cre} (ref 0.00–0.15)
Total Protein, Urine: 15 mg/dL

## 2021-06-17 MED ORDER — PROCHLORPERAZINE MALEATE 10 MG PO TABS
10.0000 mg | ORAL_TABLET | Freq: Once | ORAL | Status: AC
  Administered 2021-06-17: 10 mg via ORAL
  Filled 2021-06-17: qty 1

## 2021-06-17 MED ORDER — DENOSUMAB 120 MG/1.7ML ~~LOC~~ SOLN: 120.0000 mg | Freq: Once | SUBCUTANEOUS | Status: DC

## 2021-06-17 MED ORDER — BORTEZOMIB CHEMO SQ INJECTION 3.5 MG (2.5MG/ML)
1.0000 mg/m2 | Freq: Once | INTRAMUSCULAR | Status: AC
  Administered 2021-06-17: 1.75 mg via SUBCUTANEOUS
  Filled 2021-06-17: qty 0.7

## 2021-06-17 NOTE — Patient Instructions (Signed)
[facility]  Discharge Instructions: Thank you for choosing [facility] to provide your oncology and hematology care.   If you have a lab appointment with the [facility], please go directly to the [facility] and check in at the registration area.   Wear comfortable clothing and clothing appropriate for easy access to any Portacath or PICC line.   We strive to give you quality time with your provider. You may need to reschedule your appointment if you arrive late (15 or more minutes).  Arriving late affects you and other patients whose appointments are after yours.  Also, if you miss three or more appointments without notifying the office, you may be dismissed from the clinic at the provider's discretion.      For prescription refill requests, have your pharmacy contact our office and allow 72 hours for refills to be completed.    Today you received the following chemotherapy and/or immunotherapy agents: Velcade      To help prevent nausea and vomiting after your treatment, we encourage you to take your nausea medication as directed.  BELOW ARE SYMPTOMS THAT SHOULD BE REPORTED IMMEDIATELY: *FEVER GREATER THAN 100.4 F (38 C) OR HIGHER *CHILLS OR SWEATING *NAUSEA AND VOMITING THAT IS NOT CONTROLLED WITH YOUR NAUSEA MEDICATION *UNUSUAL SHORTNESS OF BREATH *UNUSUAL BRUISING OR BLEEDING *URINARY PROBLEMS (pain or burning when urinating, or frequent urination) *BOWEL PROBLEMS (unusual diarrhea, constipation, pain near the anus) TENDERNESS IN MOUTH AND THROAT WITH OR WITHOUT PRESENCE OF ULCERS (sore throat, sores in mouth, or a toothache) UNUSUAL RASH, SWELLING OR PAIN  UNUSUAL VAGINAL DISCHARGE OR ITCHING   Items with * indicate a potential emergency and should be followed up as soon as possible or go to the Emergency Department if any problems should occur.  Please show the CHEMOTHERAPY ALERT CARD or IMMUNOTHERAPY ALERT CARD at check-in to  the Emergency Department and triage nurse.  Should you have questions after your visit or need to cancel or reschedule your appointment, please contact [facility]  Dept: [phone]  and follow the prompts.  Office hours are 8:00 a.m. to 4:30 p.m. Monday - Friday. Please note that voicemails left after 4:00 p.m. may not be returned until the following business day.  We are closed weekends and major holidays. You have access to a nurse at all times for urgent questions. Please call the main number to the clinic Dept: [phone] and follow the prompts.   For any non-urgent questions, you may also contact your provider using MyChart. We now offer e-Visits for anyone 33 and older to request care online for non-urgent symptoms. For details visit [email or web address].   Also download the MyChart app! Go to the app store, search "MyChart", open the app, select [facility], and log in with your MyChart username and password.  Due to Covid, a mask is required upon entering the hospital/clinic. If you do not have a mask, one will be given to you upon arrival. For doctor visits, patients may have 1 support person aged 6 or older with them. For treatment visits, patients cannot have anyone with them due to current Covid guidelines and our immunocompromised population.   Denosumab injection What is this medication? DENOSUMAB (den oh sue mab) slows bone breakdown. Prolia is used to treat osteoporosis in women after menopause and in men, and in people who are taking corticosteroids for 6 months or more. Delton See is used to treat a high calcium level due  to cancer and to prevent bone fractures and other bone problems caused by multiple myeloma or cancer bone metastases. Xgeva is also used to treat giant cell tumor of the bone. This medicine may be used for other purposes; ask your health care provider or pharmacist if you have questions. COMMON BRAND NAME(S): Prolia, XGEVA What  should I tell my care team before I take this medication? They need to know if you have any of these conditions: dental disease having surgery or tooth extraction infection kidney disease low levels of calcium or Vitamin D in the blood malnutrition on hemodialysis skin conditions or sensitivity thyroid or parathyroid disease an unusual reaction to denosumab, other medicines, foods, dyes, or preservatives pregnant or trying to get pregnant breast-feeding How should I use this medication? This medicine is for injection under the skin. It is given by a health care professional in a hospital or clinic setting. A special MedGuide will be given to you before each treatment. Be sure to read this information carefully each time. For Prolia, talk to your pediatrician regarding the use of this medicine in children. Special care may be needed. For Xgeva, talk to your pediatrician regarding the use of this medicine in children. While this drug may be prescribed for children as young as 13 years for selected conditions, precautions do apply. Overdosage: If you think you have taken too much of this medicine contact a poison control center or emergency room at once. NOTE: This medicine is only for you. Do not share this medicine with others. What if I miss a dose? It is important not to miss your dose. Call your doctor or health care professional if you are unable to keep an appointment. What may interact with this medication? Do not take this medicine with any of the following medications: other medicines containing denosumab This medicine may also interact with the following medications: medicines that lower your chance of fighting infection steroid medicines like prednisone or cortisone This list may not describe all possible interactions. Give your health care provider a list of all the medicines, herbs, non-prescription drugs, or dietary supplements you use. Also tell them if you smoke, drink  alcohol, or use illegal drugs. Some items may interact with your medicine. What should I watch for while using this medication? Visit your doctor or health care professional for regular checks on your progress. Your doctor or health care professional may order blood tests and other tests to see how you are doing. Call your doctor or health care professional for advice if you get a fever, chills or sore throat, or other symptoms of a cold or flu. Do not treat yourself. This drug may decrease your body's ability to fight infection. Try to avoid being around people who are sick. You should make sure you get enough calcium and vitamin D while you are taking this medicine, unless your doctor tells you not to. Discuss the foods you eat and the vitamins you take with your health care professional. See your dentist regularly. Brush and floss your teeth as directed. Before you have any dental work done, tell your dentist you are receiving this medicine. Do not become pregnant while taking this medicine or for 5 months after stopping it. Talk with your doctor or health care professional about your birth control options while taking this medicine. Women should inform their doctor if they wish to become pregnant or think they might be pregnant. There is a potential for serious side effects to an unborn child.   Talk to your health care professional or pharmacist for more information. What side effects may I notice from receiving this medication? Side effects that you should report to your doctor or health care professional as soon as possible: allergic reactions like skin rash, itching or hives, swelling of the face, lips, or tongue bone pain breathing problems dizziness jaw pain, especially after dental work redness, blistering, peeling of the skin signs and symptoms of infection like fever or chills; cough; sore throat; pain or trouble passing urine signs of low calcium like fast heartbeat, muscle cramps or  muscle pain; pain, tingling, numbness in the hands or feet; seizures unusual bleeding or bruising unusually weak or tired Side effects that usually do not require medical attention (report to your doctor or health care professional if they continue or are bothersome): constipation diarrhea headache joint pain loss of appetite muscle pain runny nose tiredness upset stomach This list may not describe all possible side effects. Call your doctor for medical advice about side effects. You may report side effects to FDA at 1-800-FDA-1088. Where should I keep my medication? This medicine is only given in a clinic, doctor's office, or other health care setting and will not be stored at home. NOTE: This sheet is a summary. It may not cover all possible information. If you have questions about this medicine, talk to your doctor, pharmacist, or health care provider.  2022 Elsevier/Gold Standard (2017-12-22 16:10:44)

## 2021-06-18 ENCOUNTER — Encounter: Payer: Self-pay | Admitting: Oncology

## 2021-06-18 LAB — KAPPA/LAMBDA LIGHT CHAINS
Kappa free light chain: 6.2 mg/L (ref 3.3–19.4)
Kappa, lambda light chain ratio: 2.38 — ABNORMAL HIGH (ref 0.26–1.65)
Lambda free light chains: 2.6 mg/L — ABNORMAL LOW (ref 5.7–26.3)

## 2021-06-21 LAB — MULTIPLE MYELOMA PANEL, SERUM
Albumin SerPl Elph-Mcnc: 3.8 g/dL (ref 2.9–4.4)
Albumin/Glob SerPl: 1.5 (ref 0.7–1.7)
Alpha 1: 0.3 g/dL (ref 0.0–0.4)
Alpha2 Glob SerPl Elph-Mcnc: 0.8 g/dL (ref 0.4–1.0)
B-Globulin SerPl Elph-Mcnc: 1 g/dL (ref 0.7–1.3)
Gamma Glob SerPl Elph-Mcnc: 0.5 g/dL (ref 0.4–1.8)
Globulin, Total: 2.6 g/dL (ref 2.2–3.9)
IgA: 12 mg/dL — ABNORMAL LOW (ref 87–352)
IgG (Immunoglobin G), Serum: 536 mg/dL — ABNORMAL LOW (ref 586–1602)
IgM (Immunoglobulin M), Srm: 18 mg/dL — ABNORMAL LOW (ref 26–217)
Total Protein ELP: 6.4 g/dL (ref 6.0–8.5)

## 2021-06-22 ENCOUNTER — Encounter: Payer: Self-pay | Admitting: Oncology

## 2021-06-30 ENCOUNTER — Other Ambulatory Visit: Payer: Self-pay | Admitting: *Deleted

## 2021-06-30 ENCOUNTER — Inpatient Hospital Stay: Payer: 59 | Attending: Oncology

## 2021-06-30 ENCOUNTER — Other Ambulatory Visit: Payer: Self-pay

## 2021-06-30 ENCOUNTER — Inpatient Hospital Stay: Payer: 59

## 2021-06-30 VITALS — BP 128/86 | HR 67 | Temp 98.1°F | Resp 16 | Wt 128.5 lb

## 2021-06-30 DIAGNOSIS — C9000 Multiple myeloma not having achieved remission: Secondary | ICD-10-CM | POA: Diagnosis present

## 2021-06-30 DIAGNOSIS — Z79899 Other long term (current) drug therapy: Secondary | ICD-10-CM | POA: Insufficient documentation

## 2021-06-30 DIAGNOSIS — Z5112 Encounter for antineoplastic immunotherapy: Secondary | ICD-10-CM | POA: Insufficient documentation

## 2021-06-30 LAB — CMP (CANCER CENTER ONLY)
ALT: 20 U/L (ref 0–44)
AST: 14 U/L — ABNORMAL LOW (ref 15–41)
Albumin: 4.2 g/dL (ref 3.5–5.0)
Alkaline Phosphatase: 70 U/L (ref 38–126)
Anion gap: 10 (ref 5–15)
BUN: 23 mg/dL — ABNORMAL HIGH (ref 6–20)
CO2: 28 mmol/L (ref 22–32)
Calcium: 9.8 mg/dL (ref 8.9–10.3)
Chloride: 103 mmol/L (ref 98–111)
Creatinine: 0.81 mg/dL (ref 0.44–1.00)
GFR, Estimated: >60 mL/min (ref >60)
Glucose, Bld: 119 mg/dL — ABNORMAL HIGH (ref 70–99)
Potassium: 5 mmol/L (ref 3.5–5.1)
Sodium: 141 mmol/L (ref 135–145)
Total Bilirubin: 0.3 mg/dL (ref 0.3–1.2)
Total Protein: 6.5 g/dL (ref 6.5–8.1)

## 2021-06-30 LAB — CBC WITH DIFFERENTIAL (CANCER CENTER ONLY)
Abs Immature Granulocytes: 0.09 10*3/uL — ABNORMAL HIGH (ref 0.00–0.07)
Basophils Absolute: 0 10*3/uL (ref 0.0–0.1)
Basophils Relative: 0 %
Eosinophils Absolute: 0.1 10*3/uL (ref 0.0–0.5)
Eosinophils Relative: 0 %
HCT: 41.3 % (ref 36.0–46.0)
Hemoglobin: 14 g/dL (ref 12.0–15.0)
Immature Granulocytes: 1 %
Lymphocytes Relative: 8 %
Lymphs Abs: 1.1 10*3/uL (ref 0.7–4.0)
MCH: 35.3 pg — ABNORMAL HIGH (ref 26.0–34.0)
MCHC: 33.9 g/dL (ref 30.0–36.0)
MCV: 104 fL — ABNORMAL HIGH (ref 80.0–100.0)
Monocytes Absolute: 0.7 10*3/uL (ref 0.1–1.0)
Monocytes Relative: 5 %
Neutro Abs: 11.6 10*3/uL — ABNORMAL HIGH (ref 1.7–7.7)
Neutrophils Relative %: 86 %
Platelet Count: 296 10*3/uL (ref 150–400)
RBC: 3.97 MIL/uL (ref 3.87–5.11)
RDW: 11.5 % (ref 11.5–15.5)
WBC Count: 13.5 10*3/uL — ABNORMAL HIGH (ref 4.0–10.5)
nRBC: 0 % (ref 0.0–0.2)

## 2021-06-30 MED ORDER — PROCHLORPERAZINE MALEATE 10 MG PO TABS
10.0000 mg | ORAL_TABLET | Freq: Once | ORAL | Status: AC
  Administered 2021-06-30: 10 mg via ORAL
  Filled 2021-06-30: qty 1

## 2021-06-30 MED ORDER — DENOSUMAB 120 MG/1.7ML ~~LOC~~ SOLN: 120.0000 mg | Freq: Once | SUBCUTANEOUS | Status: DC

## 2021-06-30 MED ORDER — BORTEZOMIB CHEMO SQ INJECTION 3.5 MG (2.5MG/ML)
1.0000 mg/m2 | Freq: Once | INTRAMUSCULAR | Status: AC
  Administered 2021-06-30: 1.75 mg via SUBCUTANEOUS
  Filled 2021-06-30: qty 0.7

## 2021-06-30 NOTE — Progress Notes (Signed)
Per Dr. Jana Hakim, ok to proceed with premedication while CMP is pending.   Per Teldrin, RPH, patient's insurance prefers zometa over xgeva. Patient was notified and stated she did not have time for zometa infusion today. Dr. Jana Hakim made aware.

## 2021-06-30 NOTE — Patient Instructions (Signed)
[facility]  Discharge Instructions: Thank you for choosing [facility] to provide your oncology and hematology care.   If you have a lab appointment with the [facility], please go directly to the [facility] and check in at the registration area.   Wear comfortable clothing and clothing appropriate for easy access to any Portacath or PICC line.   We strive to give you quality time with your provider. You may need to reschedule your appointment if you arrive late (15 or more minutes).  Arriving late affects you and other patients whose appointments are after yours.  Also, if you miss three or more appointments without notifying the office, you may be dismissed from the clinic at the provider's discretion.      For prescription refill requests, have your pharmacy contact our office and allow 72 hours for refills to be completed.    Today you received the following chemotherapy and/or immunotherapy agents: Velcade      To help prevent nausea and vomiting after your treatment, we encourage you to take your nausea medication as directed.  BELOW ARE SYMPTOMS THAT SHOULD BE REPORTED IMMEDIATELY: *FEVER GREATER THAN 100.4 F (38 C) OR HIGHER *CHILLS OR SWEATING *NAUSEA AND VOMITING THAT IS NOT CONTROLLED WITH YOUR NAUSEA MEDICATION *UNUSUAL SHORTNESS OF BREATH *UNUSUAL BRUISING OR BLEEDING *URINARY PROBLEMS (pain or burning when urinating, or frequent urination) *BOWEL PROBLEMS (unusual diarrhea, constipation, pain near the anus) TENDERNESS IN MOUTH AND THROAT WITH OR WITHOUT PRESENCE OF ULCERS (sore throat, sores in mouth, or a toothache) UNUSUAL RASH, SWELLING OR PAIN  UNUSUAL VAGINAL DISCHARGE OR ITCHING   Items with * indicate a potential emergency and should be followed up as soon as possible or go to the Emergency Department if any problems should occur.  Please show the CHEMOTHERAPY ALERT CARD or IMMUNOTHERAPY ALERT CARD at check-in to  the Emergency Department and triage nurse.  Should you have questions after your visit or need to cancel or reschedule your appointment, please contact [facility]  Dept: [phone]  and follow the prompts.  Office hours are 8:00 a.m. to 4:30 p.m. Monday - Friday. Please note that voicemails left after 4:00 p.m. may not be returned until the following business day.  We are closed weekends and major holidays. You have access to a nurse at all times for urgent questions. Please call the main number to the clinic Dept: [phone] and follow the prompts.   For any non-urgent questions, you may also contact your provider using MyChart. We now offer e-Visits for anyone 33 and older to request care online for non-urgent symptoms. For details visit [email or web address].   Also download the MyChart app! Go to the app store, search "MyChart", open the app, select [facility], and log in with your MyChart username and password.  Due to Covid, a mask is required upon entering the hospital/clinic. If you do not have a mask, one will be given to you upon arrival. For doctor visits, patients may have 1 support person aged 6 or older with them. For treatment visits, patients cannot have anyone with them due to current Covid guidelines and our immunocompromised population.   Denosumab injection What is this medication? DENOSUMAB (den oh sue mab) slows bone breakdown. Prolia is used to treat osteoporosis in women after menopause and in men, and in people who are taking corticosteroids for 6 months or more. Delton See is used to treat a high calcium level due  to cancer and to prevent bone fractures and other bone problems caused by multiple myeloma or cancer bone metastases. Xgeva is also used to treat giant cell tumor of the bone. This medicine may be used for other purposes; ask your health care provider or pharmacist if you have questions. COMMON BRAND NAME(S): Prolia, XGEVA What  should I tell my care team before I take this medication? They need to know if you have any of these conditions: dental disease having surgery or tooth extraction infection kidney disease low levels of calcium or Vitamin D in the blood malnutrition on hemodialysis skin conditions or sensitivity thyroid or parathyroid disease an unusual reaction to denosumab, other medicines, foods, dyes, or preservatives pregnant or trying to get pregnant breast-feeding How should I use this medication? This medicine is for injection under the skin. It is given by a health care professional in a hospital or clinic setting. A special MedGuide will be given to you before each treatment. Be sure to read this information carefully each time. For Prolia, talk to your pediatrician regarding the use of this medicine in children. Special care may be needed. For Xgeva, talk to your pediatrician regarding the use of this medicine in children. While this drug may be prescribed for children as young as 13 years for selected conditions, precautions do apply. Overdosage: If you think you have taken too much of this medicine contact a poison control center or emergency room at once. NOTE: This medicine is only for you. Do not share this medicine with others. What if I miss a dose? It is important not to miss your dose. Call your doctor or health care professional if you are unable to keep an appointment. What may interact with this medication? Do not take this medicine with any of the following medications: other medicines containing denosumab This medicine may also interact with the following medications: medicines that lower your chance of fighting infection steroid medicines like prednisone or cortisone This list may not describe all possible interactions. Give your health care provider a list of all the medicines, herbs, non-prescription drugs, or dietary supplements you use. Also tell them if you smoke, drink  alcohol, or use illegal drugs. Some items may interact with your medicine. What should I watch for while using this medication? Visit your doctor or health care professional for regular checks on your progress. Your doctor or health care professional may order blood tests and other tests to see how you are doing. Call your doctor or health care professional for advice if you get a fever, chills or sore throat, or other symptoms of a cold or flu. Do not treat yourself. This drug may decrease your body's ability to fight infection. Try to avoid being around people who are sick. You should make sure you get enough calcium and vitamin D while you are taking this medicine, unless your doctor tells you not to. Discuss the foods you eat and the vitamins you take with your health care professional. See your dentist regularly. Brush and floss your teeth as directed. Before you have any dental work done, tell your dentist you are receiving this medicine. Do not become pregnant while taking this medicine or for 5 months after stopping it. Talk with your doctor or health care professional about your birth control options while taking this medicine. Women should inform their doctor if they wish to become pregnant or think they might be pregnant. There is a potential for serious side effects to an unborn child.   Talk to your health care professional or pharmacist for more information. What side effects may I notice from receiving this medication? Side effects that you should report to your doctor or health care professional as soon as possible: allergic reactions like skin rash, itching or hives, swelling of the face, lips, or tongue bone pain breathing problems dizziness jaw pain, especially after dental work redness, blistering, peeling of the skin signs and symptoms of infection like fever or chills; cough; sore throat; pain or trouble passing urine signs of low calcium like fast heartbeat, muscle cramps or  muscle pain; pain, tingling, numbness in the hands or feet; seizures unusual bleeding or bruising unusually weak or tired Side effects that usually do not require medical attention (report to your doctor or health care professional if they continue or are bothersome): constipation diarrhea headache joint pain loss of appetite muscle pain runny nose tiredness upset stomach This list may not describe all possible side effects. Call your doctor for medical advice about side effects. You may report side effects to FDA at 1-800-FDA-1088. Where should I keep my medication? This medicine is only given in a clinic, doctor's office, or other health care setting and will not be stored at home. NOTE: This sheet is a summary. It may not cover all possible information. If you have questions about this medicine, talk to your doctor, pharmacist, or health care provider.  2022 Elsevier/Gold Standard (2017-12-22 16:10:44)

## 2021-07-01 ENCOUNTER — Telehealth: Payer: Self-pay | Admitting: Internal Medicine

## 2021-07-01 NOTE — Telephone Encounter (Signed)
Wilber Bihari - APP at Oncology wants me to see Robin Wilson for resp issues post myeloma transplant. Please get  patinet in. Ideally needs PFT and HRCT before seeing but if not that is ok  [place]    SIGNATURE    Dr. Brand Males, M.D., F.C.C.P,  Pulmonary and Critical Care Medicine Staff Physician, [facility] Director - Interstitial Lung Disease  Program  Pulmonary [place] at [place], [place], [address]  NPI Number:  NPI #[id number]  Pager: [phone], If no answer  -> Check AMION or Try [phone] Telephone (clinical office): [phone] Telephone (research): [phone]  4:31 PM 07/01/2021

## 2021-07-06 NOTE — Telephone Encounter (Signed)
Called and spoke with pt about the message received from Dr. Chase Caller after he had spoken with Wilber Bihari from Oncology.  Pt said that she had spoken also with Dr. Jana Hakim about this. Pt said that she had an appt scheduled but all I see is a referral.  Routing to [employer] for help with this.  We can get pt in for a consult with MR 12/13. Please advise on this if I am missing anything in regards to a consult appt.

## 2021-07-06 NOTE — Telephone Encounter (Signed)
Called patient to discuss scheduling a consult appointment as I do not see where patient had an appointment scheduled with [facility]. Patient states she had an appointment elsewhere and does not need another appointment.

## 2021-07-07 NOTE — Telephone Encounter (Signed)
Noted.  Will close encounter.  

## 2021-07-16 ENCOUNTER — Inpatient Hospital Stay (HOSPITAL_BASED_OUTPATIENT_CLINIC_OR_DEPARTMENT_OTHER): Payer: 59 | Admitting: Hematology and Oncology

## 2021-07-16 ENCOUNTER — Other Ambulatory Visit: Payer: Self-pay

## 2021-07-16 ENCOUNTER — Inpatient Hospital Stay: Payer: 59

## 2021-07-16 ENCOUNTER — Other Ambulatory Visit: Payer: Self-pay | Admitting: Hematology and Oncology

## 2021-07-16 VITALS — BP 128/75 | HR 81 | Temp 97.1°F | Resp 18 | Wt 134.3 lb

## 2021-07-16 DIAGNOSIS — C9000 Multiple myeloma not having achieved remission: Secondary | ICD-10-CM

## 2021-07-16 DIAGNOSIS — Z5112 Encounter for antineoplastic immunotherapy: Secondary | ICD-10-CM | POA: Diagnosis not present

## 2021-07-16 DIAGNOSIS — Z48298 Encounter for aftercare following other organ transplant: Secondary | ICD-10-CM

## 2021-07-16 LAB — CMP (CANCER CENTER ONLY)
ALT: 18 U/L (ref 0–44)
AST: 15 U/L (ref 15–41)
Albumin: 3.8 g/dL (ref 3.5–5.0)
Alkaline Phosphatase: 63 U/L (ref 38–126)
Anion gap: 9 (ref 5–15)
BUN: 18 mg/dL (ref 6–20)
CO2: 26 mmol/L (ref 22–32)
Calcium: 8.9 mg/dL (ref 8.9–10.3)
Chloride: 107 mmol/L (ref 98–111)
Creatinine: 0.76 mg/dL (ref 0.44–1.00)
GFR, Estimated: >60 mL/min (ref >60)
Glucose, Bld: 95 mg/dL (ref 70–99)
Potassium: 3.4 mmol/L — ABNORMAL LOW (ref 3.5–5.1)
Sodium: 142 mmol/L (ref 135–145)
Total Bilirubin: <0.2 mg/dL — ABNORMAL LOW (ref 0.3–1.2)
Total Protein: 6 g/dL — ABNORMAL LOW (ref 6.5–8.1)

## 2021-07-16 LAB — CBC WITH DIFFERENTIAL (CANCER CENTER ONLY)
Abs Immature Granulocytes: 0.03 10*3/uL (ref 0.00–0.07)
Basophils Absolute: 0 10*3/uL (ref 0.0–0.1)
Basophils Relative: 0 %
Eosinophils Absolute: 0.5 10*3/uL (ref 0.0–0.5)
Eosinophils Relative: 5 %
HCT: 41.3 % (ref 36.0–46.0)
Hemoglobin: 13.9 g/dL (ref 12.0–15.0)
Immature Granulocytes: 0 %
Lymphocytes Relative: 20 %
Lymphs Abs: 1.8 10*3/uL (ref 0.7–4.0)
MCH: 34.8 pg — ABNORMAL HIGH (ref 26.0–34.0)
MCHC: 33.7 g/dL (ref 30.0–36.0)
MCV: 103.3 fL — ABNORMAL HIGH (ref 80.0–100.0)
Monocytes Absolute: 0.7 10*3/uL (ref 0.1–1.0)
Monocytes Relative: 8 %
Neutro Abs: 6 10*3/uL (ref 1.7–7.7)
Neutrophils Relative %: 67 %
Platelet Count: 239 10*3/uL (ref 150–400)
RBC: 4 MIL/uL (ref 3.87–5.11)
RDW: 11.8 % (ref 11.5–15.5)
WBC Count: 9 10*3/uL (ref 4.0–10.5)
nRBC: 0 % (ref 0.0–0.2)

## 2021-07-16 LAB — LACTATE DEHYDROGENASE: LDH: 209 U/L — ABNORMAL HIGH (ref 98–192)

## 2021-07-16 MED ORDER — PROCHLORPERAZINE MALEATE 10 MG PO TABS
10.0000 mg | ORAL_TABLET | Freq: Once | ORAL | Status: AC
  Administered 2021-07-16: 10 mg via ORAL
  Filled 2021-07-16: qty 1

## 2021-07-16 MED ORDER — ZOLEDRONIC ACID 4 MG/100ML IV SOLN
4.0000 mg | Freq: Once | INTRAVENOUS | Status: AC
  Administered 2021-07-16: 4 mg via INTRAVENOUS
  Filled 2021-07-16: qty 100

## 2021-07-16 MED ORDER — SODIUM CHLORIDE 0.9 % IV SOLN: INTRAVENOUS | Status: DC

## 2021-07-16 MED ORDER — BORTEZOMIB CHEMO SQ INJECTION 3.5 MG (2.5MG/ML)
1.0000 mg/m2 | Freq: Once | INTRAMUSCULAR | Status: AC
  Administered 2021-07-16: 1.75 mg via SUBCUTANEOUS
  Filled 2021-07-16: qty 0.7

## 2021-07-16 NOTE — Progress Notes (Signed)
[place] Telephone:([phone]   [phone]  PROGRESS NOTE  Patient Care Team: Maurice Small, MD (Inactive) as PCP - General (Family Medicine) Magrinat, Virgie Dad, MD as Consulting Physician (Oncology) Latanya Maudlin, MD as Consulting Physician (Orthopedic Surgery) Everlene Farrier, MD as Consulting Physician (Obstetrics and Gynecology) Domingo Pulse, MD (Urology) Ronald Lobo, MD as Consulting Physician (Gastroenterology) Markus Daft, MD as Consulting Physician (Interventional Radiology) Erline Levine, MD as Consulting Physician (Neurosurgery) Aris Lot Ramon Dredge, MD as Referring Physician (Hematology and Oncology) Milda Smart, MD as Referring Physician (Diagnostic Radiology)  Hematological/Oncological History # Multiple Myeloma in Remission s/p Bone Marrow Transplant  1) 02/22/2020: presented with Low Back Pain. MRI concerning for wedge fractures/compression fractures  2) 03/05/2020 Myeloma panel shows no M protein but all immunoglobulins are decreased.  The lambda ratio is markedly abnormal at 131.34; review of the blood film is not diagnostic 3) 24-hour urine: markedly elevated Kappa light chains at 9,656; urine kappa/lambda ratio 4180.24, and urine M spike of 239. 4)  bone survey on 03/16/2020 shows rounded lucencies throughout the skull which could be venous lakes  5)  bone marrow biopsy on 7/27 confirms multiple myeloma with 44% plasma cells on the aspirate by manual differential, and 70 to 80% on the biopsy by CD138 immunohistochemistry 6) cytogenetics  03/24/2020: no metaphase cells available for analysis  7)  myeloma FISH studies 03/24/2020 show t(11,14), with NO del(17p), t(1/14) or t(14.16) 8)  on 04/08/2020 LDH 240, beta-2-microglobulin 4.4, albumin 4.2 9)  R-ISS Stage: II    10) bortezomib/dexamethasone started 03/31/2020, most recent dose 10/28/2020             (a) lenalidomide added on 04/21/2020, stopped on 8/26 after two doses  secondary to rash, resumed 04/29/2020 again with a very poor tolerance, treated with Medrol Dosepak, third attempt 05/14/2020, again unable to tolerate              (b) thalidomide added 06/04/2020 to bortezomib/ dexamethasone 05/27/2020, discontinued 06/24/2020 with rash             (c) daratumumab added 07/08/2020, most recent dose 10/21/2020              11) denosumab/Xgeva started 05/06/2020, repeated every 28 day   12) s/p kyphoplasty to T7, T11 and T12 on 06/09/2020 at [facility]   13) s/p high-dose therapy with autologous stem cell rescue 11/23/2020 at [facility]             (a) stem cell reinfusion 11/24/2020             (b) white cell engraftment 12/05/2020, platelet engraftment 12/11/2020             (c) Day +100 bone marrow evaluation normocellular with no increase in plasma cells, however MRD positive.              (d) PET 03/09/2021 showed no FDG-avid bone or other disease; increased tracer uptake in left atrial wall led to cardiac consultation   14) maintenance bortezomib indicated             (a) start of maintenance therapy delayed by post-transplantCHF                         (i) echo 02/2021 showed an ejection fraction in the 20-25% range, following with Dr. Beau Fanny  Interval History:  Robin Wilson 60 y.o. female with medical history significant for multiple myeloma in  remission status post stem cell transplant who presents for a follow up visit. The patient's last visit was on 06/17/2021 with Wilber Bihari under the supervision of Dr. Jana Hakim. In the interim since the last visit she has continued on every 2 week bortezomib maintenance therapy.  On exam today Robin Wilson reports that she has been well in the interim since her last visit.  She notes that she is tolerating bortezomib maintenance without any difficulty.  She denies any numbness or tingling of her fingers and toes.  She notes that she does occasionally have a loose stool a day or so after her bortezomib  injection.  She reports that this does not interfere with her day-to-day life.  She notes that she did have some weight increase prednisone and she has increased up approximately 5 pounds.  She notes she is tolerating Zometa well without any jaw pain or difficulty.  She also notes that she continues having tiredness and leg fatigue when trying to walk which she believes is due to her decreased ejection fraction.  She otherwise denies any fevers, chills, sweats, nausea, vomiting or diarrhea.  Full 10 point ROS is listed below.  MEDICAL HISTORY:  Past Medical History:  Diagnosis Date   Hypertension    Kidney stones    Kidney stones     SURGICAL HISTORY: Past Surgical History:  Procedure Laterality Date   CESAREAN SECTION     CYSTOSCOPY WITH RETROGRADE PYELOGRAM, URETEROSCOPY AND STENT PLACEMENT Right 09/16/2015   Procedure: CYSTOSCOPY WITH RETROGRADE PYELOGRAM, URETEROSCOPY AND STENT PLACEMENT WITH HOLMIUM LASER ABLATION ;  Surgeon: Alexis Frock, MD;  Location: [facility];  Service: Urology;  Laterality: Right;   CYSTOSCOPY/RETROGRADE/URETEROSCOPY  06/04/2012   Procedure: CYSTOSCOPY/RETROGRADE/URETEROSCOPY;  Surgeon: Ailene Rud, MD;  Location: [facility];  Service: Urology;  Laterality: Left;   IR RADIOLOGIST EVAL & MGMT  04/29/2020   UTERINE FIBROID EMBOLIZATION      SOCIAL HISTORY: Social History   Socioeconomic History   Marital status: Married    Spouse name: Not on file   Number of children: Not on file   Years of education: Not on file   Highest education level: Not on file  Occupational History   Not on file  Tobacco Use   Smoking status: Never   Smokeless tobacco: Never  Substance and Sexual Activity   Alcohol use: No   Drug use: No   Sexual activity: Never  Other Topics Concern   Not on file  Social History Narrative   Not on file   Social Determinants of Health   Financial Resource Strain: Not on file  Food Insecurity: Not on file  Transportation Needs: Not on  file  Physical Activity: Not on file  Stress: Not on file  Social Connections: Not on file  Intimate Partner Violence: Not on file    FAMILY HISTORY: Family History  Problem Relation Age of Onset   Breast cancer Neg Hx     ALLERGIES:  is allergic to revlimid [lenalidomide], ace inhibitors, other, and sulfa drugs cross reactors.  MEDICATIONS:  Current Outpatient Medications  Medication Sig Dispense Refill   predniSONE (DELTASONE) 5 MG tablet 1 tablet     acyclovir (ZOVIRAX) 800 MG tablet Take 1 tablet (800 mg total) by mouth 2 (two) times daily. 60 tablet 12   albuterol (VENTOLIN HFA) 108 (90 Base) MCG/ACT inhaler Inhale 2 puffs into the lungs every 4 (four) hours as needed.     BREO ELLIPTA 200-25 MCG/ACT AEPB Inhale 1 puff  into the lungs daily.     calcium carbonate (OS-CAL - DOSED IN MG OF ELEMENTAL CALCIUM) 1250 MG tablet Take 1 tablet by mouth daily.       clobetasol cream (TEMOVATE) 0.05 %      clonazePAM (KLONOPIN) 0.5 MG tablet Take 0.5 mg by mouth 2 (two) times daily as needed.     ENTRESTO 24-26 MG Take 1 tablet by mouth 2 (two) times daily.     folic acid (FOLVITE) 1 MG tablet Take 1 mg by mouth daily.     hydrocortisone 2.5 % ointment Apply to the eczema rash on the face once or twice daily as needed.     hydrOXYzine (ATARAX/VISTARIL) 10 MG tablet Take 1 tablet (10 mg total) by mouth every 4 (four) hours as needed for anxiety. 30 tablet 0   JARDIANCE 10 MG TABS tablet Take 10 mg by mouth daily.     metoprolol succinate (TOPROL-XL) 25 MG 24 hr tablet Take 12.5 mg by mouth daily.     Multiple Vitamins-Minerals (MULTIVITAMIN PO) Take 1 tablet by mouth daily.      prochlorperazine (COMPAZINE) 5 MG tablet Take 2 tablets (10 mg total) by mouth every 6 (six) hours as needed for nausea or vomiting. 30 tablet 1   sertraline (ZOLOFT) 50 MG tablet Take 75 mg by mouth daily.     spironolactone (ALDACTONE) 25 MG tablet Take 25 mg by mouth daily.     traZODone (DESYREL) 50 MG tablet  Take 50 mg by mouth at bedtime as needed.     No current facility-administered medications for this visit.    REVIEW OF SYSTEMS:   Constitutional: ( - ) fevers, ( - )  chills , ( - ) night sweats Eyes: ( - ) blurriness of vision, ( - ) double vision, ( - ) watery eyes Ears, nose, mouth, throat, and face: ( - ) mucositis, ( - ) sore throat Respiratory: ( - ) cough, ( - ) dyspnea, ( - ) wheezes Cardiovascular: ( - ) palpitation, ( - ) chest discomfort, ( - ) lower extremity swelling Gastrointestinal:  ( - ) nausea, ( - ) heartburn, ( - ) change in bowel habits Skin: ( - ) abnormal skin rashes Lymphatics: ( - ) new lymphadenopathy, ( - ) easy bruising Neurological: ( - ) numbness, ( - ) tingling, ( - ) new weaknesses Behavioral/Psych: ( - ) mood change, ( - ) new changes  All other systems were reviewed with the patient and are negative.  PHYSICAL EXAMINATION: ECOG PERFORMANCE STATUS: 1 - Symptomatic but completely ambulatory  Vitals:   07/16/21 1042  BP: 128/75  Pulse: 81  Resp: 18  Temp: (!) 97.1 F (36.2 C)  SpO2: 97%   Filed Weights   07/16/21 1042  Weight: 134 lb 5 oz (60.9 kg)    GENERAL: Well-appearing middle-aged Caucasian female alert, no distress and comfortable SKIN: skin color, texture, turgor are normal, no rashes or significant lesions EYES: conjunctiva are pink and non-injected, sclera clear LUNGS: clear to auscultation and percussion with normal breathing effort HEART: regular rate & rhythm and no murmurs and no lower extremity edema Musculoskeletal: no cyanosis of digits and no clubbing  PSYCH: alert & oriented x 3, fluent speech NEURO: no focal motor/sensory deficits  LABORATORY DATA:  I have reviewed the data as listed CBC Latest Ref Rng & Units 07/16/2021 06/30/2021 06/17/2021  WBC 4.0 - 10.5 K/uL 9.0 13.5(H) 15.4(H)  Hemoglobin 12.0 - 15.0 g/dL 13.9 14.0  12.8  Hematocrit 36.0 - 46.0 % 41.3 41.3 37.8  Platelets 150 - 400 K/uL 239 296 279    CMP  Latest Ref Rng & Units 07/16/2021 06/30/2021 06/17/2021  Glucose 70 - 99 mg/dL 95 119(H) 76  BUN 6 - 20 mg/dL 18 23(H) 18  Creatinine 0.44 - 1.00 mg/dL 0.76 0.81 0.77  Sodium 135 - 145 mmol/L 142 141 142  Potassium 3.5 - 5.1 mmol/L 3.4(L) 5.0 3.7  Chloride 98 - 111 mmol/L 107 103 106  CO2 22 - 32 mmol/L 26 28 25   Calcium 8.9 - 10.3 mg/dL 8.9 9.8 9.5  Total Protein 6.5 - 8.1 g/dL 6.0(L) 6.5 6.8  Total Bilirubin 0.3 - 1.2 mg/dL <0.2(L) 0.3 0.3  Alkaline Phos 38 - 126 U/L 63 70 74  AST 15 - 41 U/L 15 14(L) 14(L)  ALT 0 - 44 U/L 18 20 16     Lab Results  Component Value Date   MPROTEIN Not Observed 06/17/2021   MPROTEIN 0.1 (H) 01/19/2021   MPROTEIN Not Observed 04/15/2020   Lab Results  Component Value Date   KPAFRELGTCHN 6.2 06/17/2021   KPAFRELGTCHN 6.0 04/19/2021   KPAFRELGTCHN 5.1 02/25/2021   LAMBDASER 2.6 (L) 06/17/2021   LAMBDASER <1.5 (L) 04/19/2021   LAMBDASER 1.5 (L) 02/25/2021   KAPLAMBRATIO 2.38 (H) 06/17/2021   KAPLAMBRATIO >4.00 (H) 04/19/2021   KAPLAMBRATIO 3.40 (H) 02/25/2021    RADIOGRAPHIC STUDIES: No results found.  ASSESSMENT & PLAN Robin Wilson 60 y.o. female with medical history significant for multiple myeloma in remission status post stem cell transplant who presents for a follow up visit.  After review of labs, review the records, discussion with the patient her findings are most consistent with multiple myeloma in remission status post autosomal stem cell transplant at [facility].  She was treated there by Dr. Quentin Ore.  Unfortunately during the course of her treatment she was found to be intolerant to Revlimid due to rash/hives.  As such she is been on maintenance with bortezomib shots every 2 weeks.  We will continue this therapy moving forward as we assume her care from Dr. Jana Hakim.  At this time her M protein is undetectable and her blood counts are stable.  # Multiple Myeloma in Remission s/p ASCT -- We will continue the  patient's current maintenance plan of bortezomib subcutaneous every 2 weeks as maintenance therapy. -- Patient is not a candidate for Revlimid maintenance therapy as she developed hives/rash with this treatment --Defer to [facility] for administration of vaccines and vaccine timeline --We will continue every 4 week visits with restaging labs and interval every 2 week bortezomib treatments.   #Supportive Care -- zofran 42m q8H PRN and compazine 133mPO q6H for nausea -- valacyclovir 14m65mO daily for VCZ prophylaxis -- zometa q 3 monthly infusions x 2 years.  -- No pain medication required at this time.   No orders of the defined types were placed in this encounter.   All questions were answered. The patient knows to call the clinic with any problems, questions or concerns.  A total of more than 40 minutes were spent on this encounter with face-to-face time and non-face-to-face time, including preparing to see the patient, ordering tests and/or medications, counseling the patient and coordination of care as outlined above.   JohLedell PeoplesD Department of Hematology/Oncology ConB[facility]kinone: [phone]: [phone]: [email or web address]  07/18/2021 4:34 PM

## 2021-07-16 NOTE — Patient Instructions (Signed)
[facility]  Discharge Instructions: Thank you for choosing [facility] to provide your oncology and hematology care.   If you have a lab appointment with the [facility], please go directly to the [facility] and check in at the registration area.   Wear comfortable clothing and clothing appropriate for easy access to any Portacath or PICC line.   We strive to give you quality time with your provider. You may need to reschedule your appointment if you arrive late (15 or more minutes).  Arriving late affects you and other patients whose appointments are after yours.  Also, if you miss three or more appointments without notifying the office, you may be dismissed from the clinic at the provider's discretion.      For prescription refill requests, have your pharmacy contact our office and allow 72 hours for refills to be completed.    Today you received the following chemotherapy and/or immunotherapy agents Velcade & Zometa      To help prevent nausea and vomiting after your treatment, we encourage you to take your nausea medication as directed.  BELOW ARE SYMPTOMS THAT SHOULD BE REPORTED IMMEDIATELY: *FEVER GREATER THAN 100.4 F (38 C) OR HIGHER *CHILLS OR SWEATING *NAUSEA AND VOMITING THAT IS NOT CONTROLLED WITH YOUR NAUSEA MEDICATION *UNUSUAL SHORTNESS OF BREATH *UNUSUAL BRUISING OR BLEEDING *URINARY PROBLEMS (pain or burning when urinating, or frequent urination) *BOWEL PROBLEMS (unusual diarrhea, constipation, pain near the anus) TENDERNESS IN MOUTH AND THROAT WITH OR WITHOUT PRESENCE OF ULCERS (sore throat, sores in mouth, or a toothache) UNUSUAL RASH, SWELLING OR PAIN  UNUSUAL VAGINAL DISCHARGE OR ITCHING   Items with * indicate a potential emergency and should be followed up as soon as possible or go to the Emergency Department if any problems should occur.  Please show the CHEMOTHERAPY ALERT CARD or IMMUNOTHERAPY ALERT CARD at  check-in to the Emergency Department and triage nurse.  Should you have questions after your visit or need to cancel or reschedule your appointment, please contact [facility]  Dept: [phone]  and follow the prompts.  Office hours are 8:00 a.m. to 4:30 p.m. Monday - Friday. Please note that voicemails left after 4:00 p.m. may not be returned until the following business day.  We are closed weekends and major holidays. You have access to a nurse at all times for urgent questions. Please call the main number to the clinic Dept: [phone] and follow the prompts.   For any non-urgent questions, you may also contact your provider using MyChart. We now offer e-Visits for anyone 35 and older to request care online for non-urgent symptoms. For details visit [email or web address].   Also download the MyChart app! Go to the app store, search "MyChart", open the app, select [facility], and log in with your MyChart username and password.  Due to Covid, a mask is required upon entering the hospital/clinic. If you do not have a mask, one will be given to you upon arrival. For doctor visits, patients may have 1 support person aged 58 or older with them. For treatment visits, patients cannot have anyone with them due to current Covid guidelines and our immunocompromised population.   Bortezomib injection What is this medication? BORTEZOMIB (bor TEZ oh mib) targets proteins in cancer cells and stops the cancer cells from growing. It treats multiple myeloma and mantle cell lymphoma. This medicine may be used for other purposes; ask your health care provider or pharmacist if  you have questions. COMMON BRAND NAME(S): Velcade What should I tell my care team before I take this medication? They need to know if you have any of these conditions: dehydration diabetes (high blood sugar) heart disease liver disease tingling of the fingers or toes or other nerve disorder an unusual  or allergic reaction to bortezomib, mannitol, boron, other medicines, foods, dyes, or preservatives pregnant or trying to get pregnant breast-feeding How should I use this medication? This medicine is injected into a vein or under the skin. It is given by a health care provider in a hospital or clinic setting. Talk to your health care provider about the use of this medicine in children. Special care may be needed. Overdosage: If you think you have taken too much of this medicine contact a poison control center or emergency room at once. NOTE: This medicine is only for you. Do not share this medicine with others. What if I miss a dose? Keep appointments for follow-up doses. It is important not to miss your dose. Call your health care provider if you are unable to keep an appointment. What may interact with this medication? This medicine may interact with the following medications: ketoconazole rifampin This list may not describe all possible interactions. Give your health care provider a list of all the medicines, herbs, non-prescription drugs, or dietary supplements you use. Also tell them if you smoke, drink alcohol, or use illegal drugs. Some items may interact with your medicine. What should I watch for while using this medication? Your condition will be monitored carefully while you are receiving this medicine. You may need blood work done while you are taking this medicine. You may get drowsy or dizzy. Do not drive, use machinery, or do anything that needs mental alertness until you know how this medicine affects you. Do not stand up or sit up quickly, especially if you are an older patient. This reduces the risk of dizzy or fainting spells This medicine may increase your risk of getting an infection. Call your health care provider for advice if you get a fever, chills, sore throat, or other symptoms of a cold or flu. Do not treat yourself. Try to avoid being around people who are  sick. Check with your health care provider if you have severe diarrhea, nausea, and vomiting, or if you sweat a lot. The loss of too much body fluid may make it dangerous for you to take this medicine. Do not become pregnant while taking this medicine or for 7 months after stopping it. Women should inform their health care provider if they wish to become pregnant or think they might be pregnant. Men should not father a child while taking this medicine and for 4 months after stopping it. There is a potential for serious harm to an unborn child. Talk to your health care provider for more information. Do not breast-feed an infant while taking this medicine or for 2 months after stopping it. This medicine may make it more difficult to get pregnant or father a child. Talk to your health care provider if you are concerned about your fertility. What side effects may I notice from receiving this medication? Side effects that you should report to your doctor or health care professional as soon as possible: allergic reactions (skin rash; itching or hives; swelling of the face, lips, or tongue) bleeding (bloody or black, tarry stools; red or dark brown urine; spitting up blood or brown material that looks like coffee grounds; red spots on the  skin; unusual bruising or bleeding from the eye, gums, or nose) blurred vision or changes in vision confusion constipation headache heart failure (trouble breathing; fast, irregular heartbeat; sudden weight gain; swelling of the ankles, feet, hands) infection (fever, chills, cough, sore throat, pain or trouble passing urine) lack or loss of appetite liver injury (dark yellow or brown urine; general ill feeling or flu-like symptoms; loss of appetite, right upper belly pain; yellowing of the eyes or skin) low blood pressure (dizziness; feeling faint or lightheaded, falls; unusually weak or tired) muscle cramps pain, redness, or irritation at site where injected pain,  tingling, numbness in the hands or feet seizures trouble breathing unusual bruising or bleeding Side effects that usually do not require medical attention (report to your doctor or health care professional if they continue or are bothersome): diarrhea nausea stomach pain trouble sleeping vomiting This list may not describe all possible side effects. Call your doctor for medical advice about side effects. You may report side effects to FDA at 1-800-FDA-1088. Where should I keep my medication? This medicine is given in a hospital or clinic. It will not be stored at home. NOTE: This sheet is a summary. It may not cover all possible information. If you have questions about this medicine, talk to your doctor, pharmacist, or health care provider.  2022 Elsevier/Gold Standard (2020-08-06 00:00:00)  Zoledronic Acid Injection (Hypercalcemia, Oncology) What is this medication? ZOLEDRONIC ACID (ZOE le dron ik AS id) slows calcium loss from bones. It high calcium levels in the blood from some kinds of cancer. It may be used in other people at risk for bone loss. This medicine may be used for other purposes; ask your health care provider or pharmacist if you have questions. COMMON BRAND NAME(S): Zometa What should I tell my care team before I take this medication? They need to know if you have any of these conditions: cancer dehydration dental disease kidney disease liver disease low levels of calcium in the blood lung or breathing disease (asthma) receiving steroids like dexamethasone or prednisone an unusual or allergic reaction to zoledronic acid, other medicines, foods, dyes, or preservatives pregnant or trying to get pregnant breast-feeding How should I use this medication? This drug is injected into a vein. It is given by a health care provider in a hospital or clinic setting. Talk to your health care provider about the use of this drug in children. Special care may be  needed. Overdosage: If you think you have taken too much of this medicine contact a poison control center or emergency room at once. NOTE: This medicine is only for you. Do not share this medicine with others. What if I miss a dose? Keep appointments for follow-up doses. It is important not to miss your dose. Call your health care provider if you are unable to keep an appointment. What may interact with this medication? certain antibiotics given by injection NSAIDs, medicines for pain and inflammation, like ibuprofen or naproxen some diuretics like bumetanide, furosemide teriparatide thalidomide This list may not describe all possible interactions. Give your health care provider a list of all the medicines, herbs, non-prescription drugs, or dietary supplements you use. Also tell them if you smoke, drink alcohol, or use illegal drugs. Some items may interact with your medicine. What should I watch for while using this medication? Visit your health care provider for regular checks on your progress. It may be some time before you see the benefit from this drug. Some people who take this drug have  severe bone, joint, or muscle pain. This drug may also increase your risk for jaw problems or a broken thigh bone. Tell your health care provider right away if you have severe pain in your jaw, bones, joints, or muscles. Tell you health care provider if you have any pain that does not go away or that gets worse. Tell your dentist and dental surgeon that you are taking this drug. You should not have major dental surgery while on this drug. See your dentist to have a dental exam and fix any dental problems before starting this drug. Take good care of your teeth while on this drug. Make sure you see your dentist for regular follow-up appointments. You should make sure you get enough calcium and vitamin D while you are taking this drug. Discuss the foods you eat and the vitamins you take with your health care  provider. Check with your health care provider if you have severe diarrhea, nausea, and vomiting, or if you sweat a lot. The loss of too much body fluid may make it dangerous for you to take this drug. You may need blood work done while you are taking this drug. Do not become pregnant while taking this drug. Women should inform their health care provider if they wish to become pregnant or think they might be pregnant. There is potential for serious harm to an unborn child. Talk to your health care provider for more information. What side effects may I notice from receiving this medication? Side effects that you should report to your doctor or health care provider as soon as possible: allergic reactions (skin rash, itching or hives; swelling of the face, lips, or tongue) bone pain infection (fever, chills, cough, sore throat, pain or trouble passing urine) jaw pain, especially after dental work joint pain kidney injury (trouble passing urine or change in the amount of urine) low blood pressure (dizziness; feeling faint or lightheaded, falls; unusually weak or tired) low calcium levels (fast heartbeat; muscle cramps or pain; pain, tingling, or numbness in the hands or feet; seizures) low magnesium levels (fast, irregular heartbeat; muscle cramp or pain; muscle weakness; tremors; seizures) low red blood cell counts (trouble breathing; feeling faint; lightheaded, falls; unusually weak or tired) muscle pain redness, blistering, peeling, or loosening of the skin, including inside the mouth severe diarrhea swelling of the ankles, feet, hands trouble breathing Side effects that usually do not require medical attention (report to your doctor or health care provider if they continue or are bothersome): anxious constipation coughing depressed mood eye irritation, itching, or pain fever general ill feeling or flu-like symptoms nausea pain, redness, or irritation at site where injected trouble  sleeping This list may not describe all possible side effects. Call your doctor for medical advice about side effects. You may report side effects to FDA at 1-800-FDA-1088. Where should I keep my medication? This drug is given in a hospital or clinic. It will not be stored at home. NOTE: This sheet is a summary. It may not cover all possible information. If you have questions about this medicine, talk to your doctor, pharmacist, or health care provider.  2022 Elsevier/Gold Standard (2021-05-04 00:00:00)

## 2021-07-18 ENCOUNTER — Encounter: Payer: Self-pay | Admitting: Oncology

## 2021-07-19 LAB — KAPPA/LAMBDA LIGHT CHAINS
Kappa free light chain: 5.8 mg/L (ref 3.3–19.4)
Kappa, lambda light chain ratio: 2.52 — ABNORMAL HIGH (ref 0.26–1.65)
Lambda free light chains: 2.3 mg/L — ABNORMAL LOW (ref 5.7–26.3)

## 2021-07-20 LAB — MULTIPLE MYELOMA PANEL, SERUM
Albumin SerPl Elph-Mcnc: 3.8 g/dL (ref 2.9–4.4)
Albumin/Glob SerPl: 1.8 — ABNORMAL HIGH (ref 0.7–1.7)
Alpha 1: 0.2 g/dL (ref 0.0–0.4)
Alpha2 Glob SerPl Elph-Mcnc: 0.6 g/dL (ref 0.4–1.0)
B-Globulin SerPl Elph-Mcnc: 0.9 g/dL (ref 0.7–1.3)
Gamma Glob SerPl Elph-Mcnc: 0.4 g/dL (ref 0.4–1.8)
Globulin, Total: 2.2 g/dL (ref 2.2–3.9)
IgA: 9 mg/dL — ABNORMAL LOW (ref 87–352)
IgG (Immunoglobin G), Serum: 439 mg/dL — ABNORMAL LOW (ref 586–1602)
IgM (Immunoglobulin M), Srm: 19 mg/dL — ABNORMAL LOW (ref 26–217)
Total Protein ELP: 6 g/dL (ref 6.0–8.5)

## 2021-07-30 ENCOUNTER — Inpatient Hospital Stay: Payer: 59

## 2021-07-30 ENCOUNTER — Other Ambulatory Visit: Payer: Self-pay

## 2021-07-30 ENCOUNTER — Inpatient Hospital Stay: Payer: 59 | Attending: Oncology

## 2021-07-30 VITALS — BP 129/72 | HR 73 | Temp 98.4°F | Resp 18 | Wt 134.2 lb

## 2021-07-30 DIAGNOSIS — C9001 Multiple myeloma in remission: Secondary | ICD-10-CM | POA: Diagnosis present

## 2021-07-30 DIAGNOSIS — Z79899 Other long term (current) drug therapy: Secondary | ICD-10-CM | POA: Insufficient documentation

## 2021-07-30 DIAGNOSIS — C9000 Multiple myeloma not having achieved remission: Secondary | ICD-10-CM

## 2021-07-30 DIAGNOSIS — Z5112 Encounter for antineoplastic immunotherapy: Secondary | ICD-10-CM | POA: Diagnosis present

## 2021-07-30 LAB — CBC WITH DIFFERENTIAL (CANCER CENTER ONLY)
Abs Immature Granulocytes: 0.04 10*3/uL (ref 0.00–0.07)
Basophils Absolute: 0.1 10*3/uL (ref 0.0–0.1)
Basophils Relative: 1 %
Eosinophils Absolute: 0.5 10*3/uL (ref 0.0–0.5)
Eosinophils Relative: 6 %
HCT: 42.5 % (ref 36.0–46.0)
Hemoglobin: 14.4 g/dL (ref 12.0–15.0)
Immature Granulocytes: 1 %
Lymphocytes Relative: 14 %
Lymphs Abs: 1.2 10*3/uL (ref 0.7–4.0)
MCH: 34.4 pg — ABNORMAL HIGH (ref 26.0–34.0)
MCHC: 33.9 g/dL (ref 30.0–36.0)
MCV: 101.7 fL — ABNORMAL HIGH (ref 80.0–100.0)
Monocytes Absolute: 0.7 10*3/uL (ref 0.1–1.0)
Monocytes Relative: 9 %
Neutro Abs: 5.7 10*3/uL (ref 1.7–7.7)
Neutrophils Relative %: 69 %
Platelet Count: 272 10*3/uL (ref 150–400)
RBC: 4.18 MIL/uL (ref 3.87–5.11)
RDW: 11.9 % (ref 11.5–15.5)
WBC Count: 8.1 10*3/uL (ref 4.0–10.5)
nRBC: 0 % (ref 0.0–0.2)

## 2021-07-30 LAB — CMP (CANCER CENTER ONLY)
ALT: 17 U/L (ref 0–44)
AST: 15 U/L (ref 15–41)
Albumin: 3.8 g/dL (ref 3.5–5.0)
Alkaline Phosphatase: 85 U/L (ref 38–126)
Anion gap: 8 (ref 5–15)
BUN: 19 mg/dL (ref 6–20)
CO2: 26 mmol/L (ref 22–32)
Calcium: 8.9 mg/dL (ref 8.9–10.3)
Chloride: 108 mmol/L (ref 98–111)
Creatinine: 0.76 mg/dL (ref 0.44–1.00)
GFR, Estimated: >60 mL/min (ref >60)
Glucose, Bld: 89 mg/dL (ref 70–99)
Potassium: 4.4 mmol/L (ref 3.5–5.1)
Sodium: 142 mmol/L (ref 135–145)
Total Bilirubin: 0.3 mg/dL (ref 0.3–1.2)
Total Protein: 6.3 g/dL — ABNORMAL LOW (ref 6.5–8.1)

## 2021-07-30 LAB — LACTATE DEHYDROGENASE: LDH: 198 U/L — ABNORMAL HIGH (ref 98–192)

## 2021-07-30 MED ORDER — PROCHLORPERAZINE MALEATE 10 MG PO TABS
10.0000 mg | ORAL_TABLET | Freq: Once | ORAL | Status: AC
  Administered 2021-07-30: 10 mg via ORAL
  Filled 2021-07-30: qty 1

## 2021-07-30 MED ORDER — BORTEZOMIB CHEMO SQ INJECTION 3.5 MG (2.5MG/ML)
1.0000 mg/m2 | Freq: Once | INTRAMUSCULAR | Status: AC
  Administered 2021-07-30: 1.75 mg via SUBCUTANEOUS
  Filled 2021-07-30: qty 0.7

## 2021-07-30 NOTE — Patient Instructions (Signed)
[facility]  Discharge Instructions: Thank you for choosing [facility] to provide your oncology and hematology care.   If you have a lab appointment with the [facility], please go directly to the [facility] and check in at the registration area.   Wear comfortable clothing and clothing appropriate for easy access to any Portacath or PICC line.   We strive to give you quality time with your provider. You may need to reschedule your appointment if you arrive late (15 or more minutes).  Arriving late affects you and other patients whose appointments are after yours.  Also, if you miss three or more appointments without notifying the office, you may be dismissed from the clinic at the provider's discretion.      For prescription refill requests, have your pharmacy contact our office and allow 72 hours for refills to be completed.    Today you received the following chemotherapy and/or immunotherapy agents: velcade      To help prevent nausea and vomiting after your treatment, we encourage you to take your nausea medication as directed.  BELOW ARE SYMPTOMS THAT SHOULD BE REPORTED IMMEDIATELY: *FEVER GREATER THAN 100.4 F (38 C) OR HIGHER *CHILLS OR SWEATING *NAUSEA AND VOMITING THAT IS NOT CONTROLLED WITH YOUR NAUSEA MEDICATION *UNUSUAL SHORTNESS OF BREATH *UNUSUAL BRUISING OR BLEEDING *URINARY PROBLEMS (pain or burning when urinating, or frequent urination) *BOWEL PROBLEMS (unusual diarrhea, constipation, pain near the anus) TENDERNESS IN MOUTH AND THROAT WITH OR WITHOUT PRESENCE OF ULCERS (sore throat, sores in mouth, or a toothache) UNUSUAL RASH, SWELLING OR PAIN  UNUSUAL VAGINAL DISCHARGE OR ITCHING   Items with * indicate a potential emergency and should be followed up as soon as possible or go to the Emergency Department if any problems should occur.  Please show the CHEMOTHERAPY ALERT CARD or IMMUNOTHERAPY ALERT CARD at check-in to  the Emergency Department and triage nurse.  Should you have questions after your visit or need to cancel or reschedule your appointment, please contact [facility]  Dept: [phone]  and follow the prompts.  Office hours are 8:00 a.m. to 4:30 p.m. Monday - Friday. Please note that voicemails left after 4:00 p.m. may not be returned until the following business day.  We are closed weekends and major holidays. You have access to a nurse at all times for urgent questions. Please call the main number to the clinic Dept: [phone] and follow the prompts.   For any non-urgent questions, you may also contact your provider using MyChart. We now offer e-Visits for anyone 58 and older to request care online for non-urgent symptoms. For details visit [email or web address].   Also download the MyChart app! Go to the app store, search "MyChart", open the app, select [facility], and log in with your MyChart username and password.  Due to Covid, a mask is required upon entering the hospital/clinic. If you do not have a mask, one will be given to you upon arrival. For doctor visits, patients may have 1 support person aged 18 or older with them. For treatment visits, patients cannot have anyone with them due to current Covid guidelines and our immunocompromised population.

## 2021-08-13 ENCOUNTER — Inpatient Hospital Stay: Payer: 59

## 2021-08-13 ENCOUNTER — Other Ambulatory Visit: Payer: Self-pay

## 2021-08-13 ENCOUNTER — Other Ambulatory Visit: Payer: Self-pay | Admitting: Hematology and Oncology

## 2021-08-13 ENCOUNTER — Inpatient Hospital Stay (HOSPITAL_BASED_OUTPATIENT_CLINIC_OR_DEPARTMENT_OTHER): Payer: 59 | Admitting: Hematology and Oncology

## 2021-08-13 VITALS — HR 90

## 2021-08-13 DIAGNOSIS — C9000 Multiple myeloma not having achieved remission: Secondary | ICD-10-CM | POA: Diagnosis not present

## 2021-08-13 DIAGNOSIS — Z5112 Encounter for antineoplastic immunotherapy: Secondary | ICD-10-CM | POA: Diagnosis not present

## 2021-08-13 LAB — CBC WITH DIFFERENTIAL (CANCER CENTER ONLY)
Abs Immature Granulocytes: 0.02 10*3/uL (ref 0.00–0.07)
Basophils Absolute: 0.1 10*3/uL (ref 0.0–0.1)
Basophils Relative: 1 %
Eosinophils Absolute: 1.7 10*3/uL — ABNORMAL HIGH (ref 0.0–0.5)
Eosinophils Relative: 18 %
HCT: 45.8 % (ref 36.0–46.0)
Hemoglobin: 15.8 g/dL — ABNORMAL HIGH (ref 12.0–15.0)
Immature Granulocytes: 0 %
Lymphocytes Relative: 11 %
Lymphs Abs: 1.1 10*3/uL (ref 0.7–4.0)
MCH: 34 pg (ref 26.0–34.0)
MCHC: 34.5 g/dL (ref 30.0–36.0)
MCV: 98.5 fL (ref 80.0–100.0)
Monocytes Absolute: 1.2 10*3/uL — ABNORMAL HIGH (ref 0.1–1.0)
Monocytes Relative: 12 %
Neutro Abs: 5.5 10*3/uL (ref 1.7–7.7)
Neutrophils Relative %: 58 %
Platelet Count: 284 10*3/uL (ref 150–400)
RBC: 4.65 MIL/uL (ref 3.87–5.11)
RDW: 11.8 % (ref 11.5–15.5)
WBC Count: 9.6 10*3/uL (ref 4.0–10.5)
nRBC: 0 % (ref 0.0–0.2)

## 2021-08-13 LAB — CMP (CANCER CENTER ONLY)
ALT: 17 U/L (ref 0–44)
AST: 17 U/L (ref 15–41)
Albumin: 4 g/dL (ref 3.5–5.0)
Alkaline Phosphatase: 99 U/L (ref 38–126)
Anion gap: 10 (ref 5–15)
BUN: 16 mg/dL (ref 6–20)
CO2: 27 mmol/L (ref 22–32)
Calcium: 9.4 mg/dL (ref 8.9–10.3)
Chloride: 105 mmol/L (ref 98–111)
Creatinine: 0.87 mg/dL (ref 0.44–1.00)
GFR, Estimated: >60 mL/min (ref >60)
Glucose, Bld: 95 mg/dL (ref 70–99)
Potassium: 4.4 mmol/L (ref 3.5–5.1)
Sodium: 142 mmol/L (ref 135–145)
Total Bilirubin: 0.4 mg/dL (ref 0.3–1.2)
Total Protein: 6.9 g/dL (ref 6.5–8.1)

## 2021-08-13 LAB — LACTATE DEHYDROGENASE: LDH: 263 U/L — ABNORMAL HIGH (ref 98–192)

## 2021-08-13 MED ORDER — FOLIC ACID 1 MG PO TABS: 1.0000 mg | ORAL_TABLET | Freq: Every day | ORAL | 3 refills | Status: AC

## 2021-08-13 MED ORDER — BORTEZOMIB CHEMO SQ INJECTION 3.5 MG (2.5MG/ML)
1.0000 mg/m2 | Freq: Once | INTRAMUSCULAR | Status: AC
  Administered 2021-08-13: 1.75 mg via SUBCUTANEOUS
  Filled 2021-08-13: qty 0.7

## 2021-08-13 MED ORDER — PROCHLORPERAZINE MALEATE 10 MG PO TABS
10.0000 mg | ORAL_TABLET | Freq: Once | ORAL | Status: AC
  Administered 2021-08-13: 10 mg via ORAL

## 2021-08-13 MED ORDER — ACYCLOVIR 400 MG PO TABS: 400.0000 mg | ORAL_TABLET | Freq: Two times a day (BID) | ORAL | 3 refills | Status: AC

## 2021-08-13 NOTE — Progress Notes (Signed)
[facility] [phone]   [phone]  PROGRESS NOTE  Patient Care Team: Maurice Small, MD as PCP - General (Family Medicine) Magrinat, Virgie Dad, MD as Consulting Physician (Oncology) Latanya Maudlin, MD as Consulting Physician (Orthopedic Surgery) Everlene Farrier, MD as Consulting Physician (Obstetrics and Gynecology) Domingo Pulse, MD (Urology) Ronald Lobo, MD as Consulting Physician (Gastroenterology) Markus Daft, MD as Consulting Physician (Interventional Radiology) Erline Levine, MD as Consulting Physician (Neurosurgery) Aris Lot Ramon Dredge, MD as Referring Physician (Hematology and Oncology) Milda Smart, MD as Referring Physician (Diagnostic Radiology)  Hematological/Oncological History # Multiple Myeloma in Remission s/p Bone Marrow Transplant  1) 02/22/2020: presented with Low Back Pain. MRI concerning for wedge fractures/compression fractures  2) 03/05/2020 Myeloma panel shows no M protein but all immunoglobulins are decreased.  The lambda ratio is markedly abnormal at 131.34; review of the blood film is not diagnostic 3) 24-hour urine: markedly elevated Kappa light chains at 9,656; urine kappa/lambda ratio 4180.24, and urine M spike of 239. 4)  bone survey on 03/16/2020 shows rounded lucencies throughout the skull which could be venous lakes  5)  bone marrow biopsy on 7/27 confirms multiple myeloma with 44% plasma cells on the aspirate by manual differential, and 70 to 80% on the biopsy by CD138 immunohistochemistry 6) cytogenetics  03/24/2020: no metaphase cells available for analysis  7)  myeloma FISH studies 03/24/2020 show t(11,14), with NO del(17p), t(1/14) or t(14.16) 8)  on 04/08/2020 LDH 240, beta-2-microglobulin 4.4, albumin 4.2 9)  R-ISS Stage: II    10) bortezomib/dexamethasone started 03/31/2020, most recent dose 10/28/2020             (a) lenalidomide added on 04/21/2020, stopped on 8/26 after two doses secondary to  rash, resumed 04/29/2020 again with a very poor tolerance, treated with Medrol Dosepak, third attempt 05/14/2020, again unable to tolerate              (b) thalidomide added 06/04/2020 to bortezomib/ dexamethasone 05/27/2020, discontinued 06/24/2020 with rash             (c) daratumumab added 07/08/2020, most recent dose 10/21/2020              11) denosumab/Xgeva started 05/06/2020, repeated every 3 months   12) s/p kyphoplasty to T7, T11 and T12 on 06/09/2020 at [facility]   13) s/p high-dose therapy with autologous stem cell rescue 11/23/2020 at [facility]             (a) stem cell reinfusion 11/24/2020             (b) white cell engraftment 12/05/2020, platelet engraftment 12/11/2020             (c) Day +100 bone marrow evaluation normocellular with no increase in plasma cells, however MRD positive.              (d) PET 03/09/2021 showed no FDG-avid bone or other disease; increased tracer uptake in left atrial wall led to cardiac consultation   14) maintenance bortezomib indicated             (a) start of maintenance therapy delayed by post-transplantCHF                         (i) echo 02/2021 showed an ejection fraction in the 20-25% range, following with Dr. Beau Fanny  Interval History:  Ammie Dalton 60 y.o. female with medical history significant for multiple myeloma in remission  status post stem cell transplant who presents for a follow up visit. The patient's last visit was on 07/16/2021. In the interim since the last visit she has continued on every 2 week bortezomib maintenance therapy.  On exam today Mrs. Seiler reports she has been having difficulty lately.  She had an eczema flare and has been more nauseated than usual.  She reports that "all I do is work and sleep".  She is also recently come off of steroid therapy she believes this is resulted in a more depressed mood.  She also notes that at the same time she attempted to decrease her dosage of antidepressants.  She notes  that her energy levels are low and she is having some bone pain.  She denies any numbness or tingling or diarrhea with the bortezomib shots.  Other than her depressed mood and current eczema flare she has had no other major changes in her health.  She otherwise denies any fevers, chills, sweats, nausea, vomiting or diarrhea.  Full 10 point ROS is listed below.  MEDICAL HISTORY:  Past Medical History:  Diagnosis Date   Hypertension    Kidney stones    Kidney stones     SURGICAL HISTORY: Past Surgical History:  Procedure Laterality Date   CESAREAN SECTION     CYSTOSCOPY WITH RETROGRADE PYELOGRAM, URETEROSCOPY AND STENT PLACEMENT Right 09/16/2015   Procedure: CYSTOSCOPY WITH RETROGRADE PYELOGRAM, URETEROSCOPY AND STENT PLACEMENT WITH HOLMIUM LASER ABLATION ;  Surgeon: Alexis Frock, MD;  Location: [facility];  Service: Urology;  Laterality: Right;   CYSTOSCOPY/RETROGRADE/URETEROSCOPY  06/04/2012   Procedure: CYSTOSCOPY/RETROGRADE/URETEROSCOPY;  Surgeon: Ailene Rud, MD;  Location: [facility];  Service: Urology;  Laterality: Left;   IR RADIOLOGIST EVAL & MGMT  04/29/2020   UTERINE FIBROID EMBOLIZATION      SOCIAL HISTORY: Social History   Socioeconomic History   Marital status: Married    Spouse name: Not on file   Number of children: Not on file   Years of education: Not on file   Highest education level: Not on file  Occupational History   Not on file  Tobacco Use   Smoking status: Never   Smokeless tobacco: Never  Substance and Sexual Activity   Alcohol use: No   Drug use: No   Sexual activity: Never  Other Topics Concern   Not on file  Social History Narrative   Not on file   Social Determinants of Health   Financial Resource Strain: Not on file  Food Insecurity: Not on file  Transportation Needs: Not on file  Physical Activity: Not on file  Stress: Not on file  Social Connections: Not on file  Intimate Partner Violence: Not on file    FAMILY HISTORY: Family  History  Problem Relation Age of Onset   Breast cancer Neg Hx     ALLERGIES:  is allergic to ace inhibitors, revlimid [lenalidomide], other, sulfa drugs cross reactors, and sulfamethoxazole.  MEDICATIONS:  Current Outpatient Medications  Medication Sig Dispense Refill   acyclovir (ZOVIRAX) 400 MG tablet Take 1 tablet (400 mg total) by mouth 2 (two) times daily. 180 tablet 3   albuterol (VENTOLIN HFA) 108 (90 Base) MCG/ACT inhaler Inhale 2 puffs into the lungs every 4 (four) hours as needed.     BREO ELLIPTA 200-25 MCG/ACT AEPB Inhale 1 puff into the lungs daily.     calcium carbonate (OS-CAL - DOSED IN MG OF ELEMENTAL CALCIUM) 1250 MG tablet Take 1 tablet by mouth daily.  clobetasol cream (TEMOVATE) 0.05 %      clonazePAM (KLONOPIN) 0.5 MG tablet Take 0.5 mg by mouth 2 (two) times daily as needed.     ENTRESTO 24-26 MG Take 1 tablet by mouth 2 (two) times daily.     hydrocortisone 2.5 % ointment Apply to the eczema rash on the face once or twice daily as needed.     hydrOXYzine (ATARAX/VISTARIL) 10 MG tablet Take 1 tablet (10 mg total) by mouth every 4 (four) hours as needed for anxiety. 30 tablet 0   JARDIANCE 10 MG TABS tablet Take 10 mg by mouth daily.     metoprolol succinate (TOPROL-XL) 25 MG 24 hr tablet Take 12.5 mg by mouth daily.     Multiple Vitamins-Minerals (MULTIVITAMIN PO) Take 1 tablet by mouth daily.      predniSONE (DELTASONE) 5 MG tablet 1 tablet     prochlorperazine (COMPAZINE) 5 MG tablet Take 2 tablets (10 mg total) by mouth every 6 (six) hours as needed for nausea or vomiting. 30 tablet 1   sertraline (ZOLOFT) 50 MG tablet Take 75 mg by mouth daily.     spironolactone (ALDACTONE) 25 MG tablet Take 25 mg by mouth daily.     traZODone (DESYREL) 50 MG tablet Take 50 mg by mouth at bedtime as needed.     folic acid (FOLVITE) 1 MG tablet Take 1 tablet (1 mg total) by mouth daily. 90 tablet 3   No current facility-administered medications for this visit.    REVIEW  OF SYSTEMS:   Constitutional: ( - ) fevers, ( - )  chills , ( - ) night sweats Eyes: ( - ) blurriness of vision, ( - ) double vision, ( - ) watery eyes Ears, nose, mouth, throat, and face: ( - ) mucositis, ( - ) sore throat Respiratory: ( - ) cough, ( - ) dyspnea, ( - ) wheezes Cardiovascular: ( - ) palpitation, ( - ) chest discomfort, ( - ) lower extremity swelling Gastrointestinal:  ( - ) nausea, ( - ) heartburn, ( - ) change in bowel habits Skin: ( - ) abnormal skin rashes Lymphatics: ( - ) new lymphadenopathy, ( - ) easy bruising Neurological: ( - ) numbness, ( - ) tingling, ( - ) new weaknesses Behavioral/Psych: ( - ) mood change, ( - ) new changes  All other systems were reviewed with the patient and are negative.  PHYSICAL EXAMINATION: ECOG PERFORMANCE STATUS: 1 - Symptomatic but completely ambulatory  Vitals:   08/13/21 0844  BP: 101/74  Pulse: (!) 101  Resp: 18  Temp: 97.6 F (36.4 C)  SpO2: 96%    Filed Weights   08/13/21 0844  Weight: 133 lb 14.4 oz (60.7 kg)     GENERAL: Well-appearing middle-aged Caucasian female alert, no distress and comfortable SKIN: skin color, texture, turgor are normal, no rashes or significant lesions EYES: conjunctiva are pink and non-injected, sclera clear LUNGS: clear to auscultation and percussion with normal breathing effort HEART: regular rate & rhythm and no murmurs and no lower extremity edema Musculoskeletal: no cyanosis of digits and no clubbing  PSYCH: alert & oriented x 3, fluent speech NEURO: no focal motor/sensory deficits  LABORATORY DATA:  I have reviewed the data as listed CBC Latest Ref Rng & Units 08/13/2021 07/30/2021 07/16/2021  WBC 4.0 - 10.5 K/uL 9.6 8.1 9.0  Hemoglobin 12.0 - 15.0 g/dL 15.8(H) 14.4 13.9  Hematocrit 36.0 - 46.0 % 45.8 42.5 41.3  Platelets 150 - 400 K/uL  284 272 239    CMP Latest Ref Rng & Units 08/13/2021 07/30/2021 07/16/2021  Glucose 70 - 99 mg/dL 95 89 95  BUN 6 - 20 mg/dL 16 19 18    Creatinine 0.44 - 1.00 mg/dL 0.87 0.76 0.76  Sodium 135 - 145 mmol/L 142 142 142  Potassium 3.5 - 5.1 mmol/L 4.4 4.4 3.4(L)  Chloride 98 - 111 mmol/L 105 108 107  CO2 22 - 32 mmol/L 27 26 26   Calcium 8.9 - 10.3 mg/dL 9.4 8.9 8.9  Total Protein 6.5 - 8.1 g/dL 6.9 6.3(L) 6.0(L)  Total Bilirubin 0.3 - 1.2 mg/dL 0.4 0.3 <0.2(L)  Alkaline Phos 38 - 126 U/L 99 85 63  AST 15 - 41 U/L 17 15 15   ALT 0 - 44 U/L 17 17 18     Lab Results  Component Value Date   MPROTEIN Not Observed 07/16/2021   MPROTEIN Not Observed 06/17/2021   MPROTEIN 0.1 (H) 01/19/2021   Lab Results  Component Value Date   KPAFRELGTCHN 5.8 07/16/2021   KPAFRELGTCHN 6.2 06/17/2021   KPAFRELGTCHN 6.0 04/19/2021   LAMBDASER 2.3 (L) 07/16/2021   LAMBDASER 2.6 (L) 06/17/2021   LAMBDASER <1.5 (L) 04/19/2021   KAPLAMBRATIO 2.52 (H) 07/16/2021   KAPLAMBRATIO 2.38 (H) 06/17/2021   KAPLAMBRATIO >4.00 (H) 04/19/2021    RADIOGRAPHIC STUDIES: No results found.  ASSESSMENT & PLAN YULIYA NOVA 60 y.o. female with medical history significant for multiple myeloma in remission status post stem cell transplant who presents for a follow up visit.  After review of labs, review the records, discussion with the patient her findings are most consistent with multiple myeloma in remission status post autosomal stem cell transplant at [facility].  She was treated there by Dr. Quentin Ore.  Unfortunately during the course of her treatment she was found to be intolerant to Revlimid due to rash/hives.  As such she is been on maintenance with bortezomib shots every 2 weeks.  We will continue this therapy moving forward as we assume her care from Dr. Jana Hakim.  At this time her M protein is undetectable and her blood counts are stable.  # Multiple Myeloma in Remission s/p ASCT -- We will continue the patient's current maintenance plan of bortezomib subcutaneous every 2 weeks as maintenance therapy. -- Patient is not a candidate  for Revlimid maintenance therapy as she developed hives/rash with this treatment --Defer to [facility] for administration of vaccines and vaccine timeline --We will continue every 4 week visits with restaging labs and interval every 2 week bortezomib treatments.   #Supportive Care -- zofran 35m q8H PRN and compazine 180mPO q6H for nausea -- acyclovir 40099mID PO for VCZ prophylaxis -- zometa q 3 monthly infusions x 2 years.  -- No pain medication required at this time.   No orders of the defined types were placed in this encounter.   All questions were answered. The patient knows to call the clinic with any problems, questions or concerns.  A total of more than 30 minutes were spent on this encounter with face-to-face time and non-face-to-face time, including preparing to see the patient, ordering tests and/or medications, counseling the patient and coordination of care as outlined above.   JohLedell PeoplesD Department of Hematology/Oncology [facility]: [phone]: [phone]: [email or web address]  08/13/2021 9:42 AM

## 2021-08-13 NOTE — Patient Instructions (Signed)
[facility] ONCOL[facility]  Discharge Instructions: Thank you for choosing [facility] to provide your oncology and hematology care.   If you have a lab appointment with the [facility], please go directly to the [facility] and check in at the registration area.   Wear comfortable clothing and clothing appropriate for easy access to any Portacath or PICC line.   We strive to give you quality time with your provider. You may need to reschedule your appointment if you arrive late (15 or more minutes).  Arriving late affects you and other patients whose appointments are after yours.  Also, if you miss three or more appointments without notifying the office, you may be dismissed from the clinic at the providers discretion.      For prescription refill requests, have your pharmacy contact our office and allow 72 hours for refills to be completed.    Today you received the following chemotherapy and/or immunotherapy agents: velcade      To help prevent nausea and vomiting after your treatment, we encourage you to take your nausea medication as directed.  BELOW ARE SYMPTOMS THAT SHOULD BE REPORTED IMMEDIATELY: *FEVER GREATER THAN 100.4 F (38 C) OR HIGHER *CHILLS OR SWEATING *NAUSEA AND VOMITING THAT IS NOT CONTROLLED WITH YOUR NAUSEA MEDICATION *UNUSUAL SHORTNESS OF BREATH *UNUSUAL BRUISING OR BLEEDING *URINARY PROBLEMS (pain or burning when urinating, or frequent urination) *BOWEL PROBLEMS (unusual diarrhea, constipation, pain near the anus) TENDERNESS IN MOUTH AND THROAT WITH OR WITHOUT PRESENCE OF ULCERS (sore throat, sores in mouth, or a toothache) UNUSUAL RASH, SWELLING OR PAIN  UNUSUAL VAGINAL DISCHARGE OR ITCHING   Items with * indicate a potential emergency and should be followed up as soon as possible or go to the Emergency Department if any problems should occur.  Please show the CHEMOTHERAPY ALERT CARD or IMMUNOTHERAPY ALERT CARD at check-in to  the Emergency Department and triage nurse.  Should you have questions after your visit or need to cancel or reschedule your appointment, please contact [facility]  Dept: [phone]  and follow the prompts.  Office hours are 8:00 a.m. to 4:30 p.m. Monday - Friday. Please note that voicemails left after 4:00 p.m. may not be returned until the following business day.  We are closed weekends and major holidays. You have access to a nurse at all times for urgent questions. Please call the main number to the clinic Dept: [phone] and follow the prompts.   For any non-urgent questions, you may also contact your provider using MyChart. We now offer e-Visits for anyone 84 and older to request care online for non-urgent symptoms. For details visit [email or web address].   Also download the MyChart app! Go to the app store, search "MyChart", open the app, select [facility], and log in with your MyChart username and password.  Due to Covid, a mask is required upon entering the hospital/clinic. If you do not have a mask, one will be given to you upon arrival. For doctor visits, patients may have 1 support person aged 18 or older with them. For treatment visits, patients cannot have anyone with them due to current Covid guidelines and our immunocompromised population.

## 2021-08-16 LAB — KAPPA/LAMBDA LIGHT CHAINS
Kappa free light chain: 7.9 mg/L (ref 3.3–19.4)
Kappa, lambda light chain ratio: 1.3 (ref 0.26–1.65)
Lambda free light chains: 6.1 mg/L (ref 5.7–26.3)

## 2021-08-19 LAB — MULTIPLE MYELOMA PANEL, SERUM
Albumin SerPl Elph-Mcnc: 3.6 g/dL (ref 2.9–4.4)
Albumin/Glob SerPl: 1.6 (ref 0.7–1.7)
Alpha 1: 0.2 g/dL (ref 0.0–0.4)
Alpha2 Glob SerPl Elph-Mcnc: 0.9 g/dL (ref 0.4–1.0)
B-Globulin SerPl Elph-Mcnc: 0.9 g/dL (ref 0.7–1.3)
Gamma Glob SerPl Elph-Mcnc: 0.4 g/dL (ref 0.4–1.8)
Globulin, Total: 2.4 g/dL (ref 2.2–3.9)
IgA: 11 mg/dL — ABNORMAL LOW (ref 87–352)
IgG (Immunoglobin G), Serum: 452 mg/dL — ABNORMAL LOW (ref 586–1602)
IgM (Immunoglobulin M), Srm: 41 mg/dL (ref 26–217)
Total Protein ELP: 6 g/dL (ref 6.0–8.5)

## 2021-08-27 ENCOUNTER — Inpatient Hospital Stay: Payer: 59

## 2021-08-27 ENCOUNTER — Other Ambulatory Visit: Payer: Self-pay

## 2021-08-27 VITALS — BP 113/80 | HR 67 | Temp 98.4°F | Resp 18 | Ht 69.0 in | Wt 135.8 lb

## 2021-08-27 DIAGNOSIS — C9000 Multiple myeloma not having achieved remission: Secondary | ICD-10-CM

## 2021-08-27 DIAGNOSIS — Z5112 Encounter for antineoplastic immunotherapy: Secondary | ICD-10-CM | POA: Diagnosis not present

## 2021-08-27 LAB — CBC WITH DIFFERENTIAL (CANCER CENTER ONLY)
Abs Immature Granulocytes: 0.09 10*3/uL — ABNORMAL HIGH (ref 0.00–0.07)
Basophils Absolute: 0.1 10*3/uL (ref 0.0–0.1)
Basophils Relative: 1 %
Eosinophils Absolute: 0.9 10*3/uL — ABNORMAL HIGH (ref 0.0–0.5)
Eosinophils Relative: 11 %
HCT: 43.4 % (ref 36.0–46.0)
Hemoglobin: 14.5 g/dL (ref 12.0–15.0)
Immature Granulocytes: 1 %
Lymphocytes Relative: 18 %
Lymphs Abs: 1.6 10*3/uL (ref 0.7–4.0)
MCH: 33 pg (ref 26.0–34.0)
MCHC: 33.4 g/dL (ref 30.0–36.0)
MCV: 98.6 fL (ref 80.0–100.0)
Monocytes Absolute: 0.9 10*3/uL (ref 0.1–1.0)
Monocytes Relative: 10 %
Neutro Abs: 5.2 10*3/uL (ref 1.7–7.7)
Neutrophils Relative %: 59 %
Platelet Count: 305 10*3/uL (ref 150–400)
RBC: 4.4 MIL/uL (ref 3.87–5.11)
RDW: 12.2 % (ref 11.5–15.5)
WBC Count: 8.7 10*3/uL (ref 4.0–10.5)
nRBC: 0 % (ref 0.0–0.2)

## 2021-08-27 LAB — CMP (CANCER CENTER ONLY)
ALT: 14 U/L (ref 0–44)
AST: 14 U/L — ABNORMAL LOW (ref 15–41)
Albumin: 4.2 g/dL (ref 3.5–5.0)
Alkaline Phosphatase: 68 U/L (ref 38–126)
Anion gap: 8 (ref 5–15)
BUN: 18 mg/dL (ref 6–20)
CO2: 30 mmol/L (ref 22–32)
Calcium: 9.4 mg/dL (ref 8.9–10.3)
Chloride: 103 mmol/L (ref 98–111)
Creatinine: 0.77 mg/dL (ref 0.44–1.00)
GFR, Estimated: >60 mL/min (ref >60)
Glucose, Bld: 73 mg/dL (ref 70–99)
Potassium: 3.7 mmol/L (ref 3.5–5.1)
Sodium: 141 mmol/L (ref 135–145)
Total Bilirubin: 0.4 mg/dL (ref 0.3–1.2)
Total Protein: 6.4 g/dL — ABNORMAL LOW (ref 6.5–8.1)

## 2021-08-27 LAB — LACTATE DEHYDROGENASE: LDH: 183 U/L (ref 98–192)

## 2021-08-27 MED ORDER — BORTEZOMIB CHEMO SQ INJECTION 3.5 MG (2.5MG/ML)
1.0000 mg/m2 | Freq: Once | INTRAMUSCULAR | Status: AC
  Administered 2021-08-27: 15:00:00 1.75 mg via SUBCUTANEOUS
  Filled 2021-08-27: qty 0.7

## 2021-08-27 MED ORDER — PROCHLORPERAZINE MALEATE 10 MG PO TABS
10.0000 mg | ORAL_TABLET | Freq: Once | ORAL | Status: AC
  Administered 2021-08-27: 14:00:00 10 mg via ORAL
  Filled 2021-08-27: qty 1

## 2021-08-27 NOTE — Patient Instructions (Signed)
[facility]   Discharge Instructions: Thank you for choosing [facility] to provide your oncology and hematology care.   If you have a lab appointment with the Cancer Center, please go directly to the Cancer Center and check in at the registration area.   Wear comfortable clothing and clothing appropriate for easy access to any Portacath or PICC line.   We strive to give you quality time with your provider. You may need to reschedule your appointment if you arrive late (15 or more minutes).  Arriving late affects you and other patients whose appointments are after yours.  Also, if you miss three or more appointments without notifying the office, you may be dismissed from the clinic at the provider's discretion.      For prescription refill requests, have your pharmacy contact our office and allow 72 hours for refills to be completed.    Today you received the following chemotherapy and/or immunotherapy agents: Bortezomib (Velcade)      To help prevent nausea and vomiting after your treatment, we encourage you to take your nausea medication as directed.  BELOW ARE SYMPTOMS THAT SHOULD BE REPORTED IMMEDIATELY: *FEVER GREATER THAN 100.4 F (38 C) OR HIGHER *CHILLS OR SWEATING *NAUSEA AND VOMITING THAT IS NOT CONTROLLED WITH YOUR NAUSEA MEDICATION *UNUSUAL SHORTNESS OF BREATH *UNUSUAL BRUISING OR BLEEDING *URINARY PROBLEMS (pain or burning when urinating, or frequent urination) *BOWEL PROBLEMS (unusual diarrhea, constipation, pain near the anus) TENDERNESS IN MOUTH AND THROAT WITH OR WITHOUT PRESENCE OF ULCERS (sore throat, sores in mouth, or a toothache) UNUSUAL RASH, SWELLING OR PAIN  UNUSUAL VAGINAL DISCHARGE OR ITCHING   Items with * indicate a potential emergency and should be followed up as soon as possible or go to the Emergency Department if any problems should occur.  Please show the CHEMOTHERAPY ALERT CARD or IMMUNOTHERAPY ALERT CARD at  check-in to the Emergency Department and triage nurse.  Should you have questions after your visit or need to cancel or reschedule your appointment, please contact [facility]  Dept: [phone]  and follow the prompts.  Office hours are 8:00 a.m. to 4:30 p.m. Monday - Friday. Please note that voicemails left after 4:00 p.m. may not be returned until the following business day.  We are closed weekends and major holidays. You have access to a nurse at all times for urgent questions. Please call the main number to the clinic Dept: [phone] and follow the prompts.   For any non-urgent questions, you may also contact your provider using MyChart. We now offer e-Visits for anyone 18 and older to request care online for non-urgent symptoms. For details visit [email or web address].   Also download the MyChart app! Go to the app store, search "MyChart", open the app, select [facility], and log in with your MyChart username and password.  Due to Covid, a mask is required upon entering the hospital/clinic. If you do not have a mask, one will be given to you upon arrival. For doctor visits, patients may have 1 support person aged 18 or older with them. For treatment visits, patients cannot have anyone with them due to current Covid guidelines and our immunocompromised population.   

## 2021-09-10 ENCOUNTER — Inpatient Hospital Stay: Payer: 59 | Attending: Oncology

## 2021-09-10 ENCOUNTER — Other Ambulatory Visit: Payer: Self-pay

## 2021-09-10 ENCOUNTER — Inpatient Hospital Stay (HOSPITAL_BASED_OUTPATIENT_CLINIC_OR_DEPARTMENT_OTHER): Payer: 59 | Admitting: Hematology and Oncology

## 2021-09-10 ENCOUNTER — Inpatient Hospital Stay: Payer: 59

## 2021-09-10 ENCOUNTER — Other Ambulatory Visit: Payer: Self-pay | Admitting: Hematology and Oncology

## 2021-09-10 VITALS — BP 110/75 | HR 70 | Temp 98.0°F | Resp 16 | Ht 69.0 in | Wt 136.8 lb

## 2021-09-10 DIAGNOSIS — C9000 Multiple myeloma not having achieved remission: Secondary | ICD-10-CM | POA: Diagnosis not present

## 2021-09-10 DIAGNOSIS — G620 Drug-induced polyneuropathy: Secondary | ICD-10-CM

## 2021-09-10 DIAGNOSIS — C9001 Multiple myeloma in remission: Secondary | ICD-10-CM | POA: Insufficient documentation

## 2021-09-10 DIAGNOSIS — Z48298 Encounter for aftercare following other organ transplant: Secondary | ICD-10-CM | POA: Diagnosis not present

## 2021-09-10 DIAGNOSIS — T451X5A Adverse effect of antineoplastic and immunosuppressive drugs, initial encounter: Secondary | ICD-10-CM | POA: Diagnosis not present

## 2021-09-10 DIAGNOSIS — Z79899 Other long term (current) drug therapy: Secondary | ICD-10-CM | POA: Diagnosis not present

## 2021-09-10 DIAGNOSIS — Z5112 Encounter for antineoplastic immunotherapy: Secondary | ICD-10-CM | POA: Insufficient documentation

## 2021-09-10 LAB — CBC WITH DIFFERENTIAL (CANCER CENTER ONLY)
Abs Immature Granulocytes: 0.04 10*3/uL (ref 0.00–0.07)
Basophils Absolute: 0.1 10*3/uL (ref 0.0–0.1)
Basophils Relative: 1 %
Eosinophils Absolute: 0.8 10*3/uL — ABNORMAL HIGH (ref 0.0–0.5)
Eosinophils Relative: 8 %
HCT: 42.4 % (ref 36.0–46.0)
Hemoglobin: 14.3 g/dL (ref 12.0–15.0)
Immature Granulocytes: 0 %
Lymphocytes Relative: 17 %
Lymphs Abs: 1.6 10*3/uL (ref 0.7–4.0)
MCH: 32.8 pg (ref 26.0–34.0)
MCHC: 33.7 g/dL (ref 30.0–36.0)
MCV: 97.2 fL (ref 80.0–100.0)
Monocytes Absolute: 0.8 10*3/uL (ref 0.1–1.0)
Monocytes Relative: 9 %
Neutro Abs: 6 10*3/uL (ref 1.7–7.7)
Neutrophils Relative %: 65 %
Platelet Count: 245 10*3/uL (ref 150–400)
RBC: 4.36 MIL/uL (ref 3.87–5.11)
RDW: 12.4 % (ref 11.5–15.5)
WBC Count: 9.3 10*3/uL (ref 4.0–10.5)
nRBC: 0 % (ref 0.0–0.2)

## 2021-09-10 LAB — CMP (CANCER CENTER ONLY)
ALT: 16 U/L (ref 0–44)
AST: 15 U/L (ref 15–41)
Albumin: 4.3 g/dL (ref 3.5–5.0)
Alkaline Phosphatase: 69 U/L (ref 38–126)
Anion gap: 6 (ref 5–15)
BUN: 21 mg/dL — ABNORMAL HIGH (ref 6–20)
CO2: 31 mmol/L (ref 22–32)
Calcium: 9.7 mg/dL (ref 8.9–10.3)
Chloride: 103 mmol/L (ref 98–111)
Creatinine: 0.87 mg/dL (ref 0.44–1.00)
GFR, Estimated: >60 mL/min (ref >60)
Glucose, Bld: 78 mg/dL (ref 70–99)
Potassium: 3.6 mmol/L (ref 3.5–5.1)
Sodium: 140 mmol/L (ref 135–145)
Total Bilirubin: 0.4 mg/dL (ref 0.3–1.2)
Total Protein: 6.6 g/dL (ref 6.5–8.1)

## 2021-09-10 LAB — LACTATE DEHYDROGENASE: LDH: 193 U/L — ABNORMAL HIGH (ref 98–192)

## 2021-09-10 MED ORDER — PROCHLORPERAZINE MALEATE 10 MG PO TABS
10.0000 mg | ORAL_TABLET | Freq: Once | ORAL | Status: AC
  Administered 2021-09-10: 10 mg via ORAL
  Filled 2021-09-10: qty 1

## 2021-09-10 MED ORDER — SERTRALINE HCL 50 MG PO TABS: 75.0000 mg | ORAL_TABLET | Freq: Every day | ORAL | 1 refills | Status: AC

## 2021-09-10 MED ORDER — BORTEZOMIB CHEMO SQ INJECTION 3.5 MG (2.5MG/ML)
1.0000 mg/m2 | Freq: Once | INTRAMUSCULAR | Status: AC
  Administered 2021-09-10: 1.75 mg via SUBCUTANEOUS
  Filled 2021-09-10: qty 0.7

## 2021-09-10 NOTE — Patient Instructions (Signed)
[facility]  Discharge Instructions: Thank you for choosing [facility] to provide your oncology and hematology care.   If you have a lab appointment with the Cancer Center, please go directly to the Cancer Center and check in at the registration area.   Wear comfortable clothing and clothing appropriate for easy access to any Portacath or PICC line.   We strive to give you quality time with your provider. You may need to reschedule your appointment if you arrive late (15 or more minutes).  Arriving late affects you and other patients whose appointments are after yours.  Also, if you miss three or more appointments without notifying the office, you may be dismissed from the clinic at the provider's discretion.      For prescription refill requests, have your pharmacy contact our office and allow 72 hours for refills to be completed.    Today you received the following chemotherapy and/or immunotherapy agents Velcade       To help prevent nausea and vomiting after your treatment, we encourage you to take your nausea medication as directed.  BELOW ARE SYMPTOMS THAT SHOULD BE REPORTED IMMEDIATELY: *FEVER GREATER THAN 100.4 F (38 C) OR HIGHER *CHILLS OR SWEATING *NAUSEA AND VOMITING THAT IS NOT CONTROLLED WITH YOUR NAUSEA MEDICATION *UNUSUAL SHORTNESS OF BREATH *UNUSUAL BRUISING OR BLEEDING *URINARY PROBLEMS (pain or burning when urinating, or frequent urination) *BOWEL PROBLEMS (unusual diarrhea, constipation, pain near the anus) TENDERNESS IN MOUTH AND THROAT WITH OR WITHOUT PRESENCE OF ULCERS (sore throat, sores in mouth, or a toothache) UNUSUAL RASH, SWELLING OR PAIN  UNUSUAL VAGINAL DISCHARGE OR ITCHING   Items with * indicate a potential emergency and should be followed up as soon as possible or go to the Emergency Department if any problems should occur.  Please show the CHEMOTHERAPY ALERT CARD or IMMUNOTHERAPY ALERT CARD at check-in to  the Emergency Department and triage nurse.  Should you have questions after your visit or need to cancel or reschedule your appointment, please contact [facility]  Dept: [phone]  and follow the prompts.  Office hours are 8:00 a.m. to 4:30 p.m. Monday - Friday. Please note that voicemails left after 4:00 p.m. may not be returned until the following business day.  We are closed weekends and major holidays. You have access to a nurse at all times for urgent questions. Please call the main number to the clinic Dept: [phone] and follow the prompts.   For any non-urgent questions, you may also contact your provider using MyChart. We now offer e-Visits for anyone 18 and older to request care online for non-urgent symptoms. For details visit [email or web address].   Also download the MyChart app! Go to the app store, search "MyChart", open the app, select [facility], and log in with your MyChart username and password.  Due to Covid, a mask is required upon entering the hospital/clinic. If you do not have a mask, one will be given to you upon arrival. For doctor visits, patients may have 1 support person aged 18 or older with them. For treatment visits, patients cannot have anyone with them due to current Covid guidelines and our immunocompromised population.   

## 2021-09-10 NOTE — Progress Notes (Signed)
[facility] [phone]   [phone]  PROGRESS NOTE  Patient Care Team: Maurice Small, MD as PCP - General (Family Medicine) Magrinat, Virgie Dad, MD as Consulting Physician (Oncology) Latanya Maudlin, MD as Consulting Physician (Orthopedic Surgery) Everlene Farrier, MD as Consulting Physician (Obstetrics and Gynecology) Domingo Pulse, MD (Urology) Ronald Lobo, MD as Consulting Physician (Gastroenterology) Markus Daft, MD as Consulting Physician (Interventional Radiology) Erline Levine, MD as Consulting Physician (Neurosurgery) Aris Lot Ramon Dredge, MD as Referring Physician (Hematology and Oncology) Milda Smart, MD as Referring Physician (Diagnostic Radiology)  Hematological/Oncological History # Multiple Myeloma in Remission s/p Bone Marrow Transplant  1) 02/22/2020: presented with Low Back Pain. MRI concerning for wedge fractures/compression fractures  2) 03/05/2020 Myeloma panel shows no M protein but all immunoglobulins are decreased.  The lambda ratio is markedly abnormal at 131.34; review of the blood film is not diagnostic 3) 24-hour urine: markedly elevated Kappa light chains at 9,656; urine kappa/lambda ratio 4180.24, and urine M spike of 239. 4)  bone survey on 03/16/2020 shows rounded lucencies throughout the skull which could be venous lakes  5)  bone marrow biopsy on 7/27 confirms multiple myeloma with 44% plasma cells on the aspirate by manual differential, and 70 to 80% on the biopsy by CD138 immunohistochemistry 6) cytogenetics  03/24/2020: no metaphase cells available for analysis  7)  myeloma FISH studies 03/24/2020 show t(11,14), with NO del(17p), t(1/14) or t(14.16) 8)  on 04/08/2020 LDH 240, beta-2-microglobulin 4.4, albumin 4.2 9)  R-ISS Stage: II    10) bortezomib/dexamethasone started 03/31/2020, most recent dose 10/28/2020             (a) lenalidomide added on 04/21/2020, stopped on 8/26 after two doses secondary to  rash, resumed 04/29/2020 again with a very poor tolerance, treated with Medrol Dosepak, third attempt 05/14/2020, again unable to tolerate              (b) thalidomide added 06/04/2020 to bortezomib/ dexamethasone 05/27/2020, discontinued 06/24/2020 with rash             (c) daratumumab added 07/08/2020, most recent dose 10/21/2020              11) denosumab/Xgeva started 05/06/2020, repeated every 3 months   12) s/p kyphoplasty to T7, T11 and T12 on 06/09/2020 at [facility]   13) s/p high-dose therapy with autologous stem cell rescue 11/23/2020 at [facility] at [facility]             (a) stem cell reinfusion 11/24/2020             (b) white cell engraftment 12/05/2020, platelet engraftment 12/11/2020             (c) Day +100 bone marrow evaluation normocellular with no increase in plasma cells, however MRD positive.              (d) PET 03/09/2021 showed no FDG-avid bone or other disease; increased tracer uptake in left atrial wall led to cardiac consultation   14) maintenance bortezomib indicated             (a) start of maintenance therapy delayed by post-transplantCHF                         (i) echo 02/2021 showed an ejection fraction in the 20-25% range, following with Dr. Beau Fanny  Interval History:  Robin Wilson 61 y.o. female with medical history significant for multiple myeloma in remission  status post stem cell transplant who presents for a follow up visit. The patient's last visit was on 08/13/2021. In the interim since the last visit she has continued on every 2 week bortezomib maintenance therapy.  On exam today [name] reports she is been well overall in the interim since her last visit.  She is notes no major changes in her health.  Her eczema has been under better control.  She notes that she is not having any nausea, vomiting, or dark stools.  She notes some occasional loose stools but no frank diarrhea.  She is been tolerating her potassium shots well without any difficulty.  She  denies any rash, itching, or pain at the site of the injection.  She otherwise denies any fevers, chills, sweats, nausea, vomiting or diarrhea.  Full 10 point ROS is listed below.  MEDICAL HISTORY:  Past Medical History:  Diagnosis Date   Hypertension    Kidney stones    Kidney stones     SURGICAL HISTORY: Past Surgical History:  Procedure Laterality Date   CESAREAN SECTION     CYSTOSCOPY WITH RETROGRADE PYELOGRAM, URETEROSCOPY AND STENT PLACEMENT Right 09/16/2015   Procedure: CYSTOSCOPY WITH RETROGRADE PYELOGRAM, URETEROSCOPY AND STENT PLACEMENT WITH HOLMIUM LASER ABLATION ;  Surgeon: Alexis Frock, MD;  Location: [facility];  Service: Urology;  Laterality: Right;   CYSTOSCOPY/RETROGRADE/URETEROSCOPY  06/04/2012   Procedure: CYSTOSCOPY/RETROGRADE/URETEROSCOPY;  Surgeon: Ailene Rud, MD;  Location: [facility];  Service: Urology;  Laterality: Left;   IR RADIOLOGIST EVAL & MGMT  04/29/2020   UTERINE FIBROID EMBOLIZATION      SOCIAL HISTORY: Social History   Socioeconomic History   Marital status: Married    Spouse name: Not on file   Number of children: Not on file   Years of education: Not on file   Highest education level: Not on file  Occupational History   Not on file  Tobacco Use   Smoking status: Never   Smokeless tobacco: Never  Substance and Sexual Activity   Alcohol use: No   Drug use: No   Sexual activity: Never  Other Topics Concern   Not on file  Social History Narrative   Not on file   Social Determinants of Health   Financial Resource Strain: Not on file  Food Insecurity: Not on file  Transportation Needs: Not on file  Physical Activity: Not on file  Stress: Not on file  Social Connections: Not on file  Intimate Partner Violence: Not on file    FAMILY HISTORY: Family History  Problem Relation Age of Onset   Breast cancer Neg Hx     ALLERGIES:  is allergic to ace inhibitors, revlimid [lenalidomide], other, sulfa drugs cross reactors, and  sulfamethoxazole.  MEDICATIONS:  Current Outpatient Medications  Medication Sig Dispense Refill   acyclovir (ZOVIRAX) 400 MG tablet Take 1 tablet (400 mg total) by mouth 2 (two) times daily. 180 tablet 3   albuterol (VENTOLIN HFA) 108 (90 Base) MCG/ACT inhaler Inhale 2 puffs into the lungs every 4 (four) hours as needed.     BREO ELLIPTA 200-25 MCG/ACT AEPB Inhale 1 puff into the lungs daily.     calcium carbonate (OS-CAL - DOSED IN MG OF ELEMENTAL CALCIUM) 1250 MG tablet Take 1 tablet by mouth daily.       clobetasol cream (TEMOVATE) 0.05 %      clonazePAM (KLONOPIN) 0.5 MG tablet Take 0.5 mg by mouth 2 (two) times daily as needed.     ENTRESTO 24-26  MG Take 1 tablet by mouth 2 (two) times daily.     folic acid (FOLVITE) 1 MG tablet Take 1 tablet (1 mg total) by mouth daily. 90 tablet 3   hydrocortisone 2.5 % ointment Apply to the eczema rash on the face once or twice daily as needed.     hydrOXYzine (ATARAX/VISTARIL) 10 MG tablet Take 1 tablet (10 mg total) by mouth every 4 (four) hours as needed for anxiety. 30 tablet 0   JARDIANCE 10 MG TABS tablet Take 10 mg by mouth daily.     metoprolol succinate (TOPROL-XL) 25 MG 24 hr tablet Take 12.5 mg by mouth daily.     Multiple Vitamins-Minerals (MULTIVITAMIN PO) Take 1 tablet by mouth daily.      predniSONE (DELTASONE) 5 MG tablet 1 tablet     prochlorperazine (COMPAZINE) 5 MG tablet Take 2 tablets (10 mg total) by mouth every 6 (six) hours as needed for nausea or vomiting. 30 tablet 1   sertraline (ZOLOFT) 50 MG tablet Take 1.5 tablets (75 mg total) by mouth daily. 135 tablet 1   spironolactone (ALDACTONE) 25 MG tablet Take 25 mg by mouth daily.     traZODone (DESYREL) 50 MG tablet Take 50 mg by mouth at bedtime as needed.     No current facility-administered medications for this visit.    REVIEW OF SYSTEMS:   Constitutional: ( - ) fevers, ( - )  chills , ( - ) night sweats Eyes: ( - ) blurriness of vision, ( - ) double vision, ( - )  watery eyes Ears, nose, mouth, throat, and face: ( - ) mucositis, ( - ) sore throat Respiratory: ( - ) cough, ( - ) dyspnea, ( - ) wheezes Cardiovascular: ( - ) palpitation, ( - ) chest discomfort, ( - ) lower extremity swelling Gastrointestinal:  ( - ) nausea, ( - ) heartburn, ( - ) change in bowel habits Skin: ( - ) abnormal skin rashes Lymphatics: ( - ) new lymphadenopathy, ( - ) easy bruising Neurological: ( - ) numbness, ( - ) tingling, ( - ) new weaknesses Behavioral/Psych: ( - ) mood change, ( - ) new changes  All other systems were reviewed with the patient and are negative.  PHYSICAL EXAMINATION: ECOG PERFORMANCE STATUS: 1 - Symptomatic but completely ambulatory  Vitals:   09/10/21 1404  BP: 110/75  Pulse: 70  Resp: 16  Temp: 98 F (36.7 C)  SpO2: 97%    Filed Weights   09/10/21 1404  Weight: 136 lb 12.8 oz (62.1 kg)     GENERAL: Well-appearing middle-aged Caucasian female alert, no distress and comfortable SKIN: skin color, texture, turgor are normal, no rashes or significant lesions EYES: conjunctiva are pink and non-injected, sclera clear LUNGS: clear to auscultation and percussion with normal breathing effort HEART: regular rate & rhythm and no murmurs and no lower extremity edema Musculoskeletal: no cyanosis of digits and no clubbing  PSYCH: alert & oriented x 3, fluent speech NEURO: no focal motor/sensory deficits  LABORATORY DATA:  I have reviewed the data as listed CBC Latest Ref Rng & Units 09/10/2021 08/27/2021 08/13/2021  WBC 4.0 - 10.5 K/uL 9.3 8.7 9.6  Hemoglobin 12.0 - 15.0 g/dL 14.3 14.5 15.8(H)  Hematocrit 36.0 - 46.0 % 42.4 43.4 45.8  Platelets 150 - 400 K/uL 245 305 284    CMP Latest Ref Rng & Units 09/10/2021 08/27/2021 08/13/2021  Glucose 70 - 99 mg/dL 78 73 95  BUN 6 - 20  mg/dL 21(H) 18 16  Creatinine 0.44 - 1.00 mg/dL 0.87 0.77 0.87  Sodium 135 - 145 mmol/L 140 141 142  Potassium 3.5 - 5.1 mmol/L 3.6 3.7 4.4  Chloride 98 - 111 mmol/L  103 103 105  CO2 22 - 32 mmol/L 31 30 27   Calcium 8.9 - 10.3 mg/dL 9.7 9.4 9.4  Total Protein 6.5 - 8.1 g/dL 6.6 6.4(L) 6.9  Total Bilirubin 0.3 - 1.2 mg/dL 0.4 0.4 0.4  Alkaline Phos 38 - 126 U/L 69 68 99  AST 15 - 41 U/L 15 14(L) 17  ALT 0 - 44 U/L 16 14 17     Lab Results  Component Value Date   MPROTEIN Not Observed 08/13/2021   MPROTEIN Not Observed 07/16/2021   MPROTEIN Not Observed 06/17/2021   Lab Results  Component Value Date   KPAFRELGTCHN 7.9 08/13/2021   KPAFRELGTCHN 5.8 07/16/2021   KPAFRELGTCHN 6.2 06/17/2021   LAMBDASER 6.1 08/13/2021   LAMBDASER 2.3 (L) 07/16/2021   LAMBDASER 2.6 (L) 06/17/2021   KAPLAMBRATIO 1.30 08/13/2021   KAPLAMBRATIO 2.52 (H) 07/16/2021   KAPLAMBRATIO 2.38 (H) 06/17/2021    RADIOGRAPHIC STUDIES: No results found.  ASSESSMENT & PLAN BRITTNAY PIGMAN 61 y.o. female with medical history significant for multiple myeloma in remission status post stem cell transplant who presents for a follow up visit.  After review of labs, review the records, discussion with the patient her findings are most consistent with multiple myeloma in remission status post autosomal stem cell transplant at [facility].  She was treated there by Dr. Quentin Ore.  Unfortunately during the course of her treatment she was found to be intolerant to Revlimid due to rash/hives.  As such she is been on maintenance with bortezomib shots every 2 weeks.  We will continue this therapy moving forward as we assume her care from Dr. Jana Hakim.  At this time her M protein is undetectable and her blood counts are stable.  Last MM staging labs on 08/13/2021 showed M protein undetectable and kappa 7.9, Lambda 6.1 with ratio of 1.3.   # Multiple Myeloma in Remission s/p ASCT -- We will continue the patient's current maintenance plan of bortezomib subcutaneous every 2 weeks as maintenance therapy. -- Patient is not a candidate for Revlimid maintenance therapy as she developed  hives/rash with this treatment --Defer to [facility] for administration of vaccines and vaccine timeline --We will continue every 4 week visits with restaging labs and interval every 2 week bortezomib treatments.   #Supportive Care -- zofran 54m q8H PRN and compazine 111mPO q6H for nausea -- acyclovir 40034mID PO for VCZ prophylaxis -- zometa q 3 monthly infusions x 2 years. Last infusion in Nov 2022.  -- No pain medication required at this time.   No orders of the defined types were placed in this encounter.  All questions were answered. The patient knows to call the clinic with any problems, questions or concerns.  A total of more than 30 minutes were spent on this encounter with face-to-face time and non-face-to-face time, including preparing to see the patient, ordering tests and/or medications, counseling the patient and coordination of care as outlined above.   JohLedell PeoplesD Department of Hematology/Oncology [facility]: [phone]: [phone]: [email or web address]  09/12/2021 3:01 PM

## 2021-09-12 ENCOUNTER — Encounter: Payer: Self-pay | Admitting: Oncology

## 2021-09-13 LAB — KAPPA/LAMBDA LIGHT CHAINS
Kappa free light chain: 9.9 mg/L (ref 3.3–19.4)
Kappa, lambda light chain ratio: 1.46 (ref 0.26–1.65)
Lambda free light chains: 6.8 mg/L (ref 5.7–26.3)

## 2021-09-16 LAB — MULTIPLE MYELOMA PANEL, SERUM
Albumin SerPl Elph-Mcnc: 3.8 g/dL (ref 2.9–4.4)
Albumin/Glob SerPl: 1.6 (ref 0.7–1.7)
Alpha 1: 0.2 g/dL (ref 0.0–0.4)
Alpha2 Glob SerPl Elph-Mcnc: 0.8 g/dL (ref 0.4–1.0)
B-Globulin SerPl Elph-Mcnc: 0.9 g/dL (ref 0.7–1.3)
Gamma Glob SerPl Elph-Mcnc: 0.5 g/dL (ref 0.4–1.8)
Globulin, Total: 2.4 g/dL (ref 2.2–3.9)
IgA: 14 mg/dL — ABNORMAL LOW (ref 87–352)
IgG (Immunoglobin G), Serum: 488 mg/dL — ABNORMAL LOW (ref 586–1602)
IgM (Immunoglobulin M), Srm: 46 mg/dL (ref 26–217)
Total Protein ELP: 6.2 g/dL (ref 6.0–8.5)

## 2021-09-24 ENCOUNTER — Inpatient Hospital Stay: Payer: 59

## 2021-09-24 ENCOUNTER — Other Ambulatory Visit: Payer: Self-pay

## 2021-09-24 VITALS — BP 110/79 | HR 75 | Temp 98.6°F | Resp 16 | Wt 136.2 lb

## 2021-09-24 DIAGNOSIS — C9000 Multiple myeloma not having achieved remission: Secondary | ICD-10-CM

## 2021-09-24 DIAGNOSIS — Z5112 Encounter for antineoplastic immunotherapy: Secondary | ICD-10-CM | POA: Diagnosis not present

## 2021-09-24 LAB — CBC WITH DIFFERENTIAL (CANCER CENTER ONLY)
Abs Immature Granulocytes: 0.05 10*3/uL (ref 0.00–0.07)
Basophils Absolute: 0.1 10*3/uL (ref 0.0–0.1)
Basophils Relative: 1 %
Eosinophils Absolute: 0.3 10*3/uL (ref 0.0–0.5)
Eosinophils Relative: 4 %
HCT: 44 % (ref 36.0–46.0)
Hemoglobin: 14.8 g/dL (ref 12.0–15.0)
Immature Granulocytes: 1 %
Lymphocytes Relative: 18 %
Lymphs Abs: 1.8 10*3/uL (ref 0.7–4.0)
MCH: 32.6 pg (ref 26.0–34.0)
MCHC: 33.6 g/dL (ref 30.0–36.0)
MCV: 96.9 fL (ref 80.0–100.0)
Monocytes Absolute: 0.7 10*3/uL (ref 0.1–1.0)
Monocytes Relative: 7 %
Neutro Abs: 6.9 10*3/uL (ref 1.7–7.7)
Neutrophils Relative %: 69 %
Platelet Count: 280 10*3/uL (ref 150–400)
RBC: 4.54 MIL/uL (ref 3.87–5.11)
RDW: 12.9 % (ref 11.5–15.5)
WBC Count: 9.8 10*3/uL (ref 4.0–10.5)
nRBC: 0 % (ref 0.0–0.2)

## 2021-09-24 LAB — CMP (CANCER CENTER ONLY)
ALT: 15 U/L (ref 0–44)
AST: 15 U/L (ref 15–41)
Albumin: 4.3 g/dL (ref 3.5–5.0)
Alkaline Phosphatase: 70 U/L (ref 38–126)
Anion gap: 7 (ref 5–15)
BUN: 20 mg/dL (ref 6–20)
CO2: 31 mmol/L (ref 22–32)
Calcium: 9.5 mg/dL (ref 8.9–10.3)
Chloride: 103 mmol/L (ref 98–111)
Creatinine: 0.79 mg/dL (ref 0.44–1.00)
GFR, Estimated: >60 mL/min (ref >60)
Glucose, Bld: 97 mg/dL (ref 70–99)
Potassium: 3.7 mmol/L (ref 3.5–5.1)
Sodium: 141 mmol/L (ref 135–145)
Total Bilirubin: 0.5 mg/dL (ref 0.3–1.2)
Total Protein: 6.5 g/dL (ref 6.5–8.1)

## 2021-09-24 LAB — LACTATE DEHYDROGENASE: LDH: 196 U/L — ABNORMAL HIGH (ref 98–192)

## 2021-09-24 MED ORDER — BORTEZOMIB CHEMO SQ INJECTION 3.5 MG (2.5MG/ML)
1.0000 mg/m2 | Freq: Once | INTRAMUSCULAR | Status: AC
  Administered 2021-09-24: 1.75 mg via SUBCUTANEOUS
  Filled 2021-09-24: qty 0.7

## 2021-09-24 MED ORDER — PROCHLORPERAZINE MALEATE 10 MG PO TABS
10.0000 mg | ORAL_TABLET | Freq: Once | ORAL | Status: AC
  Administered 2021-09-24: 10 mg via ORAL
  Filled 2021-09-24: qty 1

## 2021-09-24 NOTE — Progress Notes (Signed)
Labs reviewed, ok to treat per parameters. 

## 2021-09-24 NOTE — Patient Instructions (Signed)
[facility]ICAL [facility]COL[facility]  Discharge Instructions: Thank you for choosing [facility] to provide your oncology and hematology care.   If you have a lab appointment with the Cancer Center, please go directly to the Cancer Center and check in at the registration area.   Wear comfortable clothing and clothing appropriate for easy access to any Portacath or PICC line.   We strive to give you quality time with your provider. You may need to reschedule your appointment if you arrive late (15 or more minutes).  Arriving late affects you and other patients whose appointments are after yours.  Also, if you miss three or more appointments without notifying the office, you may be dismissed from the clinic at the provider's discretion.      For prescription refill requests, have your pharmacy contact our office and allow 72 hours for refills to be completed.    Today you received the following chemotherapy and/or immunotherapy agents Velcade       To help prevent nausea and vomiting after your treatment, we encourage you to take your nausea medication as directed.  BELOW ARE SYMPTOMS THAT SHOULD BE REPORTED IMMEDIATELY: *FEVER GREATER THAN 100.4 F (38 C) OR HIGHER *CHILLS OR SWEATING *NAUSEA AND VOMITING THAT IS NOT CONTROLLED WITH YOUR NAUSEA MEDICATION *UNUSUAL SHORTNESS OF BREATH *UNUSUAL BRUISING OR BLEEDING *URINARY PROBLEMS (pain or burning when urinating, or frequent urination) *BOWEL PROBLEMS (unusual diarrhea, constipation, pain near the anus) TENDERNESS IN MOUTH AND THROAT WITH OR WITHOUT PRESENCE OF ULCERS (sore throat, sores in mouth, or a toothache) UNUSUAL RASH, SWELLING OR PAIN  UNUSUAL VAGINAL DISCHARGE OR ITCHING   Items with * indicate a potential emergency and should be followed up as soon as possible or go to the Emergency Department if any problems should occur.  Please show the CHEMOTHERAPY ALERT CARD or IMMUNOTHERAPY ALERT CARD at check-in to  the Emergency Department and triage nurse.  Should you have questions after your visit or need to cancel or reschedule your appointment, please contact [facility]  Dept: [phone]  and follow the prompts.  Office hours are 8:00 a.m. to 4:30 p.m. Monday - Friday. Please note that voicemails left after 4:00 p.m. may not be returned until the following business day.  We are closed weekends and major holidays. You have access to a nurse at all times for urgent questions. Please call the main number to the clinic Dept: [phone] and follow the prompts.   For any non-urgent questions, you may also contact your provider using MyChart. We now offer e-Visits for anyone 18 and older to request care online for non-urgent symptoms. For details visit [email or web address].   Also download the MyChart app! Go to the app store, search "MyChart", open the app, select [facility], and log in with your MyChart username and password.  Due to Covid, a mask is required upon entering the hospital/clinic. If you do not have a mask, one will be given to you upon arrival. For doctor visits, patients may have 1 support person aged 18 or older with them. For treatment visits, patients cannot have anyone with them due to current Covid guidelines and our immunocompromised population.   

## 2021-10-08 ENCOUNTER — Inpatient Hospital Stay: Payer: 59

## 2021-10-08 ENCOUNTER — Other Ambulatory Visit: Payer: Self-pay

## 2021-10-08 ENCOUNTER — Inpatient Hospital Stay (HOSPITAL_BASED_OUTPATIENT_CLINIC_OR_DEPARTMENT_OTHER): Payer: 59 | Admitting: Hematology and Oncology

## 2021-10-08 ENCOUNTER — Inpatient Hospital Stay: Payer: 59 | Attending: Oncology

## 2021-10-08 VITALS — BP 126/81 | HR 72 | Temp 98.2°F | Resp 16 | Ht 69.0 in | Wt 138.6 lb

## 2021-10-08 DIAGNOSIS — C9000 Multiple myeloma not having achieved remission: Secondary | ICD-10-CM

## 2021-10-08 DIAGNOSIS — Z48298 Encounter for aftercare following other organ transplant: Secondary | ICD-10-CM | POA: Diagnosis not present

## 2021-10-08 DIAGNOSIS — Z5112 Encounter for antineoplastic immunotherapy: Secondary | ICD-10-CM | POA: Diagnosis not present

## 2021-10-08 DIAGNOSIS — Z79899 Other long term (current) drug therapy: Secondary | ICD-10-CM | POA: Diagnosis not present

## 2021-10-08 DIAGNOSIS — C9001 Multiple myeloma in remission: Secondary | ICD-10-CM | POA: Diagnosis present

## 2021-10-08 LAB — CBC WITH DIFFERENTIAL (CANCER CENTER ONLY)
Abs Immature Granulocytes: 0.04 10*3/uL (ref 0.00–0.07)
Basophils Absolute: 0.1 10*3/uL (ref 0.0–0.1)
Basophils Relative: 1 %
Eosinophils Absolute: 0.5 10*3/uL (ref 0.0–0.5)
Eosinophils Relative: 5 %
HCT: 47.3 % — ABNORMAL HIGH (ref 36.0–46.0)
Hemoglobin: 16.1 g/dL — ABNORMAL HIGH (ref 12.0–15.0)
Immature Granulocytes: 1 %
Lymphocytes Relative: 23 %
Lymphs Abs: 2 10*3/uL (ref 0.7–4.0)
MCH: 32.7 pg (ref 26.0–34.0)
MCHC: 34 g/dL (ref 30.0–36.0)
MCV: 96.1 fL (ref 80.0–100.0)
Monocytes Absolute: 0.9 10*3/uL (ref 0.1–1.0)
Monocytes Relative: 10 %
Neutro Abs: 5.2 10*3/uL (ref 1.7–7.7)
Neutrophils Relative %: 60 %
Platelet Count: 265 10*3/uL (ref 150–400)
RBC: 4.92 MIL/uL (ref 3.87–5.11)
RDW: 13 % (ref 11.5–15.5)
WBC Count: 8.6 10*3/uL (ref 4.0–10.5)
nRBC: 0 % (ref 0.0–0.2)

## 2021-10-08 LAB — CMP (CANCER CENTER ONLY)
ALT: 21 U/L (ref 0–44)
AST: 18 U/L (ref 15–41)
Albumin: 4.8 g/dL (ref 3.5–5.0)
Alkaline Phosphatase: 78 U/L (ref 38–126)
Anion gap: 6 (ref 5–15)
BUN: 23 mg/dL — ABNORMAL HIGH (ref 6–20)
CO2: 32 mmol/L (ref 22–32)
Calcium: 10 mg/dL (ref 8.9–10.3)
Chloride: 102 mmol/L (ref 98–111)
Creatinine: 0.81 mg/dL (ref 0.44–1.00)
GFR, Estimated: >60 mL/min (ref >60)
Glucose, Bld: 70 mg/dL (ref 70–99)
Potassium: 4 mmol/L (ref 3.5–5.1)
Sodium: 140 mmol/L (ref 135–145)
Total Bilirubin: 0.5 mg/dL (ref 0.3–1.2)
Total Protein: 7.1 g/dL (ref 6.5–8.1)

## 2021-10-08 LAB — LACTATE DEHYDROGENASE: LDH: 192 U/L (ref 98–192)

## 2021-10-08 MED ORDER — BORTEZOMIB CHEMO SQ INJECTION 3.5 MG (2.5MG/ML)
1.0000 mg/m2 | Freq: Once | INTRAMUSCULAR | Status: AC
  Administered 2021-10-08: 1.75 mg via SUBCUTANEOUS
  Filled 2021-10-08: qty 0.7

## 2021-10-08 MED ORDER — ZOLEDRONIC ACID 4 MG/100ML IV SOLN
4.0000 mg | Freq: Once | INTRAVENOUS | Status: AC
  Administered 2021-10-08: 4 mg via INTRAVENOUS
  Filled 2021-10-08: qty 100

## 2021-10-08 MED ORDER — PROCHLORPERAZINE MALEATE 10 MG PO TABS: 10.0000 mg | ORAL_TABLET | Freq: Four times a day (QID) | ORAL | 0 refills | Status: AC | PRN

## 2021-10-08 MED ORDER — PROCHLORPERAZINE MALEATE 10 MG PO TABS
10.0000 mg | ORAL_TABLET | Freq: Once | ORAL | Status: AC
  Administered 2021-10-08: 10 mg via ORAL
  Filled 2021-10-08: qty 1

## 2021-10-08 MED ORDER — SODIUM CHLORIDE 0.9 % IV SOLN: Freq: Once | INTRAVENOUS | Status: AC

## 2021-10-08 MED ORDER — PROCHLORPERAZINE MALEATE 5 MG PO TABS: 10.0000 mg | ORAL_TABLET | Freq: Four times a day (QID) | ORAL | 1 refills | Status: DC | PRN

## 2021-10-08 NOTE — Patient Instructions (Signed)
[facility]  Discharge Instructions: Thank you for choosing [facility] to provide your oncology and hematology care.   If you have a lab appointment with the [facility], please go directly to the [facility] and check in at the registration area.   Wear comfortable clothing and clothing appropriate for easy access to any Portacath or PICC line.   We strive to give you quality time with your provider. You may need to reschedule your appointment if you arrive late (15 or more minutes).  Arriving late affects you and other patients whose appointments are after yours.  Also, if you miss three or more appointments without notifying the office, you may be dismissed from the clinic at the providers discretion.      For prescription refill requests, have your pharmacy contact our office and allow 72 hours for refills to be completed.    Today you received the following chemotherapy and/or immunotherapy agents: Zometa / Velcade      To help prevent nausea and vomiting after your treatment, we encourage you to take your nausea medication as directed.  BELOW ARE SYMPTOMS THAT SHOULD BE REPORTED IMMEDIATELY: *FEVER GREATER THAN 100.4 F (38 C) OR HIGHER *CHILLS OR SWEATING *NAUSEA AND VOMITING THAT IS NOT CONTROLLED WITH YOUR NAUSEA MEDICATION *UNUSUAL SHORTNESS OF BREATH *UNUSUAL BRUISING OR BLEEDING *URINARY PROBLEMS (pain or burning when urinating, or frequent urination) *BOWEL PROBLEMS (unusual diarrhea, constipation, pain near the anus) TENDERNESS IN MOUTH AND THROAT WITH OR WITHOUT PRESENCE OF ULCERS (sore throat, sores in mouth, or a toothache) UNUSUAL RASH, SWELLING OR PAIN  UNUSUAL VAGINAL DISCHARGE OR ITCHING   Items with * indicate a potential emergency and should be followed up as soon as possible or go to the Emergency Department if any problems should occur.  Please show the CHEMOTHERAPY ALERT CARD or IMMUNOTHERAPY ALERT CARD at  check-in to the Emergency Department and triage nurse.  Should you have questions after your visit or need to cancel or reschedule your appointment, please contact [facility]  Dept: [phone]  and follow the prompts.  Office hours are 8:00 a.m. to 4:30 p.m. Monday - Friday. Please note that voicemails left after 4:00 p.m. may not be returned until the following business day.  We are closed weekends and major holidays. You have access to a nurse at all times for urgent questions. Please call the main number to the clinic Dept: [phone] and follow the prompts.   For any non-urgent questions, you may also contact your provider using MyChart. We now offer e-Visits for anyone 61 and older to request care online for non-urgent symptoms. For details visit [email or web address].   Also download the MyChart app! Go to the app store, search "MyChart", open the app, select [facility], and log in with your MyChart username and password.  Due to Covid, a mask is required upon entering the hospital/clinic. If you do not have a mask, one will be given to you upon arrival. For doctor visits, patients may have 1 support Reign Bartnick aged 59 or older with them. For treatment visits, patients cannot have anyone with them due to current Covid guidelines and our immunocompromised population.   Zoledronic Acid Injection (Hypercalcemia, Oncology) What is this medication? ZOLEDRONIC ACID (ZOE le dron ik AS id) slows calcium loss from bones. It high calcium levels in the blood from some kinds of cancer. It may be used in other people at risk for bone loss. This  medicine may be used for other purposes; ask your health care provider or pharmacist if you have questions. COMMON BRAND NAME(S): Zometa What should I tell my care team before I take this medication? They need to know if you have any of these conditions: cancer dehydration dental disease kidney disease liver disease low levels  of calcium in the blood lung or breathing disease (asthma) receiving steroids like dexamethasone or prednisone an unusual or allergic reaction to zoledronic acid, other medicines, foods, dyes, or preservatives pregnant or trying to get pregnant breast-feeding How should I use this medication? This drug is injected into a vein. It is given by a health care provider in a hospital or clinic setting. Talk to your health care provider about the use of this drug in children. Special care may be needed. Overdosage: If you think you have taken too much of this medicine contact a poison control center or emergency room at once. NOTE: This medicine is only for you. Do not share this medicine with others. What if I miss a dose? Keep appointments for follow-up doses. It is important not to miss your dose. Call your health care provider if you are unable to keep an appointment. What may interact with this medication? certain antibiotics given by injection NSAIDs, medicines for pain and inflammation, like ibuprofen or naproxen some diuretics like bumetanide, furosemide teriparatide thalidomide This list may not describe all possible interactions. Give your health care provider a list of all the medicines, herbs, non-prescription drugs, or dietary supplements you use. Also tell them if you smoke, drink alcohol, or use illegal drugs. Some items may interact with your medicine. What should I watch for while using this medication? Visit your health care provider for regular checks on your progress. It may be some time before you see the benefit from this drug. Some people who take this drug have severe bone, joint, or muscle pain. This drug may also increase your risk for jaw problems or a broken thigh bone. Tell your health care provider right away if you have severe pain in your jaw, bones, joints, or muscles. Tell you health care provider if you have any pain that does not go away or that gets worse. Tell  your dentist and dental surgeon that you are taking this drug. You should not have major dental surgery while on this drug. See your dentist to have a dental exam and fix any dental problems before starting this drug. Take good care of your teeth while on this drug. Make sure you see your dentist for regular follow-up appointments. You should make sure you get enough calcium and vitamin D while you are taking this drug. Discuss the foods you eat and the vitamins you take with your health care provider. Check with your health care provider if you have severe diarrhea, nausea, and vomiting, or if you sweat a lot. The loss of too much body fluid may make it dangerous for you to take this drug. You may need blood work done while you are taking this drug. Do not become pregnant while taking this drug. Women should inform their health care provider if they wish to become pregnant or think they might be pregnant. There is potential for serious harm to an unborn child. Talk to your health care provider for more information. What side effects may I notice from receiving this medication? Side effects that you should report to your doctor or health care provider as soon as possible: allergic reactions (skin rash, itching  or hives; swelling of the face, lips, or tongue) bone pain infection (fever, chills, cough, sore throat, pain or trouble passing urine) jaw pain, especially after dental work joint pain kidney injury (trouble passing urine or change in the amount of urine) low blood pressure (dizziness; feeling faint or lightheaded, falls; unusually weak or tired) low calcium levels (fast heartbeat; muscle cramps or pain; pain, tingling, or numbness in the hands or feet; seizures) low magnesium levels (fast, irregular heartbeat; muscle cramp or pain; muscle weakness; tremors; seizures) low red blood cell counts (trouble breathing; feeling faint; lightheaded, falls; unusually weak or tired) muscle  pain redness, blistering, peeling, or loosening of the skin, including inside the mouth severe diarrhea swelling of the ankles, feet, hands trouble breathing Side effects that usually do not require medical attention (report to your doctor or health care provider if they continue or are bothersome): anxious constipation coughing depressed mood eye irritation, itching, or pain fever general ill feeling or flu-like symptoms nausea pain, redness, or irritation at site where injected trouble sleeping This list may not describe all possible side effects. Call your doctor for medical advice about side effects. You may report side effects to FDA at 1-800-FDA-1088. Where should I keep my medication? This drug is given in a hospital or clinic. It will not be stored at home. NOTE: This sheet is a summary. It may not cover all possible information. If you have questions about this medicine, talk to your doctor, pharmacist, or health care provider.  2022 [employer]/Gold Standard (2021-05-04 00:00:00)  Bortezomib injection What is this medication? BORTEZOMIB (bor TEZ oh mib) targets proteins in cancer cells and stops the cancer cells from growing. It treats multiple myeloma and mantle cell lymphoma. This medicine may be used for other purposes; ask your health care provider or pharmacist if you have questions. COMMON BRAND NAME(S): Velcade What should I tell my care team before I take this medication? They need to know if you have any of these conditions: dehydration diabetes (high blood sugar) heart disease liver disease tingling of the fingers or toes or other nerve disorder an unusual or allergic reaction to bortezomib, mannitol, boron, other medicines, foods, dyes, or preservatives pregnant or trying to get pregnant breast-feeding How should I use this medication? This medicine is injected into a vein or under the skin. It is given by a health care provider in a hospital or clinic  setting. Talk to your health care provider about the use of this medicine in children. Special care may be needed. Overdosage: If you think you have taken too much of this medicine contact a poison control center or emergency room at once. NOTE: This medicine is only for you. Do not share this medicine with others. What if I miss a dose? Keep appointments for follow-up doses. It is important not to miss your dose. Call your health care provider if you are unable to keep an appointment. What may interact with this medication? This medicine may interact with the following medications: ketoconazole rifampin This list may not describe all possible interactions. Give your health care provider a list of all the medicines, herbs, non-prescription drugs, or dietary supplements you use. Also tell them if you smoke, drink alcohol, or use illegal drugs. Some items may interact with your medicine. What should I watch for while using this medication? Your condition will be monitored carefully while you are receiving this medicine. You may need blood work done while you are taking this medicine. You may get drowsy  or dizzy. Do not drive, use machinery, or do anything that needs mental alertness until you know how this medicine affects you. Do not stand up or sit up quickly, especially if you are an older patient. This reduces the risk of dizzy or fainting spells This medicine may increase your risk of getting an infection. Call your health care provider for advice if you get a fever, chills, sore throat, or other symptoms of a cold or flu. Do not treat yourself. Try to avoid being around people who are sick. Check with your health care provider if you have severe diarrhea, nausea, and vomiting, or if you sweat a lot. The loss of too much body fluid may make it dangerous for you to take this medicine. Do not become pregnant while taking this medicine or for 7 months after stopping it. Women should inform their  health care provider if they wish to become pregnant or think they might be pregnant. Men should not father a child while taking this medicine and for 4 months after stopping it. There is a potential for serious harm to an unborn child. Talk to your health care provider for more information. Do not breast-feed an infant while taking this medicine or for 2 months after stopping it. This medicine may make it more difficult to get pregnant or father a child. Talk to your health care provider if you are concerned about your fertility. What side effects may I notice from receiving this medication? Side effects that you should report to your doctor or health care professional as soon as possible: allergic reactions (skin rash; itching or hives; swelling of the face, lips, or tongue) bleeding (bloody or black, tarry stools; red or dark brown urine; spitting up blood or brown material that looks like coffee grounds; red spots on the skin; unusual bruising or bleeding from the eye, gums, or nose) blurred vision or changes in vision confusion constipation headache heart failure (trouble breathing; fast, irregular heartbeat; sudden weight gain; swelling of the ankles, feet, hands) infection (fever, chills, cough, sore throat, pain or trouble passing urine) lack or loss of appetite liver injury (dark yellow or brown urine; general ill feeling or flu-like symptoms; loss of appetite, right upper belly pain; yellowing of the eyes or skin) low blood pressure (dizziness; feeling faint or lightheaded, falls; unusually weak or tired) muscle cramps pain, redness, or irritation at site where injected pain, tingling, numbness in the hands or feet seizures trouble breathing unusual bruising or bleeding Side effects that usually do not require medical attention (report to your doctor or health care professional if they continue or are bothersome): diarrhea nausea stomach pain trouble sleeping vomiting This list  may not describe all possible side effects. Call your doctor for medical advice about side effects. You may report side effects to FDA at 1-800-FDA-1088. Where should I keep my medication? This medicine is given in a hospital or clinic. It will not be stored at home. NOTE: This sheet is a summary. It may not cover all possible information. If you have questions about this medicine, talk to your doctor, pharmacist, or health care provider.  2022 [employer]/Gold Standard (2020-08-06 00:00:00)

## 2021-10-09 ENCOUNTER — Encounter: Payer: Self-pay | Admitting: Oncology

## 2021-10-09 NOTE — Progress Notes (Signed)
Robin [phone]   [phone]  PROGRESS NOTE  Patient Care Team: Maurice Small, MD as PCP - General (Family Medicine) Magrinat, Virgie Dad, MD (Inactive) as Consulting Physician (Oncology) Latanya Maudlin, MD as Consulting Physician (Orthopedic Surgery) Everlene Farrier, MD as Consulting Physician (Obstetrics and Gynecology) Domingo Pulse, MD (Urology) Ronald Lobo, MD as Consulting Physician (Gastroenterology) Markus Daft, MD as Consulting Physician (Interventional Radiology) Erline Levine, MD as Consulting Physician (Neurosurgery) Aris Lot Ramon Dredge, MD as Referring Physician (Hematology and Oncology) Milda Smart, MD as Referring Physician (Diagnostic Radiology)  Hematological/Oncological History # Multiple Myeloma in Remission s/p Bone Marrow Transplant  1) 02/22/2020: presented with Low Back Pain. MRI concerning for wedge fractures/compression fractures  2) 03/05/2020 Myeloma panel shows no M protein but all immunoglobulins are decreased.  The lambda ratio is markedly abnormal at 131.34; review of the blood film is not diagnostic 3) 24-hour urine: markedly elevated Kappa light chains at 9,656; urine kappa/lambda ratio 4180.24, and urine M spike of 239. 4)  bone survey on 03/16/2020 shows rounded lucencies throughout the skull which could be venous lakes  5)  bone marrow biopsy on 7/27 confirms multiple myeloma with 44% plasma cells on the aspirate by manual differential, and 70 to 80% on the biopsy by CD138 immunohistochemistry 6) cytogenetics  03/24/2020: no metaphase cells available for analysis  7)  myeloma FISH studies 03/24/2020 show t(11,14), with NO del(17p), t(1/14) or t(14.16) 8)  on 04/08/2020 LDH 240, beta-2-microglobulin 4.4, albumin 4.2 9)  R-ISS Stage: II    10) bortezomib/dexamethasone started 03/31/2020, most recent dose 10/28/2020             (a) lenalidomide added on 04/21/2020, stopped on 8/26 after two doses  secondary to rash, resumed 04/29/2020 again with a very poor tolerance, treated with Medrol Dosepak, third attempt 05/14/2020, again unable to tolerate              (b) thalidomide added 06/04/2020 to bortezomib/ dexamethasone 05/27/2020, discontinued 06/24/2020 with rash             (c) daratumumab added 07/08/2020, most recent dose 10/21/2020              11) denosumab/Xgeva started 05/06/2020, repeated every 3 months   12) s/p kyphoplasty to T7, T11 and T12 on 06/09/2020 at [facility]   13) s/p high-dose therapy with autologous stem cell rescue 11/23/2020 at [facility]             (a) stem cell reinfusion 11/24/2020             (b) white cell engraftment 12/05/2020, platelet engraftment 12/11/2020             (c) Day +100 bone marrow evaluation normocellular with no increase in plasma cells, however MRD positive.              (d) PET 03/09/2021 showed no FDG-avid bone or other disease; increased tracer uptake in left atrial wall led to cardiac consultation   14) maintenance bortezomib indicated             (a) start of maintenance therapy delayed by post-transplantCHF                         (i) echo 02/2021 showed an ejection fraction in the 20-25% range, following with Dr. Beau Fanny  Interval History:  Robin Wilson 61 y.o. female with medical history significant for multiple myeloma in  remission status post stem cell transplant who presents for a follow up visit. The patient's last visit was on 09/10/2021. In the interim since the last visit she has continued on every 2 week bortezomib maintenance therapy.  On exam today Robin Wilson reports she has been tolerating the Velcade shots quite well.  She notes that some days she can feel rough on Saturday the day after her shot.  She notes that she will be 1 year out from transplant in April 2023.  She does occasionally have some loose stools but no frank diarrhea.  She is also not having any numbness or tingling concerning for neuropathy in  the hands or feet.  She denies any rash, itching, or pain at the site of the injection.  She otherwise denies any fevers, chills, sweats, nausea, vomiting or diarrhea.  Full 10 point ROS is listed below.  MEDICAL HISTORY:  Past Medical History:  Diagnosis Date   Hypertension    Kidney stones    Kidney stones     SURGICAL HISTORY: Past Surgical History:  Procedure Laterality Date   CESAREAN SECTION     CYSTOSCOPY WITH RETROGRADE PYELOGRAM, URETEROSCOPY AND STENT PLACEMENT Right 09/16/2015   Procedure: CYSTOSCOPY WITH RETROGRADE PYELOGRAM, URETEROSCOPY AND STENT PLACEMENT WITH HOLMIUM LASER ABLATION ;  Surgeon: Alexis Frock, MD;  Location: [facility];  Service: Urology;  Laterality: Right;   CYSTOSCOPY/RETROGRADE/URETEROSCOPY  06/04/2012   Procedure: CYSTOSCOPY/RETROGRADE/URETEROSCOPY;  Surgeon: Ailene Rud, MD;  Location: [facility];  Service: Urology;  Laterality: Left;   IR RADIOLOGIST EVAL & MGMT  04/29/2020   UTERINE FIBROID EMBOLIZATION      SOCIAL HISTORY: Social History   Socioeconomic History   Marital status: Married    Spouse name: Not on file   Number of children: Not on file   Years of education: Not on file   Highest education level: Not on file  Occupational History   Not on file  Tobacco Use   Smoking status: Never   Smokeless tobacco: Never  Substance and Sexual Activity   Alcohol use: No   Drug use: No   Sexual activity: Never  Other Topics Concern   Not on file  Social History Narrative   Not on file   Social Determinants of Health   Financial Resource Strain: Not on file  Food Insecurity: Not on file  Transportation Needs: Not on file  Physical Activity: Not on file  Stress: Not on file  Social Connections: Not on file  Intimate Partner Violence: Not on file    FAMILY HISTORY: Family History  Problem Relation Age of Onset   Breast cancer Neg Hx     ALLERGIES:  is allergic to ace inhibitors, revlimid [lenalidomide], other, sulfa drugs  cross reactors, and sulfamethoxazole.  MEDICATIONS:  Current Outpatient Medications  Medication Sig Dispense Refill   prochlorperazine (COMPAZINE) 10 MG tablet Take 1 tablet (10 mg total) by mouth every 6 (six) hours as needed for nausea or vomiting. 30 tablet 0   acyclovir (ZOVIRAX) 400 MG tablet Take 1 tablet (400 mg total) by mouth 2 (two) times daily. 180 tablet 3   albuterol (VENTOLIN HFA) 108 (90 Base) MCG/ACT inhaler Inhale 2 puffs into the lungs every 4 (four) hours as needed.     BREO ELLIPTA 200-25 MCG/ACT AEPB Inhale 1 puff into the lungs daily.     calcium carbonate (OS-CAL - DOSED IN MG OF ELEMENTAL CALCIUM) 1250 MG tablet Take 1 tablet by mouth daily.  clobetasol cream (TEMOVATE) 0.05 %      clonazePAM (KLONOPIN) 0.5 MG tablet Take 0.5 mg by mouth 2 (two) times daily as needed.     ENTRESTO 24-26 MG Take 1 tablet by mouth 2 (two) times daily.     folic acid (FOLVITE) 1 MG tablet Take 1 tablet (1 mg total) by mouth daily. 90 tablet 3   hydrocortisone 2.5 % ointment Apply to the eczema rash on the face once or twice daily as needed.     hydrOXYzine (ATARAX/VISTARIL) 10 MG tablet Take 1 tablet (10 mg total) by mouth every 4 (four) hours as needed for anxiety. 30 tablet 0   JARDIANCE 10 MG TABS tablet Take 10 mg by mouth daily.     metoprolol succinate (TOPROL-XL) 25 MG 24 hr tablet Take 12.5 mg by mouth daily.     Multiple Vitamins-Minerals (MULTIVITAMIN PO) Take 1 tablet by mouth daily.      predniSONE (DELTASONE) 5 MG tablet 1 tablet     sertraline (ZOLOFT) 50 MG tablet Take 1.5 tablets (75 mg total) by mouth daily. 135 tablet 1   spironolactone (ALDACTONE) 25 MG tablet Take 25 mg by mouth daily.     traZODone (DESYREL) 50 MG tablet Take 50 mg by mouth at bedtime as needed.     No current facility-administered medications for this visit.    REVIEW OF SYSTEMS:   Constitutional: ( - ) fevers, ( - )  chills , ( - ) night sweats Eyes: ( - ) blurriness of vision, ( - )  double vision, ( - ) watery eyes Ears, nose, mouth, throat, and face: ( - ) mucositis, ( - ) sore throat Respiratory: ( - ) cough, ( - ) dyspnea, ( - ) wheezes Cardiovascular: ( - ) palpitation, ( - ) chest discomfort, ( - ) lower extremity swelling Gastrointestinal:  ( - ) nausea, ( - ) heartburn, ( - ) change in bowel habits Skin: ( - ) abnormal skin rashes Lymphatics: ( - ) new lymphadenopathy, ( - ) easy bruising Neurological: ( - ) numbness, ( - ) tingling, ( - ) new weaknesses Behavioral/Psych: ( - ) mood change, ( - ) new changes  All other systems were reviewed with the patient and are negative.  PHYSICAL EXAMINATION: ECOG PERFORMANCE STATUS: 1 - Symptomatic but completely ambulatory  Vitals:   10/08/21 1343  BP: 126/81  Pulse: 72  Resp: 16  Temp: 98.2 F (36.8 C)  SpO2: 98%    Filed Weights   10/08/21 1343  Weight: 138 lb 9.6 oz (62.9 kg)     GENERAL: Well-appearing middle-aged Caucasian female alert, no distress and comfortable SKIN: skin color, texture, turgor are normal, no rashes or significant lesions EYES: conjunctiva are pink and non-injected, sclera clear LUNGS: clear to auscultation and percussion with normal breathing effort HEART: regular rate & rhythm and no murmurs and no lower extremity edema Musculoskeletal: no cyanosis of digits and no clubbing  PSYCH: alert & oriented x 3, fluent speech NEURO: no focal motor/sensory deficits  LABORATORY DATA:  I have reviewed the data as listed CBC Latest Ref Rng & Units 10/08/2021 09/24/2021 09/10/2021  WBC 4.0 - 10.5 K/uL 8.6 9.8 9.3  Hemoglobin 12.0 - 15.0 g/dL 16.1(H) 14.8 14.3  Hematocrit 36.0 - 46.0 % 47.3(H) 44.0 42.4  Platelets 150 - 400 K/uL 265 280 245    CMP Latest Ref Rng & Units 10/08/2021 09/24/2021 09/10/2021  Glucose 70 - 99 mg/dL 70 97 78  BUN 6 - 20 mg/dL 23(H) 20 21(H)  Creatinine 0.44 - 1.00 mg/dL 0.81 0.79 0.87  Sodium 135 - 145 mmol/L 140 141 140  Potassium 3.5 - 5.1 mmol/L 4.0 3.7 3.6   Chloride 98 - 111 mmol/L 102 103 103  CO2 22 - 32 mmol/L 32 31 31  Calcium 8.9 - 10.3 mg/dL 10.0 9.5 9.7  Total Protein 6.5 - 8.1 g/dL 7.1 6.5 6.6  Total Bilirubin 0.3 - 1.2 mg/dL 0.5 0.5 0.4  Alkaline Phos 38 - 126 U/L 78 70 69  AST 15 - 41 U/L _0 ALT 0 - 44 U/L _1 Lab Results  Component Value Date   MPROTEIN Not Observed 09/10/2021   MPROTEIN Not Observed 08/13/2021   MPROTEIN Not Observed 07/16/2021   Lab Results  Component Value Date   KPAFRELGTCHN 9.9 09/10/2021   KPAFRELGTCHN 7.9 08/13/2021   KPAFRELGTCHN 5.8 07/16/2021   LAMBDASER 6.8 09/10/2021   LAMBDASER 6.1 08/13/2021   LAMBDASER 2.3 (L) 07/16/2021   KAPLAMBRATIO 1.46 09/10/2021   KAPLAMBRATIO 1.30 08/13/2021   KAPLAMBRATIO 2.52 (H) 07/16/2021    RADIOGRAPHIC STUDIES: No results found.  ASSESSMENT & PLAN Robin Wilson 61 y.o. female with medical history significant for multiple myeloma in remission status post stem cell transplant who presents for a follow up visit.  After review of labs, review the records, discussion with the patient her findings are most consistent with multiple myeloma in remission status post autosomal stem cell transplant at [facility].  She was treated there by Robin Wilson.  Unfortunately during the course of her treatment she was found to be intolerant to Revlimid due to rash/hives.  As such she is been on maintenance with bortezomib shots every 2 weeks.  We will continue this therapy moving forward as we assume her care from Robin Wilson.  At this time her M protein is undetectable and her blood counts are stable.  Last MM staging labs on 09/10/2021 showed undetectable M protein and kappa 9.9, lambda 6.8, and a ratio of 1.46  # Multiple Myeloma in Remission s/p ASCT -- We will continue the patient's current maintenance plan of bortezomib subcutaneous every 2 weeks as maintenance therapy. -- Patient is not a candidate for Revlimid maintenance therapy as  she developed hives/rash with this treatment --Defer to [facility] for administration of vaccines and vaccine timeline -- Last restaging labs on 09/10/2021 showed undetectable M protein and kappa 9.9, lambda 6.8, and a ratio of 1.46 --We will continue every 4 week visits with restaging labs and interval every 2 week bortezomib treatments.   #Supportive Care -- zofran 9m q8H PRN and compazine 166mPO q6H for nausea -- acyclovir 40036mID PO for VCZ prophylaxis -- zometa q 3 monthly infusions x 2 years. Last infusion in Nov 2022.  -- No pain medication required at this time.   No orders of the defined types were placed in this encounter.  All questions were answered. The patient knows to call the clinic with any problems, questions or concerns.  A total of more than 30 minutes were spent on this encounter with face-to-face time and non-face-to-face time, including preparing to see the patient, ordering tests and/or medications, counseling the patient and coordination of care as outlined above.   Robin Wilson Department of Hematology/Oncology [facility]: [phone]: [phone]: [email or web address]  10/09/2021 5:19 PM

## 2021-10-11 ENCOUNTER — Telehealth: Payer: Self-pay | Admitting: Hematology and Oncology

## 2021-10-11 LAB — KAPPA/LAMBDA LIGHT CHAINS
Kappa free light chain: 8.9 mg/L (ref 3.3–19.4)
Kappa, lambda light chain ratio: 2.17 — ABNORMAL HIGH (ref 0.26–1.65)
Lambda free light chains: 4.1 mg/L — ABNORMAL LOW (ref 5.7–26.3)

## 2021-10-11 NOTE — Telephone Encounter (Signed)
Scheduled per 2/10 los, pt has been called and confirmed appts

## 2021-10-12 LAB — MULTIPLE MYELOMA PANEL, SERUM
Albumin SerPl Elph-Mcnc: 4.2 g/dL (ref 2.9–4.4)
Albumin/Glob SerPl: 1.7 (ref 0.7–1.7)
Alpha 1: 0.2 g/dL (ref 0.0–0.4)
Alpha2 Glob SerPl Elph-Mcnc: 0.7 g/dL (ref 0.4–1.0)
B-Globulin SerPl Elph-Mcnc: 1 g/dL (ref 0.7–1.3)
Gamma Glob SerPl Elph-Mcnc: 0.6 g/dL (ref 0.4–1.8)
Globulin, Total: 2.5 g/dL (ref 2.2–3.9)
IgA: 17 mg/dL — ABNORMAL LOW (ref 87–352)
IgG (Immunoglobin G), Serum: 583 mg/dL — ABNORMAL LOW (ref 586–1602)
IgM (Immunoglobulin M), Srm: 42 mg/dL (ref 26–217)
M Protein SerPl Elph-Mcnc: 0.2 g/dL — ABNORMAL HIGH
Total Protein ELP: 6.7 g/dL (ref 6.0–8.5)

## 2021-10-14 ENCOUNTER — Other Ambulatory Visit: Payer: Self-pay | Admitting: Oncology

## 2021-10-22 ENCOUNTER — Other Ambulatory Visit: Payer: Self-pay

## 2021-10-22 ENCOUNTER — Inpatient Hospital Stay: Payer: 59

## 2021-10-22 VITALS — BP 120/76 | HR 68 | Temp 98.9°F | Resp 17 | Wt 140.5 lb

## 2021-10-22 DIAGNOSIS — Z5112 Encounter for antineoplastic immunotherapy: Secondary | ICD-10-CM | POA: Diagnosis not present

## 2021-10-22 DIAGNOSIS — C9000 Multiple myeloma not having achieved remission: Secondary | ICD-10-CM

## 2021-10-22 LAB — CMP (CANCER CENTER ONLY)
ALT: 16 U/L (ref 0–44)
AST: 16 U/L (ref 15–41)
Albumin: 4.4 g/dL (ref 3.5–5.0)
Alkaline Phosphatase: 64 U/L (ref 38–126)
Anion gap: 4 — ABNORMAL LOW (ref 5–15)
BUN: 20 mg/dL (ref 6–20)
CO2: 34 mmol/L — ABNORMAL HIGH (ref 22–32)
Calcium: 9.9 mg/dL (ref 8.9–10.3)
Chloride: 101 mmol/L (ref 98–111)
Creatinine: 0.76 mg/dL (ref 0.44–1.00)
GFR, Estimated: >60 mL/min (ref >60)
Glucose, Bld: 61 mg/dL — ABNORMAL LOW (ref 70–99)
Potassium: 4 mmol/L (ref 3.5–5.1)
Sodium: 139 mmol/L (ref 135–145)
Total Bilirubin: 0.5 mg/dL (ref 0.3–1.2)
Total Protein: 6.4 g/dL — ABNORMAL LOW (ref 6.5–8.1)

## 2021-10-22 LAB — CBC WITH DIFFERENTIAL (CANCER CENTER ONLY)
Abs Immature Granulocytes: 0.04 10*3/uL (ref 0.00–0.07)
Basophils Absolute: 0.1 10*3/uL (ref 0.0–0.1)
Basophils Relative: 1 %
Eosinophils Absolute: 0.3 10*3/uL (ref 0.0–0.5)
Eosinophils Relative: 3 %
HCT: 43.7 % (ref 36.0–46.0)
Hemoglobin: 14.7 g/dL (ref 12.0–15.0)
Immature Granulocytes: 0 %
Lymphocytes Relative: 22 %
Lymphs Abs: 2 10*3/uL (ref 0.7–4.0)
MCH: 32.8 pg (ref 26.0–34.0)
MCHC: 33.6 g/dL (ref 30.0–36.0)
MCV: 97.5 fL (ref 80.0–100.0)
Monocytes Absolute: 0.9 10*3/uL (ref 0.1–1.0)
Monocytes Relative: 10 %
Neutro Abs: 5.8 10*3/uL (ref 1.7–7.7)
Neutrophils Relative %: 64 %
Platelet Count: 295 10*3/uL (ref 150–400)
RBC: 4.48 MIL/uL (ref 3.87–5.11)
RDW: 13.2 % (ref 11.5–15.5)
WBC Count: 9 10*3/uL (ref 4.0–10.5)
nRBC: 0 % (ref 0.0–0.2)

## 2021-10-22 LAB — LACTATE DEHYDROGENASE: LDH: 222 U/L — ABNORMAL HIGH (ref 98–192)

## 2021-10-22 MED ORDER — PROCHLORPERAZINE MALEATE 10 MG PO TABS
10.0000 mg | ORAL_TABLET | Freq: Once | ORAL | Status: AC
  Administered 2021-10-22: 10 mg via ORAL
  Filled 2021-10-22: qty 1

## 2021-10-22 MED ORDER — BORTEZOMIB CHEMO SQ INJECTION 3.5 MG (2.5MG/ML)
1.0000 mg/m2 | Freq: Once | INTRAMUSCULAR | Status: AC
  Administered 2021-10-22: 1.75 mg via SUBCUTANEOUS
  Filled 2021-10-22: qty 0.7

## 2021-11-05 ENCOUNTER — Other Ambulatory Visit: Payer: Self-pay

## 2021-11-05 ENCOUNTER — Inpatient Hospital Stay: Payer: 59

## 2021-11-05 ENCOUNTER — Inpatient Hospital Stay (HOSPITAL_BASED_OUTPATIENT_CLINIC_OR_DEPARTMENT_OTHER): Payer: 59 | Admitting: Physician Assistant

## 2021-11-05 ENCOUNTER — Inpatient Hospital Stay: Payer: 59 | Attending: Oncology

## 2021-11-05 VITALS — BP 121/72 | HR 78 | Temp 97.9°F | Resp 18 | Ht 69.0 in | Wt 143.0 lb

## 2021-11-05 DIAGNOSIS — C9001 Multiple myeloma in remission: Secondary | ICD-10-CM | POA: Insufficient documentation

## 2021-11-05 DIAGNOSIS — Z79899 Other long term (current) drug therapy: Secondary | ICD-10-CM | POA: Insufficient documentation

## 2021-11-05 DIAGNOSIS — C9000 Multiple myeloma not having achieved remission: Secondary | ICD-10-CM

## 2021-11-05 DIAGNOSIS — Z5112 Encounter for antineoplastic immunotherapy: Secondary | ICD-10-CM | POA: Diagnosis not present

## 2021-11-05 LAB — CBC WITH DIFFERENTIAL (CANCER CENTER ONLY)
Abs Immature Granulocytes: 0.04 10*3/uL (ref 0.00–0.07)
Basophils Absolute: 0.1 10*3/uL (ref 0.0–0.1)
Basophils Relative: 1 %
Eosinophils Absolute: 0.3 10*3/uL (ref 0.0–0.5)
Eosinophils Relative: 3 %
HCT: 45.2 % (ref 36.0–46.0)
Hemoglobin: 15 g/dL (ref 12.0–15.0)
Immature Granulocytes: 0 %
Lymphocytes Relative: 20 %
Lymphs Abs: 1.9 10*3/uL (ref 0.7–4.0)
MCH: 32.5 pg (ref 26.0–34.0)
MCHC: 33.2 g/dL (ref 30.0–36.0)
MCV: 97.8 fL (ref 80.0–100.0)
Monocytes Absolute: 0.8 10*3/uL (ref 0.1–1.0)
Monocytes Relative: 9 %
Neutro Abs: 6.3 10*3/uL (ref 1.7–7.7)
Neutrophils Relative %: 67 %
Platelet Count: 280 10*3/uL (ref 150–400)
RBC: 4.62 MIL/uL (ref 3.87–5.11)
RDW: 13.3 % (ref 11.5–15.5)
WBC Count: 9.3 10*3/uL (ref 4.0–10.5)
nRBC: 0 % (ref 0.0–0.2)

## 2021-11-05 LAB — CMP (CANCER CENTER ONLY)
ALT: 17 U/L (ref 0–44)
AST: 16 U/L (ref 15–41)
Albumin: 4.6 g/dL (ref 3.5–5.0)
Alkaline Phosphatase: 69 U/L (ref 38–126)
Anion gap: 5 (ref 5–15)
BUN: 17 mg/dL (ref 6–20)
CO2: 34 mmol/L — ABNORMAL HIGH (ref 22–32)
Calcium: 10 mg/dL (ref 8.9–10.3)
Chloride: 101 mmol/L (ref 98–111)
Creatinine: 0.76 mg/dL (ref 0.44–1.00)
GFR, Estimated: >60 mL/min (ref >60)
Glucose, Bld: 73 mg/dL (ref 70–99)
Potassium: 3.6 mmol/L (ref 3.5–5.1)
Sodium: 140 mmol/L (ref 135–145)
Total Bilirubin: 0.6 mg/dL (ref 0.3–1.2)
Total Protein: 6.7 g/dL (ref 6.5–8.1)

## 2021-11-05 LAB — LACTATE DEHYDROGENASE: LDH: 228 U/L — ABNORMAL HIGH (ref 98–192)

## 2021-11-05 MED ORDER — BORTEZOMIB CHEMO SQ INJECTION 3.5 MG (2.5MG/ML)
1.0000 mg/m2 | Freq: Once | INTRAMUSCULAR | Status: AC
  Administered 2021-11-05: 1.75 mg via SUBCUTANEOUS
  Filled 2021-11-05: qty 0.7

## 2021-11-05 MED ORDER — PROCHLORPERAZINE MALEATE 10 MG PO TABS
10.0000 mg | ORAL_TABLET | Freq: Once | ORAL | Status: AC
  Administered 2021-11-05: 10 mg via ORAL
  Filled 2021-11-05: qty 1

## 2021-11-05 NOTE — Progress Notes (Signed)
[place] [phone]   [phone]  PROGRESS NOTE  Patient Care Team: Maurice Small, MD as PCP - General (Family Medicine) Magrinat, Virgie Dad, MD (Inactive) as Consulting Physician (Oncology) Latanya Maudlin, MD as Consulting Physician (Orthopedic Surgery) Everlene Farrier, MD as Consulting Physician (Obstetrics and Gynecology) Domingo Pulse, MD (Urology) Ronald Lobo, MD as Consulting Physician (Gastroenterology) Markus Daft, MD as Consulting Physician (Interventional Radiology) Erline Levine, MD as Consulting Physician (Neurosurgery) Aris Lot Ramon Dredge, MD as Referring Physician (Hematology and Oncology) Milda Smart, MD as Referring Physician (Diagnostic Radiology)  Hematological/Oncological History # Multiple Myeloma in Remission s/p Bone Marrow Transplant  1) 02/22/2020: presented with Low Back Pain. MRI concerning for wedge fractures/compression fractures  2) 03/05/2020 Myeloma panel shows no M protein but all immunoglobulins are decreased.  The lambda ratio is markedly abnormal at 131.34; review of the blood film is not diagnostic 3) 24-hour urine: markedly elevated Kappa light chains at 9,656; urine kappa/lambda ratio 4180.24, and urine M spike of 239. 4)  bone survey on 03/16/2020 shows rounded lucencies throughout the skull which could be venous lakes  5)  bone marrow biopsy on 7/27 confirms multiple myeloma with 44% plasma cells on the aspirate by manual differential, and 70 to 80% on the biopsy by CD138 immunohistochemistry 6) cytogenetics  03/24/2020: no metaphase cells available for analysis  7)  myeloma FISH studies 03/24/2020 show t(11,14), with NO del(17p), t(1/14) or t(14.16) 8)  on 04/08/2020 LDH 240, beta-2-microglobulin 4.4, albumin 4.2 9)  R-ISS Stage: II    10) bortezomib/dexamethasone started 03/31/2020, most recent dose 10/28/2020             (a) lenalidomide added on 04/21/2020, stopped on 8/26 after two doses  secondary to rash, resumed 04/29/2020 again with a very poor tolerance, treated with Medrol Dosepak, third attempt 05/14/2020, again unable to tolerate              (b) thalidomide added 06/04/2020 to bortezomib/ dexamethasone 05/27/2020, discontinued 06/24/2020 with rash             (c) daratumumab added 07/08/2020, most recent dose 10/21/2020              11) denosumab/Xgeva started 05/06/2020, repeated every 3 months   12) s/p kyphoplasty to T7, T11 and T12 on 06/09/2020 at [facility]   13) s/p high-dose therapy with autologous stem cell rescue 11/23/2020 at [facility]             (a) stem cell reinfusion 11/24/2020             (b) white cell engraftment 12/05/2020, platelet engraftment 12/11/2020             (c) Day +100 bone marrow evaluation normocellular with no increase in plasma cells, however MRD positive.              (d) PET 03/09/2021 showed no FDG-avid bone or other disease; increased tracer uptake in left atrial wall led to cardiac consultation   14) maintenance bortezomib indicated             (a) start of maintenance therapy delayed by post-transplantCHF                         (i) echo 02/2021 showed an ejection fraction in the 20-25% range, following with Dr. Beau Fanny  Interval History:  Robin Wilson 61 y.o. female with medical history significant for multiple myeloma in  remission status post stem cell transplant who presents for a follow up visit. The patient's last visit was on 10/08/2021. In the interim since the last visit she has continued on every 2 week bortezomib maintenance therapy.  On exam today Robin Wilson continues to tolerate Velcade shots quite well. She reports that her fatigue has increased in the last 2-3 weeks but she continues to complete her daily ADLs on her own. She has a good appetite without any recent weight loss. She denies any nausea, vomiting or abdominal pain. She has occasional episodes of loose stools but otherwise no evidence of diarrhea  or constipation. She denies any fevers, chills, night sweats, shortness of breath, chest pain, cough, neuropathy, rash or other skin changes. She has no other complaints.  Full 10 point ROS is listed below.  MEDICAL HISTORY:  Past Medical History:  Diagnosis Date   Hypertension    Kidney stones    Kidney stones     SURGICAL HISTORY: Past Surgical History:  Procedure Laterality Date   CESAREAN SECTION     CYSTOSCOPY WITH RETROGRADE PYELOGRAM, URETEROSCOPY AND STENT PLACEMENT Right 09/16/2015   Procedure: CYSTOSCOPY WITH RETROGRADE PYELOGRAM, URETEROSCOPY AND STENT PLACEMENT WITH HOLMIUM LASER ABLATION ;  Surgeon: Alexis Frock, MD;  Location: [facility];  Service: Urology;  Laterality: Right;   CYSTOSCOPY/RETROGRADE/URETEROSCOPY  06/04/2012   Procedure: CYSTOSCOPY/RETROGRADE/URETEROSCOPY;  Surgeon: Ailene Rud, MD;  Location: [facility];  Service: Urology;  Laterality: Left;   IR RADIOLOGIST EVAL & MGMT  04/29/2020   UTERINE FIBROID EMBOLIZATION      SOCIAL HISTORY: Social History   Socioeconomic History   Marital status: Married    Spouse name: Not on file   Number of children: Not on file   Years of education: Not on file   Highest education level: Not on file  Occupational History   Not on file  Tobacco Use   Smoking status: Never   Smokeless tobacco: Never  Substance and Sexual Activity   Alcohol use: No   Drug use: No   Sexual activity: Never  Other Topics Concern   Not on file  Social History Narrative   Not on file   Social Determinants of Health   Financial Resource Strain: Not on file  Food Insecurity: Not on file  Transportation Needs: Not on file  Physical Activity: Not on file  Stress: Not on file  Social Connections: Not on file  Intimate Partner Violence: Not on file    FAMILY HISTORY: Family History  Problem Relation Age of Onset   Breast cancer Neg Hx     ALLERGIES:  is allergic to ace inhibitors, revlimid [lenalidomide], other, sulfa drugs  cross reactors, and sulfamethoxazole.  MEDICATIONS:  Current Outpatient Medications  Medication Sig Dispense Refill   acyclovir (ZOVIRAX) 400 MG tablet Take 1 tablet (400 mg total) by mouth 2 (two) times daily. 180 tablet 3   albuterol (VENTOLIN HFA) 108 (90 Base) MCG/ACT inhaler Inhale 2 puffs into the lungs every 4 (four) hours as needed.     BREO ELLIPTA 200-25 MCG/ACT AEPB Inhale 1 puff into the lungs daily.     calcium carbonate (OS-CAL - DOSED IN MG OF ELEMENTAL CALCIUM) 1250 MG tablet Take 1 tablet by mouth daily.       clobetasol cream (TEMOVATE) 0.05 %      clonazePAM (KLONOPIN) 0.5 MG tablet Take 0.5 mg by mouth 2 (two) times daily as needed.     ENTRESTO 24-26 MG Take 1 tablet by mouth  2 (two) times daily.     folic acid (FOLVITE) 1 MG tablet Take 1 tablet (1 mg total) by mouth daily. 90 tablet 3   hydrocortisone 2.5 % ointment Apply to the eczema rash on the face once or twice daily as needed.     hydrOXYzine (ATARAX/VISTARIL) 10 MG tablet Take 1 tablet (10 mg total) by mouth every 4 (four) hours as needed for anxiety. 30 tablet 0   JARDIANCE 10 MG TABS tablet Take 10 mg by mouth daily.     metoprolol succinate (TOPROL-XL) 25 MG 24 hr tablet Take 12.5 mg by mouth daily.     Multiple Vitamins-Minerals (MULTIVITAMIN PO) Take 1 tablet by mouth daily.      predniSONE (DELTASONE) 5 MG tablet 1 tablet     prochlorperazine (COMPAZINE) 10 MG tablet Take 1 tablet (10 mg total) by mouth every 6 (six) hours as needed for nausea or vomiting. 30 tablet 0   sertraline (ZOLOFT) 50 MG tablet Take 1.5 tablets (75 mg total) by mouth daily. 135 tablet 1   spironolactone (ALDACTONE) 25 MG tablet Take 25 mg by mouth daily.     traZODone (DESYREL) 50 MG tablet Take 50 mg by mouth at bedtime as needed.     No current facility-administered medications for this visit.    REVIEW OF SYSTEMS:   Constitutional: ( - ) fevers, ( - )  chills , ( - ) night sweats Eyes: ( - ) blurriness of vision, ( - )  double vision, ( - ) watery eyes Ears, nose, mouth, throat, and face: ( - ) mucositis, ( - ) sore throat Respiratory: ( - ) cough, ( - ) dyspnea, ( - ) wheezes Cardiovascular: ( - ) palpitation, ( - ) chest discomfort, ( - ) lower extremity swelling Gastrointestinal:  ( - ) nausea, ( - ) heartburn, ( - ) change in bowel habits Skin: ( - ) abnormal skin rashes Lymphatics: ( - ) new lymphadenopathy, ( - ) easy bruising Neurological: ( - ) numbness, ( - ) tingling, ( - ) new weaknesses Behavioral/Psych: ( - ) mood change, ( - ) new changes  All other systems were reviewed with the patient and are negative.  PHYSICAL EXAMINATION: ECOG PERFORMANCE STATUS: 1 - Symptomatic but completely ambulatory  Vitals:   11/05/21 1259  BP: 121/72  Pulse: 78  Resp: 18  Temp: 97.9 F (36.6 C)  SpO2: 96%    Filed Weights   11/05/21 1259  Weight: 143 lb (64.9 kg)     GENERAL: Well-appearing middle-aged Caucasian female alert, no distress and comfortable SKIN: skin color, texture, turgor are normal, no rashes or significant lesions EYES: conjunctiva are pink and non-injected, sclera clear LUNGS: clear to auscultation and percussion with normal breathing effort HEART: regular rate & rhythm and no murmurs and no lower extremity edema Musculoskeletal: no cyanosis of digits and no clubbing  PSYCH: alert & oriented x 3, fluent speech NEURO: no focal motor/sensory deficits  LABORATORY DATA:  I have reviewed the data as listed CBC Latest Ref Rng & Units 11/05/2021 10/22/2021 10/08/2021  WBC 4.0 - 10.5 K/uL 9.3 9.0 8.6  Hemoglobin 12.0 - 15.0 g/dL 15.0 14.7 16.1(H)  Hematocrit 36.0 - 46.0 % 45.2 43.7 47.3(H)  Platelets 150 - 400 K/uL 280 295 265    CMP Latest Ref Rng & Units 11/05/2021 10/22/2021 10/08/2021  Glucose 70 - 99 mg/dL 73 61(L) 70  BUN 6 - 20 mg/dL 17 20 23(H)  Creatinine 0.44 -  1.00 mg/dL 0.76 0.76 0.81  Sodium 135 - 145 mmol/L 140 139 140  Potassium 3.5 - 5.1 mmol/L 3.6 4.0 4.0  Chloride  98 - 111 mmol/L 101 101 102  CO2 22 - 32 mmol/L 34(H) 34(H) 32  Calcium 8.9 - 10.3 mg/dL 10.0 9.9 10.0  Total Protein 6.5 - 8.1 g/dL 6.7 6.4(L) 7.1  Total Bilirubin 0.3 - 1.2 mg/dL 0.6 0.5 0.5  Alkaline Phos 38 - 126 U/L 69 64 78  AST 15 - 41 U/L $Remo'16 16 18  'WftYJ$ ALT 0 - 44 U/L $Remo'17 16 21    'GplJs$ Lab Results  Component Value Date   MPROTEIN 0.2 (H) 10/08/2021   MPROTEIN Not Observed 09/10/2021   MPROTEIN Not Observed 08/13/2021   Lab Results  Component Value Date   KPAFRELGTCHN 8.9 10/08/2021   KPAFRELGTCHN 9.9 09/10/2021   KPAFRELGTCHN 7.9 08/13/2021   LAMBDASER 4.1 (L) 10/08/2021   LAMBDASER 6.8 09/10/2021   LAMBDASER 6.1 08/13/2021   KAPLAMBRATIO 2.17 (H) 10/08/2021   KAPLAMBRATIO 1.46 09/10/2021   KAPLAMBRATIO 1.30 08/13/2021    RADIOGRAPHIC STUDIES: No results found.  ASSESSMENT & PLAN Robin Wilson 61 y.o. female with medical history significant for multiple myeloma in remission status post autosomal stem cell transplant at [facility].  She was treated there by Dr. Quentin Ore.  Unfortunately during the course of her treatment she was found to be intolerant to Revlimid due to rash/hives.  As such she is been on maintenance with bortezomib shots every 2 weeks.  We will continue this therapy moving forward as we assume her care from Dr. Jana Hakim.    # Multiple Myeloma in Remission s/p ASCT -- We will continue the patient's current maintenance plan of bortezomib subcutaneous every 2 weeks as maintenance therapy. -- Patient is not a candidate for Revlimid maintenance therapy as she developed hives/rash with this treatment --Defer to [facility] for administration of vaccines and vaccine timeline -- Last restaging labs on 10/08/2021 did detect M protein measuring 0.2 g/dL and kappa .9, lambda 4.1 and a ratio of 2.17. --Labs today were reviewed without any intervention needed. MM panel and sFLC pending.  --If there is evidence of persistent M-protein from  today's SPEP, we will reach out to Dr. Aris Lot from [facility] to discuss next steps. She is scheduled for a follow up on 11/30/2021.  --We will continue every 4 week visits with restaging labs and interval every 2 week bortezomib treatments.   #Supportive Care -- zofran $RemoveB'8mg'ynvZXybX$  q8H PRN and compazine $RemoveBefor'10mg'GIpmSXSygIvl$  PO q6H for nausea -- acyclovir $RemoveBefo'400mg'pnqPWgIHnwh$  BID PO for VCZ prophylaxis -- zometa q 3 monthly infusions x 2 years. Last infusion in Nov 2022.  -- No pain medication required at this time.   No orders of the defined types were placed in this encounter.  All questions were answered. The patient knows to call the clinic with any problems, questions or concerns.  I have spent a total of 30 minutes minutes of face-to-face and non-face-to-face time, preparing to see the patient, performing a medically appropriate examination, counseling and educating the patient, ordering medications/tests,  documenting clinical information in the electronic health record, and care coordination.   Dede Query PA-C Dept of Hematology and [place] at [facility] Phone: [phone]   11/07/2021 3:52 PM

## 2021-11-07 ENCOUNTER — Encounter: Payer: Self-pay | Admitting: Hematology and Oncology

## 2021-11-07 ENCOUNTER — Encounter: Payer: Self-pay | Admitting: Oncology

## 2021-11-08 ENCOUNTER — Telehealth: Payer: Self-pay | Admitting: *Deleted

## 2021-11-08 LAB — MULTIPLE MYELOMA PANEL, SERUM
Albumin SerPl Elph-Mcnc: 4 g/dL (ref 2.9–4.4)
Albumin/Glob SerPl: 1.7 (ref 0.7–1.7)
Alpha 1: 0.2 g/dL (ref 0.0–0.4)
Alpha2 Glob SerPl Elph-Mcnc: 0.7 g/dL (ref 0.4–1.0)
B-Globulin SerPl Elph-Mcnc: 1 g/dL (ref 0.7–1.3)
Gamma Glob SerPl Elph-Mcnc: 0.6 g/dL (ref 0.4–1.8)
Globulin, Total: 2.4 g/dL (ref 2.2–3.9)
IgA: 18 mg/dL — ABNORMAL LOW (ref 87–352)
IgG (Immunoglobin G), Serum: 553 mg/dL — ABNORMAL LOW (ref 586–1602)
IgM (Immunoglobulin M), Srm: 26 mg/dL (ref 26–217)
Total Protein ELP: 6.4 g/dL (ref 6.0–8.5)

## 2021-11-08 LAB — KAPPA/LAMBDA LIGHT CHAINS
Kappa free light chain: 8.9 mg/L (ref 3.3–19.4)
Kappa, lambda light chain ratio: 2.47 — ABNORMAL HIGH (ref 0.26–1.65)
Lambda free light chains: 3.6 mg/L — ABNORMAL LOW (ref 5.7–26.3)

## 2021-11-08 NOTE — Telephone Encounter (Signed)
-----   Message from Lincoln Brigham, PA-C sent at 11/08/2021  1:55 PM EDT ----- ?Please notify patient that M protein is not detected. Continue to monitor for now and get Velcade every 2 weeks ?----- Message ----- ?From: Interface, Lab In [place] ?Sent: 11/05/2021   1:02 PM EDT ?To: Orson Slick, MD ? ? ?

## 2021-11-08 NOTE — Telephone Encounter (Signed)
TCT patient regarding recent multiple myeloma panel results. Spoke with her and advised that her M spike is once again undetectable. Advised that the Kappa/Lambda light chains are not back yet.  Pt asked when or if she would go on Darzelex and velcade instead of just Velcade. Advised that she would need to discuss that with Dr. Lorenso Courier when she sees him the 1st week of April. Advised that for now we will keep to the maintenance Velcade. Sharine voiced understanding ?

## 2021-11-19 ENCOUNTER — Inpatient Hospital Stay: Payer: 59

## 2021-11-19 ENCOUNTER — Other Ambulatory Visit: Payer: Self-pay

## 2021-11-19 VITALS — BP 119/61 | HR 65 | Temp 98.8°F | Resp 18 | Wt 144.0 lb

## 2021-11-19 DIAGNOSIS — Z5112 Encounter for antineoplastic immunotherapy: Secondary | ICD-10-CM | POA: Diagnosis not present

## 2021-11-19 DIAGNOSIS — C9000 Multiple myeloma not having achieved remission: Secondary | ICD-10-CM

## 2021-11-19 LAB — CMP (CANCER CENTER ONLY)
ALT: 19 U/L (ref 0–44)
AST: 20 U/L (ref 15–41)
Albumin: 4.1 g/dL (ref 3.5–5.0)
Alkaline Phosphatase: 70 U/L (ref 38–126)
Anion gap: 6 (ref 5–15)
BUN: 15 mg/dL (ref 6–20)
CO2: 30 mmol/L (ref 22–32)
Calcium: 9.6 mg/dL (ref 8.9–10.3)
Chloride: 107 mmol/L (ref 98–111)
Creatinine: 0.83 mg/dL (ref 0.44–1.00)
GFR, Estimated: >60 mL/min (ref >60)
Glucose, Bld: 68 mg/dL — ABNORMAL LOW (ref 70–99)
Potassium: 3.6 mmol/L (ref 3.5–5.1)
Sodium: 143 mmol/L (ref 135–145)
Total Bilirubin: 0.5 mg/dL (ref 0.3–1.2)
Total Protein: 6.2 g/dL — ABNORMAL LOW (ref 6.5–8.1)

## 2021-11-19 LAB — CBC WITH DIFFERENTIAL (CANCER CENTER ONLY)
Abs Immature Granulocytes: 0.02 10*3/uL (ref 0.00–0.07)
Basophils Absolute: 0.1 10*3/uL (ref 0.0–0.1)
Basophils Relative: 2 %
Eosinophils Absolute: 0.4 10*3/uL (ref 0.0–0.5)
Eosinophils Relative: 7 %
HCT: 41.5 % (ref 36.0–46.0)
Hemoglobin: 14.1 g/dL (ref 12.0–15.0)
Immature Granulocytes: 0 %
Lymphocytes Relative: 30 %
Lymphs Abs: 1.7 10*3/uL (ref 0.7–4.0)
MCH: 32.9 pg (ref 26.0–34.0)
MCHC: 34 g/dL (ref 30.0–36.0)
MCV: 97 fL (ref 80.0–100.0)
Monocytes Absolute: 0.7 10*3/uL (ref 0.1–1.0)
Monocytes Relative: 12 %
Neutro Abs: 2.7 10*3/uL (ref 1.7–7.7)
Neutrophils Relative %: 49 %
Platelet Count: 246 10*3/uL (ref 150–400)
RBC: 4.28 MIL/uL (ref 3.87–5.11)
RDW: 13.4 % (ref 11.5–15.5)
WBC Count: 5.6 10*3/uL (ref 4.0–10.5)
nRBC: 0 % (ref 0.0–0.2)

## 2021-11-19 LAB — LACTATE DEHYDROGENASE: LDH: 227 U/L — ABNORMAL HIGH (ref 98–192)

## 2021-11-19 MED ORDER — BORTEZOMIB CHEMO SQ INJECTION 3.5 MG (2.5MG/ML)
1.0000 mg/m2 | Freq: Once | INTRAMUSCULAR | Status: AC
  Administered 2021-11-19: 1.75 mg via SUBCUTANEOUS
  Filled 2021-11-19: qty 0.7

## 2021-11-19 MED ORDER — PROCHLORPERAZINE MALEATE 10 MG PO TABS
10.0000 mg | ORAL_TABLET | Freq: Once | ORAL | Status: AC
  Administered 2021-11-19: 10 mg via ORAL
  Filled 2021-11-19: qty 1

## 2021-11-19 NOTE — Patient Instructions (Signed)
[facility]  Discharge Instructions: Thank you for choosing [facility] to provide your oncology and hematology care.   If you have a lab appointment with the Cancer Center, please go directly to the Cancer Center and check in at the registration area.   Wear comfortable clothing and clothing appropriate for easy access to any Portacath or PICC line.   We strive to give you quality time with your provider. You may need to reschedule your appointment if you arrive late (15 or more minutes).  Arriving late affects you and other patients whose appointments are after yours.  Also, if you miss three or more appointments without notifying the office, you may be dismissed from the clinic at the provider's discretion.      For prescription refill requests, have your pharmacy contact our office and allow 72 hours for refills to be completed.    Today you received the following chemotherapy and/or immunotherapy agents Velcade       To help prevent nausea and vomiting after your treatment, we encourage you to take your nausea medication as directed.  BELOW ARE SYMPTOMS THAT SHOULD BE REPORTED IMMEDIATELY: *FEVER GREATER THAN 100.4 F (38 C) OR HIGHER *CHILLS OR SWEATING *NAUSEA AND VOMITING THAT IS NOT CONTROLLED WITH YOUR NAUSEA MEDICATION *UNUSUAL SHORTNESS OF BREATH *UNUSUAL BRUISING OR BLEEDING *URINARY PROBLEMS (pain or burning when urinating, or frequent urination) *BOWEL PROBLEMS (unusual diarrhea, constipation, pain near the anus) TENDERNESS IN MOUTH AND THROAT WITH OR WITHOUT PRESENCE OF ULCERS (sore throat, sores in mouth, or a toothache) UNUSUAL RASH, SWELLING OR PAIN  UNUSUAL VAGINAL DISCHARGE OR ITCHING   Items with * indicate a potential emergency and should be followed up as soon as possible or go to the Emergency Department if any problems should occur.  Please show the CHEMOTHERAPY ALERT CARD or IMMUNOTHERAPY ALERT CARD at check-in to  the Emergency Department and triage nurse.  Should you have questions after your visit or need to cancel or reschedule your appointment, please contact [facility]  Dept: [phone]  and follow the prompts.  Office hours are 8:00 a.m. to 4:30 p.m. Monday - Friday. Please note that voicemails left after 4:00 p.m. may not be returned until the following business day.  We are closed weekends and major holidays. You have access to a nurse at all times for urgent questions. Please call the main number to the clinic Dept: [phone] and follow the prompts.   For any non-urgent questions, you may also contact your provider using MyChart. We now offer e-Visits for anyone 18 and older to request care online for non-urgent symptoms. For details visit [email or web address].   Also download the MyChart app! Go to the app store, search "MyChart", open the app, select [place], and log in with your MyChart username and password.  Due to Covid, a mask is required upon entering the hospital/clinic. If you do not have a mask, one will be given to you upon arrival. For doctor visits, patients may have 1 support person aged 18 or older with them. For treatment visits, patients cannot have anyone with them due to current Covid guidelines and our immunocompromised population.   

## 2021-12-01 ENCOUNTER — Telehealth: Payer: Self-pay | Admitting: *Deleted

## 2021-12-01 NOTE — Telephone Encounter (Signed)
Received call from pt. She states she saw Dr. Aris Lot @ [facility] yesterday and asked him if she could get her maintenance Velcade at their [facility] office as she lives in [place].  She was told she could. She is very appreciative of all her care here, but as she lives in [place] and is working more, this change will save her time. Wished her the best of luck and continued good health. Told her she is welcome to come back here at any time. ?

## 2021-12-03 ENCOUNTER — Other Ambulatory Visit: Payer: 59

## 2021-12-03 ENCOUNTER — Ambulatory Visit: Payer: 59 | Admitting: Hematology and Oncology

## 2021-12-03 ENCOUNTER — Ambulatory Visit: Payer: 59

## 2021-12-15 IMAGING — MG MM DIGITAL SCREENING BILAT W/ TOMO AND CAD
8 series · 9 of 24 positions shown · non-contrast
Comparison: Previous exam(s).

CLINICAL DATA: Screening.

EXAM:
DIGITAL SCREENING BILATERAL MAMMOGRAM WITH TOMOSYNTHESIS AND CAD
TECHNIQUE: Bilateral screening digital craniocaudal and mediolateral oblique
mammograms were obtained. Bilateral screening digital breast
tomosynthesis was performed. The images were evaluated with
computer-aided detection.

[L CC synth-2D]
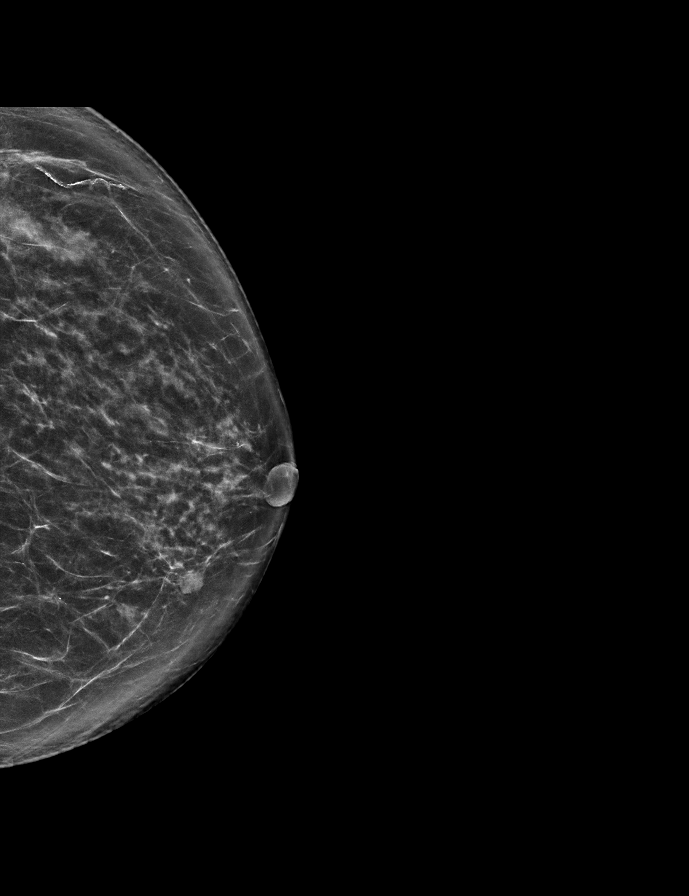

[L MLO synth-2D]
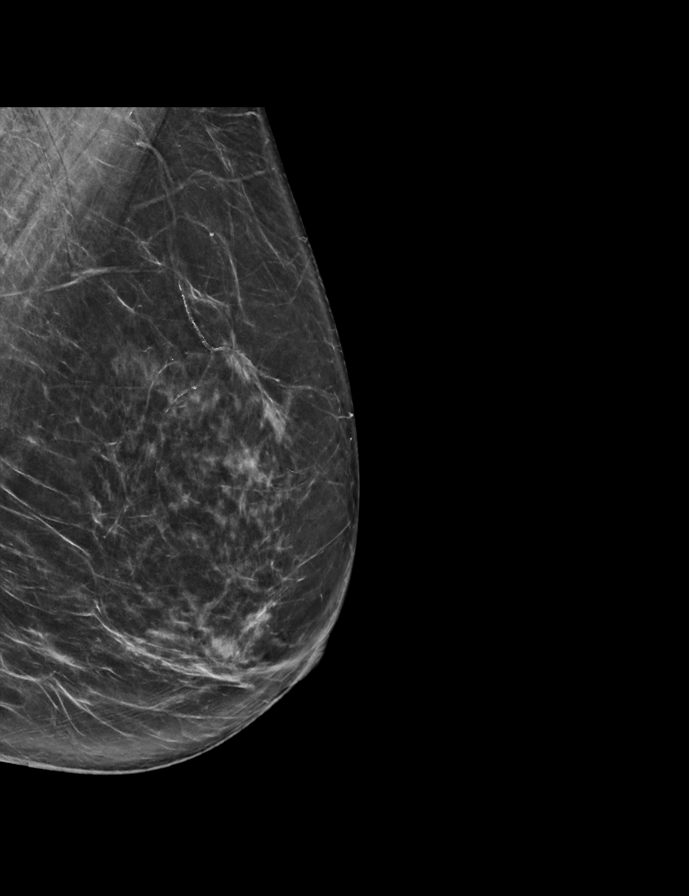

[R CC synth-2D]
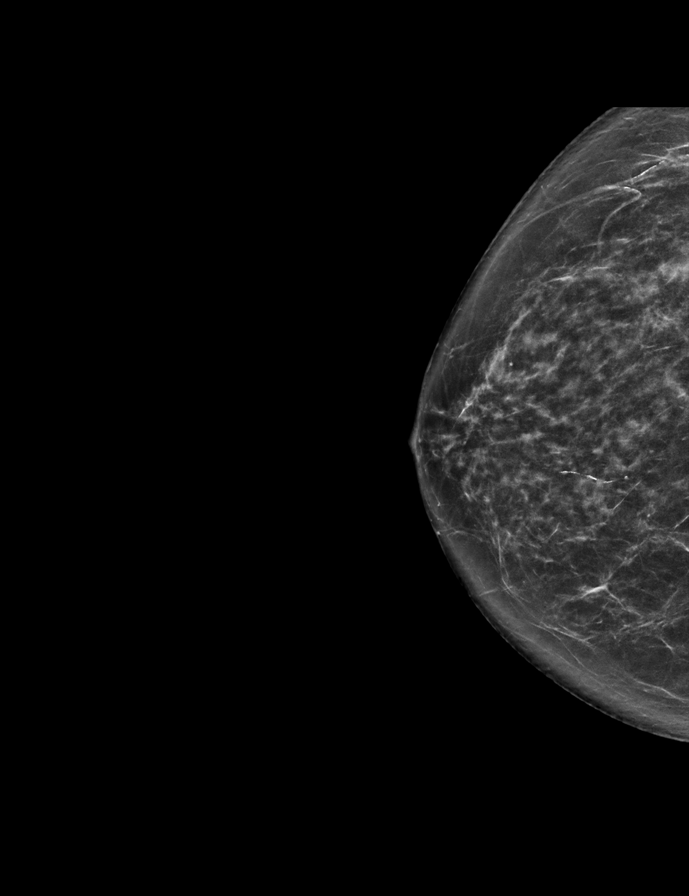

[R MLO synth-2D]
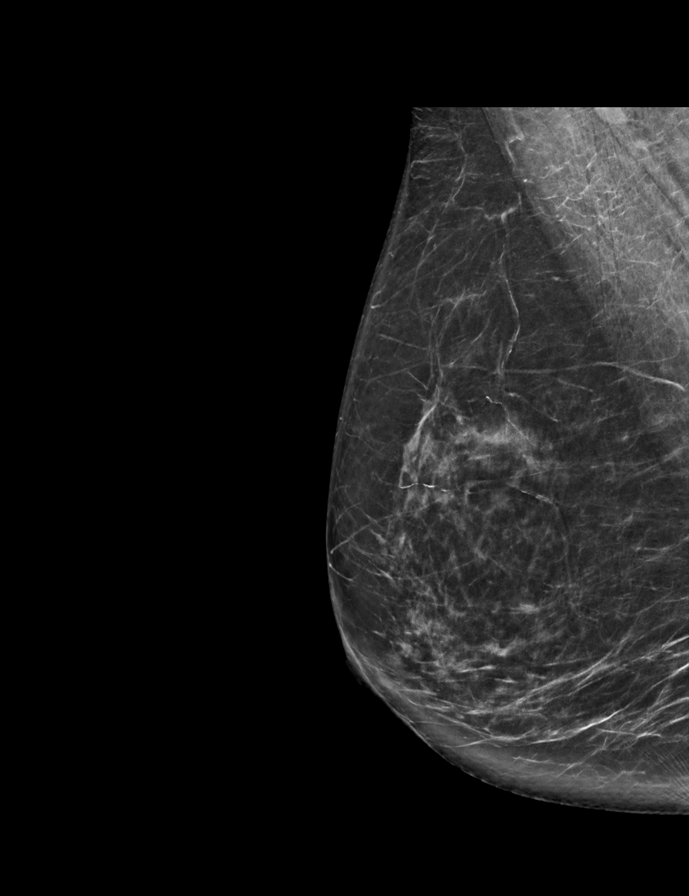

[R CC tomo · 2 of 63 frames shown]
[frame 21/63]
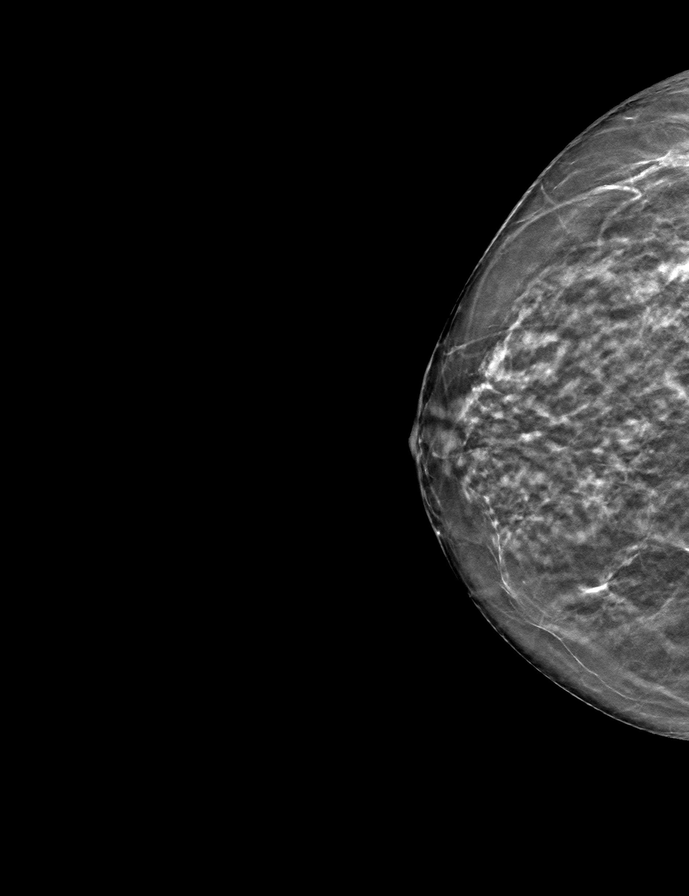
[frame 32/63]
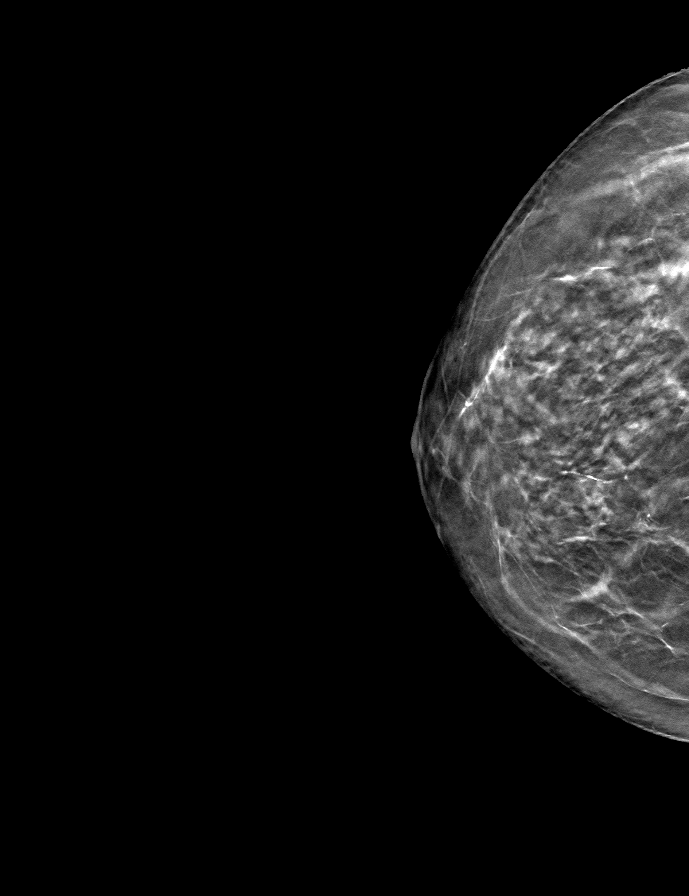

[L CC tomo · tomo slice 35/70.0]
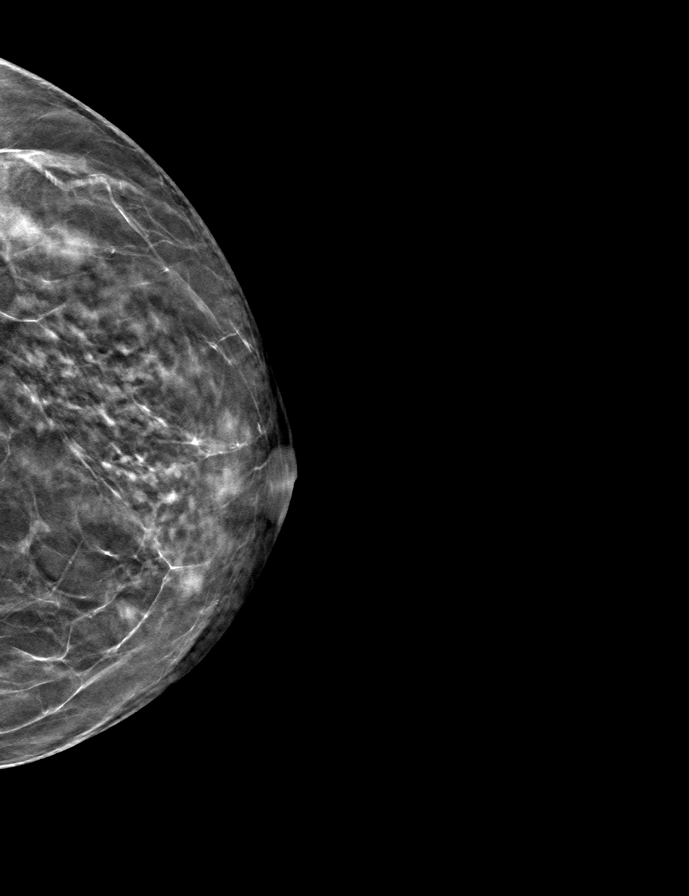

[R MLO tomo · tomo slice 37/73.0]
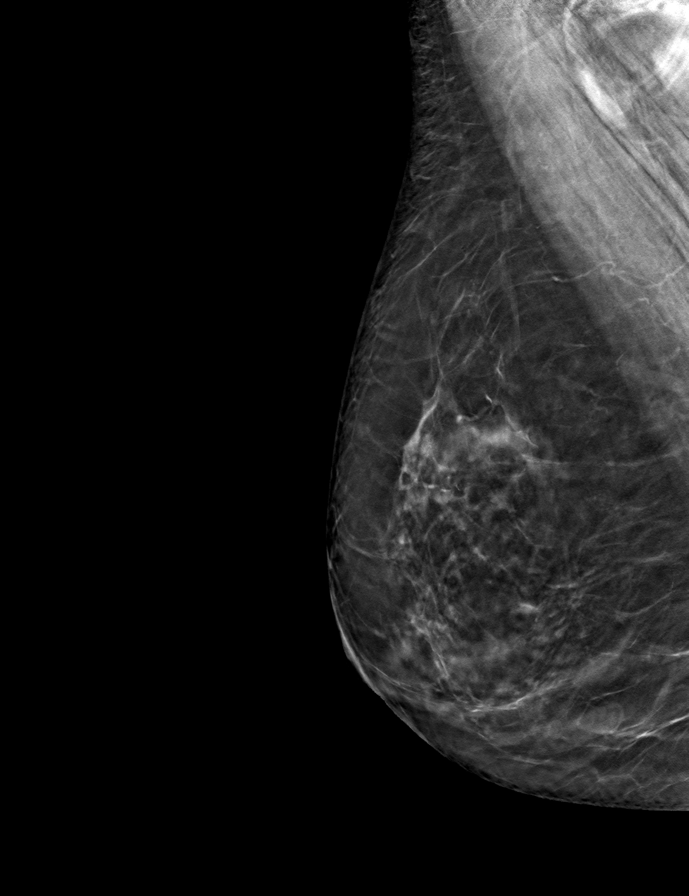

[L MLO tomo · tomo slice 37/74.0]
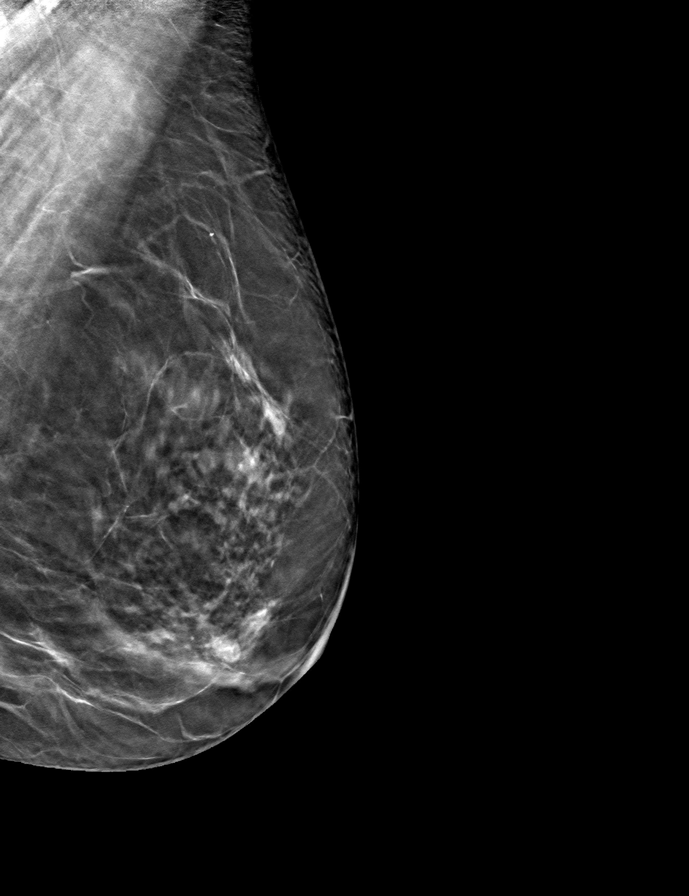

[9 of 24 positions shown; findings below may reference images not displayed]

ACR Breast Density Category b: There are scattered areas of
fibroglandular density.
FINDINGS: There are no findings suspicious for malignancy.
IMPRESSION: No mammographic evidence of malignancy. A result letter of this
screening mammogram will be mailed directly to the patient.

RECOMMENDATION:
Screening mammogram in one year. (Code:51-O-LD2)

BI-RADS CATEGORY  1: Negative.

## 2022-03-21 ENCOUNTER — Other Ambulatory Visit: Payer: Self-pay

## 2022-08-26 ENCOUNTER — Other Ambulatory Visit: Payer: Self-pay | Admitting: Hematology and Oncology

## 2022-08-30 ENCOUNTER — Other Ambulatory Visit: Payer: Self-pay

## 2023-02-21 ENCOUNTER — Other Ambulatory Visit: Payer: Self-pay | Admitting: Neurology

## 2023-02-22 ENCOUNTER — Other Ambulatory Visit: Payer: Self-pay

## 2024-02-23 ENCOUNTER — Encounter (HOSPITAL_COMMUNITY): Payer: Self-pay | Admitting: Interventional Radiology

## 2024-03-01 ENCOUNTER — Other Ambulatory Visit: Payer: Self-pay

## 2024-04-25 ENCOUNTER — Other Ambulatory Visit: Payer: Self-pay | Admitting: *Deleted
